# Patient Record
Sex: Male | Born: 1990 | Race: White | Hispanic: No | Smoking: Current every day smoker
Health system: Southern US, Community
[De-identification: ages and names within clinical notes are randomized; demographics above are authoritative.]

## PROBLEM LIST (undated history)

## (undated) VITALS — BP 128/68 | HR 48 | Temp 97.6°F | Resp 16 | Ht 63.0 in | Wt 173.0 lb

## (undated) DIAGNOSIS — F419 Anxiety disorder, unspecified: Secondary | ICD-10-CM

## (undated) DIAGNOSIS — F32A Depression, unspecified: Secondary | ICD-10-CM

## (undated) DIAGNOSIS — Z915 Personal history of self-harm: Secondary | ICD-10-CM

## (undated) DIAGNOSIS — Z9151 Personal history of suicidal behavior: Secondary | ICD-10-CM

## (undated) DIAGNOSIS — Z5900 Homelessness unspecified: Secondary | ICD-10-CM

## (undated) DIAGNOSIS — T1491XA Suicide attempt, initial encounter: Secondary | ICD-10-CM

## (undated) DIAGNOSIS — F329 Major depressive disorder, single episode, unspecified: Secondary | ICD-10-CM

## (undated) HISTORY — PX: NO PAST SURGERIES: SHX2092

---
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
## 2010-01-20 ENCOUNTER — Emergency Department (HOSPITAL_COMMUNITY): Admission: EM | Admit: 2010-01-20 | Discharge: 2010-01-20 | Payer: Self-pay | Admitting: Emergency Medicine

## 2010-02-03 ENCOUNTER — Emergency Department (HOSPITAL_COMMUNITY): Admission: EM | Admit: 2010-02-03 | Discharge: 2010-02-03 | Payer: Self-pay | Admitting: Emergency Medicine

## 2010-03-08 ENCOUNTER — Inpatient Hospital Stay (HOSPITAL_COMMUNITY): Admission: EM | Admit: 2010-03-08 | Discharge: 2010-03-10 | Payer: Self-pay | Admitting: Emergency Medicine

## 2010-03-10 ENCOUNTER — Ambulatory Visit: Payer: Self-pay | Admitting: Psychiatry

## 2010-03-23 ENCOUNTER — Emergency Department (HOSPITAL_COMMUNITY): Admission: EM | Admit: 2010-03-23 | Discharge: 2010-03-23 | Payer: Self-pay | Admitting: Emergency Medicine

## 2010-04-30 ENCOUNTER — Emergency Department: Payer: Self-pay | Admitting: Emergency Medicine

## 2010-07-03 ENCOUNTER — Emergency Department: Payer: Self-pay | Admitting: Emergency Medicine

## 2011-01-08 LAB — DIFFERENTIAL
Basophils Absolute: 0 10*3/uL (ref 0.0–0.1)
Basophils Absolute: 0 10*3/uL (ref 0.0–0.1)
Basophils Absolute: 0 10*3/uL (ref 0.0–0.1)
Basophils Relative: 0 % (ref 0–1)
Basophils Relative: 0 % (ref 0–1)
Eosinophils Absolute: 0 10*3/uL (ref 0.0–0.7)
Eosinophils Relative: 0 % (ref 0–5)
Eosinophils Relative: 0 % (ref 0–5)
Lymphocytes Relative: 17 % (ref 12–46)
Lymphocytes Relative: 9 % — ABNORMAL LOW (ref 12–46)
Lymphs Abs: 1.9 10*3/uL (ref 0.7–4.0)
Lymphs Abs: 0.8 10*3/uL (ref 0.7–4.0)
Monocytes Absolute: 0.8 10*3/uL (ref 0.1–1.0)
Neutro Abs: 3.3 10*3/uL (ref 1.7–7.7)
Neutro Abs: 8.2 10*3/uL — ABNORMAL HIGH (ref 1.7–7.7)
Neutrophils Relative %: 86 % — ABNORMAL HIGH (ref 43–77)

## 2011-01-08 LAB — CBC
HCT: 40.3 % (ref 39.0–52.0)
HCT: 40.3 % (ref 39.0–52.0)
MCHC: 33.1 g/dL (ref 30.0–36.0)
MCHC: 33.3 g/dL (ref 30.0–36.0)
MCV: 88.4 fL (ref 78.0–100.0)
MCV: 88.6 fL (ref 78.0–100.0)
MCV: 89.2 fL (ref 78.0–100.0)
Platelets: 118 10*3/uL — ABNORMAL LOW (ref 150–400)
Platelets: 134 10*3/uL — ABNORMAL LOW (ref 150–400)
RBC: 4.56 MIL/uL (ref 4.22–5.81)
RDW: 13.7 % (ref 11.5–15.5)
WBC: 5.9 10*3/uL (ref 4.0–10.5)
WBC: 5.1 10*3/uL (ref 4.0–10.5)
WBC: 6.6 10*3/uL (ref 4.0–10.5)
WBC: 9.5 10*3/uL (ref 4.0–10.5)

## 2011-01-08 LAB — URINALYSIS, ROUTINE W REFLEX MICROSCOPIC
Bilirubin Urine: NEGATIVE
Glucose, UA: NEGATIVE mg/dL
Glucose, UA: NEGATIVE mg/dL
Hgb urine dipstick: NEGATIVE
Hgb urine dipstick: NEGATIVE
Leukocytes, UA: NEGATIVE
Protein, ur: 30 mg/dL — AB
Specific Gravity, Urine: 1.013 (ref 1.005–1.030)
Urobilinogen, UA: 0.2 mg/dL (ref 0.0–1.0)
pH: 6.5 (ref 5.0–8.0)

## 2011-01-08 LAB — BASIC METABOLIC PANEL
BUN: 6 mg/dL (ref 6–23)
Chloride: 105 mEq/L (ref 96–112)
Creatinine, Ser: 0.8 mg/dL (ref 0.4–1.5)

## 2011-01-08 LAB — RAPID URINE DRUG SCREEN, HOSP PERFORMED
Amphetamines: NOT DETECTED
Amphetamines: NOT DETECTED
Barbiturates: NOT DETECTED
Barbiturates: NOT DETECTED
Benzodiazepines: NOT DETECTED
Benzodiazepines: NOT DETECTED
Opiates: NOT DETECTED
Opiates: NOT DETECTED

## 2011-01-08 LAB — COMPREHENSIVE METABOLIC PANEL
ALT: 22 U/L (ref 0–53)
AST: 23 U/L (ref 0–37)
AST: 19 U/L (ref 0–37)
AST: 27 U/L (ref 0–37)
Albumin: 4.1 g/dL (ref 3.5–5.2)
Albumin: 2.9 g/dL — ABNORMAL LOW (ref 3.5–5.2)
Alkaline Phosphatase: 77 U/L (ref 39–117)
Alkaline Phosphatase: 47 U/L (ref 39–117)
BUN: 14 mg/dL (ref 6–23)
CO2: 26 mEq/L (ref 19–32)
Calcium: 9.1 mg/dL (ref 8.4–10.5)
Chloride: 104 mEq/L (ref 96–112)
Chloride: 102 mEq/L (ref 96–112)
Chloride: 99 mEq/L (ref 96–112)
Creatinine, Ser: 0.86 mg/dL (ref 0.4–1.5)
GFR calc Af Amer: >60 mL/min (ref >60)
GFR calc Af Amer: >60 mL/min (ref >60)
GFR calc Af Amer: >60 mL/min (ref >60)
GFR calc non Af Amer: >60 mL/min (ref >60)
Glucose, Bld: 134 mg/dL — ABNORMAL HIGH (ref 70–99)
Potassium: 4.3 mEq/L (ref 3.5–5.1)
Potassium: 3.8 mEq/L (ref 3.5–5.1)
Sodium: 140 mEq/L (ref 135–145)
Sodium: 137 mEq/L (ref 135–145)
Total Bilirubin: 0.8 mg/dL (ref 0.3–1.2)
Total Bilirubin: 0.6 mg/dL (ref 0.3–1.2)
Total Bilirubin: 0.7 mg/dL (ref 0.3–1.2)
Total Protein: 7.3 g/dL (ref 6.0–8.3)

## 2011-01-08 LAB — ETHANOL: Alcohol, Ethyl (B): <5 mg/dL (ref 0–10)

## 2011-01-08 LAB — CULTURE, BLOOD (ROUTINE X 2): Culture: NO GROWTH

## 2011-01-08 LAB — VALPROIC ACID LEVEL: Valproic Acid Lvl: 86.1 ug/mL (ref 50.0–100.0)

## 2011-01-08 LAB — URINE CULTURE

## 2011-01-08 LAB — HEPATITIS PANEL, ACUTE
HCV Ab: NEGATIVE
Hep A IgM: NEGATIVE
Hepatitis B Surface Ag: NEGATIVE

## 2011-01-08 LAB — URINE MICROSCOPIC-ADD ON

## 2011-03-11 ENCOUNTER — Emergency Department (HOSPITAL_COMMUNITY)
Admission: EM | Admit: 2011-03-11 | Discharge: 2011-03-11 | Disposition: A | Discharge status: Home / Self Care | Payer: Self-pay | Attending: Emergency Medicine | Admitting: Emergency Medicine

## 2011-03-11 ENCOUNTER — Emergency Department (HOSPITAL_COMMUNITY): Payer: Self-pay

## 2011-03-11 DIAGNOSIS — Y92838 Other recreation area as the place of occurrence of the external cause: Secondary | ICD-10-CM | POA: Insufficient documentation

## 2011-03-11 DIAGNOSIS — Y9239 Other specified sports and athletic area as the place of occurrence of the external cause: Secondary | ICD-10-CM | POA: Insufficient documentation

## 2011-03-11 DIAGNOSIS — X58XXXA Exposure to other specified factors, initial encounter: Secondary | ICD-10-CM | POA: Insufficient documentation

## 2011-03-11 DIAGNOSIS — Y9366 Activity, soccer: Secondary | ICD-10-CM | POA: Insufficient documentation

## 2011-03-11 DIAGNOSIS — IMO0002 Reserved for concepts with insufficient information to code with codable children: Secondary | ICD-10-CM | POA: Insufficient documentation

## 2011-09-24 IMAGING — CT CT ABD-PELV W/ CM
1 series · 15 of 32 positions shown, 19 images · IV contrast (agent unspecified)
Comparison: None

CLINICAL DATA: Right abdominal pain with nausea and vomiting

CT ABDOMEN AND PELVIS WITH CONTRAST
TECHNIQUE: Multidetector CT imaging of the abdomen and pelvis was
performed following the standard protocol during bolus
administration of intravenous contrast.
Contrast:  100 ml Lmnipaque-AZZ

[Series 2: rtn ap with st · axial · 0.62mm/px · z∈[+749,+1189]mm · 15 of 99 slices shown, 19 images]
[im 7/99  soft-tissue]
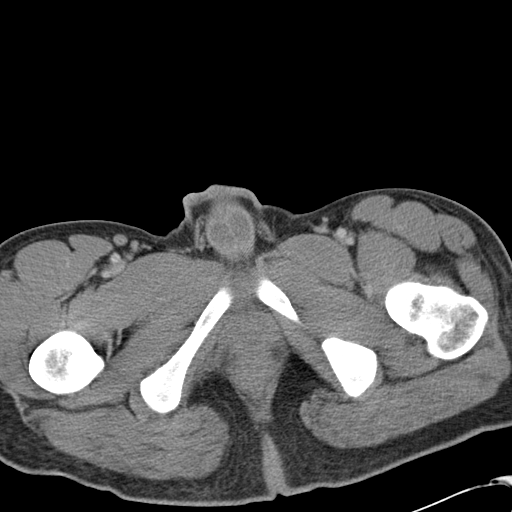
[im 7/99  bone]
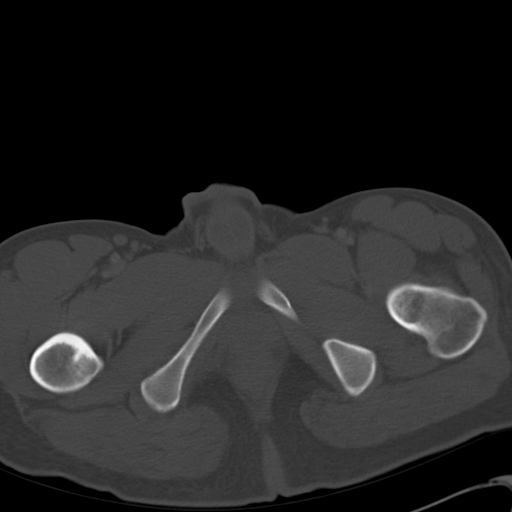
[im 13/99  soft-tissue]
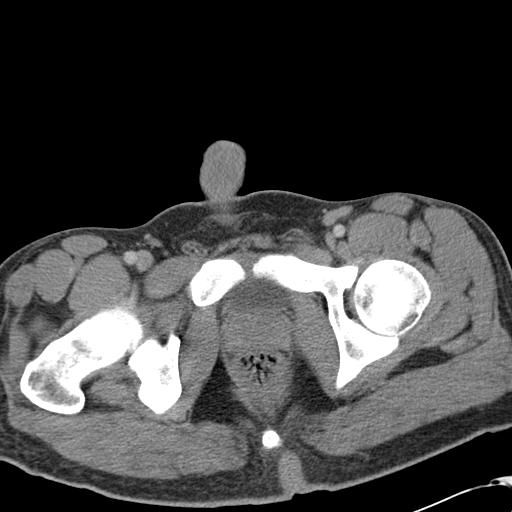
[im 19/99  soft-tissue]
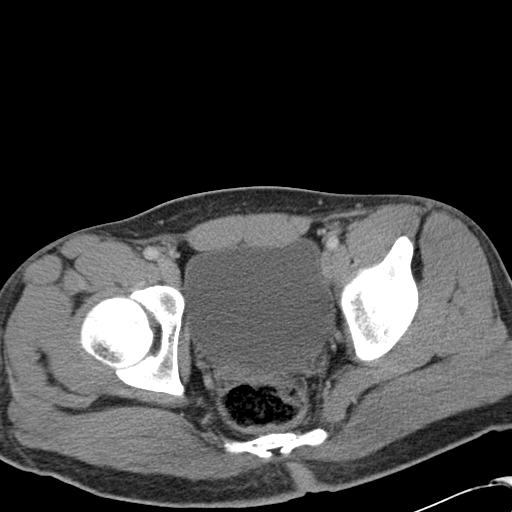
[im 29/99  soft-tissue]
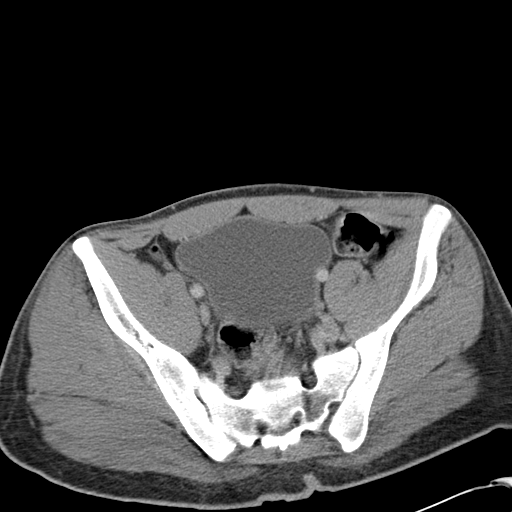
[im 35/99  soft-tissue]
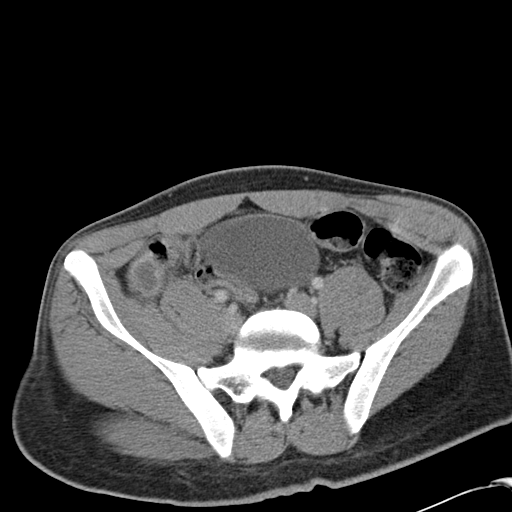
[im 42/99  soft-tissue]
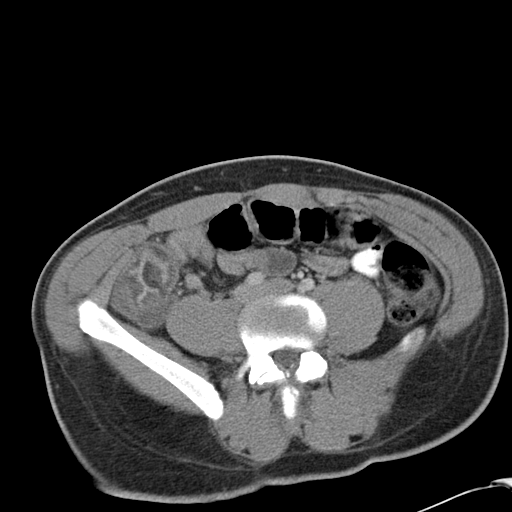
[im 51/99  soft-tissue]
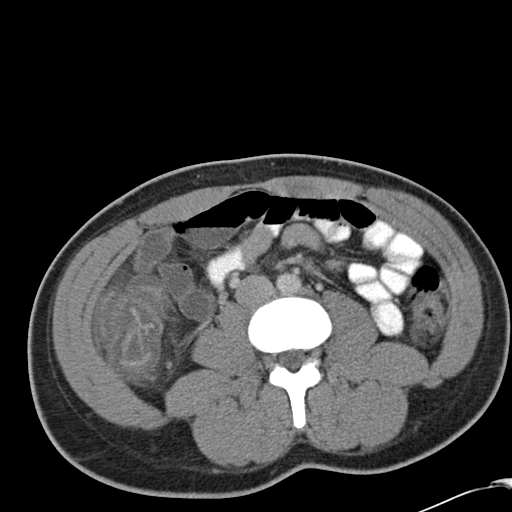
[im 57/99  soft-tissue]
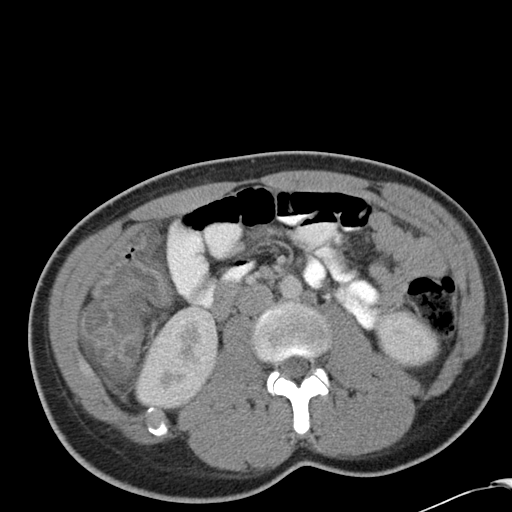
[im 64/99  soft-tissue]
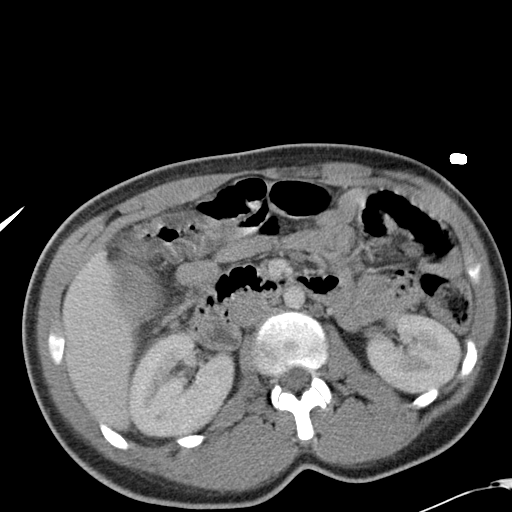
[im 64/99  bone]
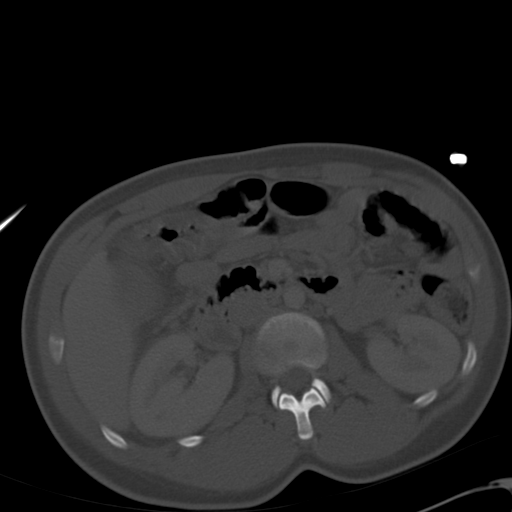
[im 70/99  soft-tissue]
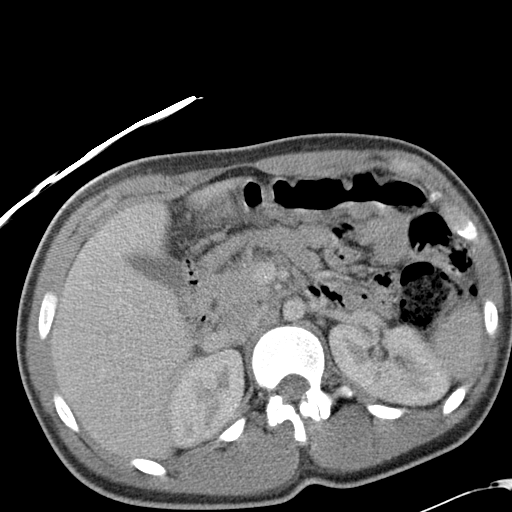
[im 80/99  soft-tissue]
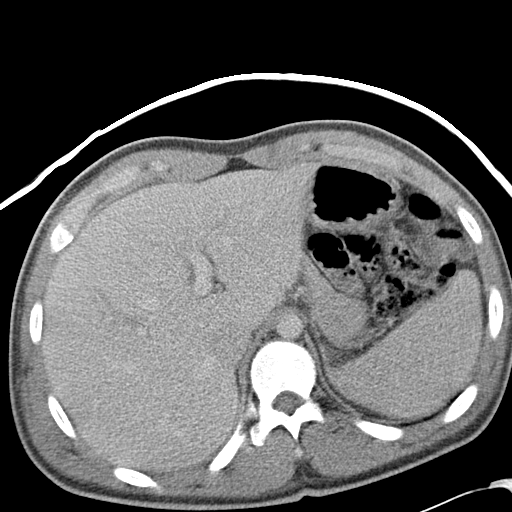
[im 86/99  soft-tissue]
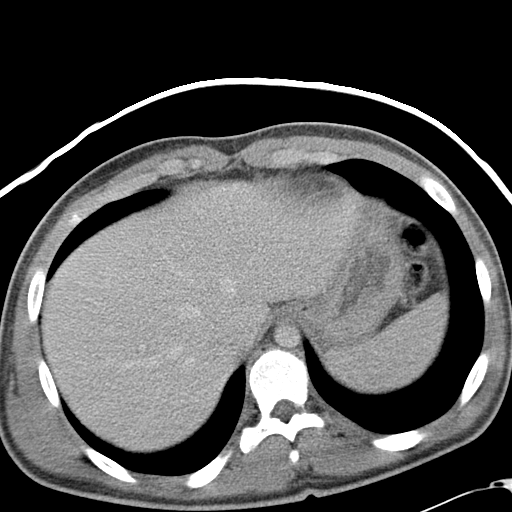
[im 86/99  lung]
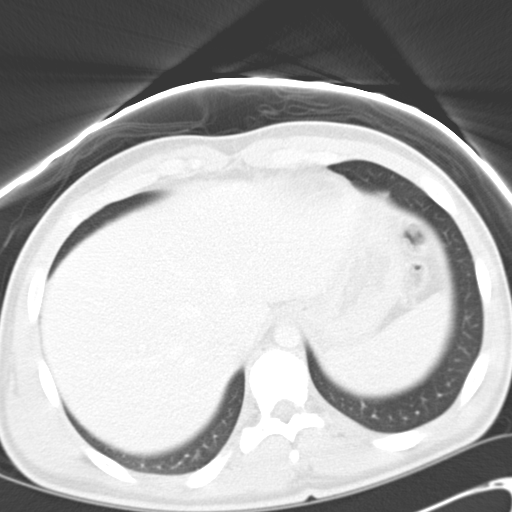
[im 89/99  lung]
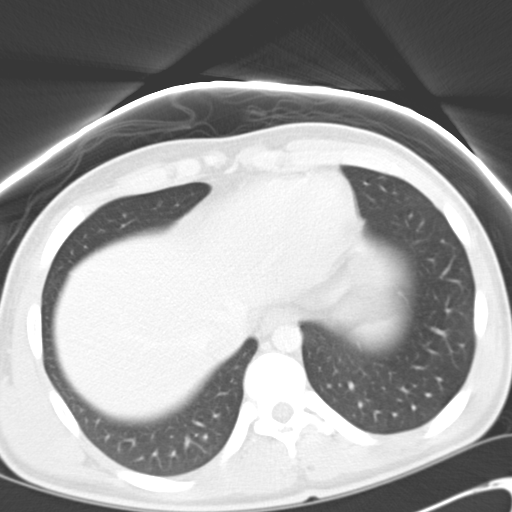
[im 92/99  soft-tissue]
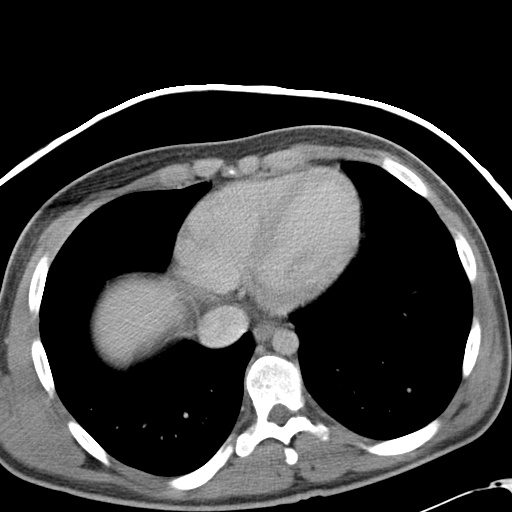
[im 92/99  lung]
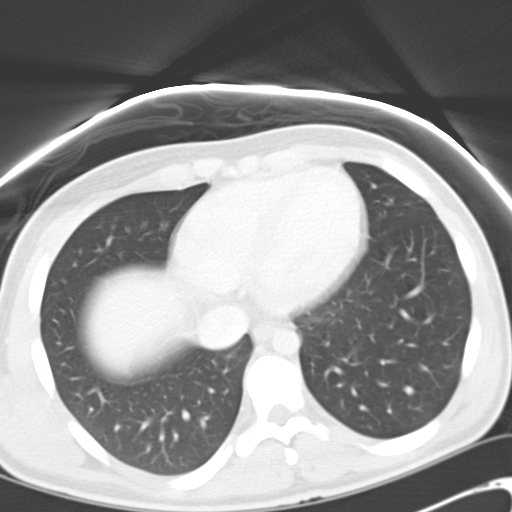
[im 95/99  lung]
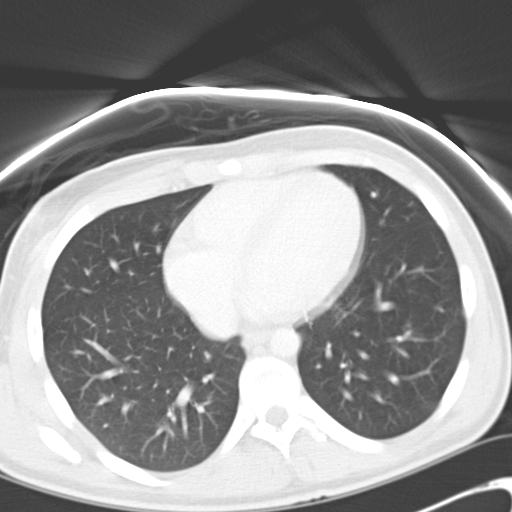

[15 of 32 positions shown; findings below may reference images not displayed]

FINDINGS: The lung bases are clear.  Liver, spleen, pancreas, and
adrenal glands normal.  No ascites or adenopathy.  No masses or
fluid collections.

There is striking inflammatory change of the cecum and ascending
colon all the way up to the hepatic flexure extending into the
proximal transverse colon. At the hepatic flexure, there  appears
to be colonic/colonic intussusception.  It is possible that this is
a transient phenomenon, being lead by the markedly inflamed right
colon. At this time there is no evidence for obstruction of the
bowel. No definite inflammatory changes elsewhere in the colon.
The small bowel shows no definite inflammatory change.  The most
likely diagnosis is regional enteritis or ulcerative colitis.  I do
not see the appendix with certainty.

No free pelvic fluid or abscess.  No free air.  Osseous structures
intact.
IMPRESSION: 1.  Marked inflammatory changes of the right colon and proximal
transverse colon, with intussusception at the level of the hepatic
flexure. This appears to be causing no obstruction, and may be a
transit and phenomenon.  See report.
2.  No other acute or specific findings.
3.  The appendix is not identified with certainty.

## 2012-06-07 ENCOUNTER — Emergency Department (HOSPITAL_COMMUNITY): Payer: Medicaid Other

## 2012-06-07 ENCOUNTER — Encounter (HOSPITAL_COMMUNITY): Payer: Self-pay | Admitting: Emergency Medicine

## 2012-06-07 ENCOUNTER — Emergency Department (HOSPITAL_COMMUNITY)
Admission: EM | Admit: 2012-06-07 | Discharge: 2012-06-08 | Disposition: A | Discharge status: Home / Self Care | Payer: Medicaid Other | Attending: Emergency Medicine | Admitting: Emergency Medicine

## 2012-06-07 DIAGNOSIS — F17200 Nicotine dependence, unspecified, uncomplicated: Secondary | ICD-10-CM | POA: Insufficient documentation

## 2012-06-07 DIAGNOSIS — IMO0002 Reserved for concepts with insufficient information to code with codable children: Secondary | ICD-10-CM | POA: Insufficient documentation

## 2012-06-07 DIAGNOSIS — S46919A Strain of unspecified muscle, fascia and tendon at shoulder and upper arm level, unspecified arm, initial encounter: Secondary | ICD-10-CM
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
## 2012-06-08 ENCOUNTER — Emergency Department (HOSPITAL_COMMUNITY): Payer: Medicaid Other

## 2012-06-08 ENCOUNTER — Emergency Department (HOSPITAL_COMMUNITY)
Admission: EM | Admit: 2012-06-08 | Discharge: 2012-06-08 | Disposition: A | Discharge status: Home / Self Care | Payer: Medicaid Other | Attending: Emergency Medicine | Admitting: Emergency Medicine

## 2012-06-08 ENCOUNTER — Encounter (HOSPITAL_COMMUNITY): Payer: Self-pay | Admitting: *Deleted

## 2012-06-08 DIAGNOSIS — M25519 Pain in unspecified shoulder: Secondary | ICD-10-CM

## 2012-06-08 DIAGNOSIS — R42 Dizziness and giddiness: Secondary | ICD-10-CM | POA: Insufficient documentation

## 2012-06-08 DIAGNOSIS — R51 Headache: Secondary | ICD-10-CM | POA: Insufficient documentation

## 2012-06-08 DIAGNOSIS — S0990XA Unspecified injury of head, initial encounter: Secondary | ICD-10-CM | POA: Insufficient documentation

## 2012-06-08 DIAGNOSIS — J45909 Unspecified asthma, uncomplicated: Secondary | ICD-10-CM | POA: Insufficient documentation

## 2012-06-08 DIAGNOSIS — F17200 Nicotine dependence, unspecified, uncomplicated: Secondary | ICD-10-CM | POA: Insufficient documentation

## 2012-06-08 DIAGNOSIS — R404 Transient alteration of awareness: Secondary | ICD-10-CM | POA: Insufficient documentation

## 2012-06-08 DIAGNOSIS — M25529 Pain in unspecified elbow: Secondary | ICD-10-CM | POA: Insufficient documentation

## 2012-06-08 MED ORDER — HYDROCODONE-ACETAMINOPHEN 5-325 MG PO TABS
1.0000 | ORAL_TABLET | Freq: Once | ORAL | Status: AC
  Administered 2012-06-08: 1 via ORAL
  Filled 2012-06-08: qty 1

## 2012-06-08 MED ORDER — NAPROXEN 500 MG PO TABS: 500.0000 mg | ORAL_TABLET | Freq: Two times a day (BID) | ORAL | Status: DC

## 2012-06-08 MED ORDER — HYDROCODONE-ACETAMINOPHEN 5-325 MG PO TABS: 1.0000 | ORAL_TABLET | ORAL | Status: DC | PRN

## 2012-06-08 MED ORDER — IBUPROFEN 800 MG PO TABS: 800.0000 mg | ORAL_TABLET | Freq: Three times a day (TID) | ORAL | Status: DC | PRN

## 2012-06-08 MED ORDER — KETOROLAC TROMETHAMINE 60 MG/2ML IM SOLN
60.0000 mg | Freq: Once | INTRAMUSCULAR | Status: AC
  Administered 2012-06-08: 60 mg via INTRAMUSCULAR
  Filled 2012-06-08: qty 2

## 2012-06-08 NOTE — ED Provider Notes (Signed)
Medical screening examination/treatment/procedure(s) were performed by non-physician practitioner and as supervising physician I was immediately available for consultation/collaboration.  Ayaan Shutes, MD 06/08/12 0716 
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
## 2012-06-08 NOTE — ED Notes (Signed)
unable to perform secondary assessmentt at this time, pt. unable to stay awake. Pt. Is responsive to verbal ad pain stimuli.
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
## 2012-06-09 NOTE — ED Provider Notes (Signed)
Medical screening examination/treatment/procedure(s) were performed by non-physician practitioner and as supervising physician I was immediately available for consultation/collaboration.  Yajaira Doffing K Linker, MD 06/09/12 0915 

## 2012-06-10 ENCOUNTER — Inpatient Hospital Stay (HOSPITAL_COMMUNITY)
Admission: AD | Admit: 2012-06-10 | Discharge: 2012-06-16 | DRG: 885 | Disposition: A | Discharge status: Home / Self Care | Payer: Medicaid Other | Source: Ambulatory Visit | Attending: Psychiatry | Admitting: Psychiatry

## 2012-06-10 ENCOUNTER — Encounter (HOSPITAL_COMMUNITY): Payer: Self-pay | Admitting: Behavioral Health

## 2012-06-10 ENCOUNTER — Encounter (HOSPITAL_COMMUNITY): Payer: Self-pay | Admitting: Emergency Medicine

## 2012-06-10 ENCOUNTER — Emergency Department (HOSPITAL_COMMUNITY)
Admission: EM | Admit: 2012-06-10 | Discharge: 2012-06-10 | Disposition: A | Discharge status: Other Acute Inpatient Hospital | Payer: Medicaid Other | Attending: Emergency Medicine | Admitting: Emergency Medicine

## 2012-06-10 DIAGNOSIS — F3130 Bipolar disorder, current episode depressed, mild or moderate severity, unspecified: Principal | ICD-10-CM | POA: Diagnosis present

## 2012-06-10 DIAGNOSIS — F1210 Cannabis abuse, uncomplicated: Secondary | ICD-10-CM

## 2012-06-10 DIAGNOSIS — F319 Bipolar disorder, unspecified: Secondary | ICD-10-CM

## 2012-06-10 DIAGNOSIS — J45909 Unspecified asthma, uncomplicated: Secondary | ICD-10-CM | POA: Insufficient documentation

## 2012-06-10 DIAGNOSIS — S42009A Fracture of unspecified part of unspecified clavicle, initial encounter for closed fracture: Secondary | ICD-10-CM | POA: Diagnosis present

## 2012-06-10 DIAGNOSIS — R45850 Homicidal ideations: Secondary | ICD-10-CM

## 2012-06-10 DIAGNOSIS — F209 Schizophrenia, unspecified: Secondary | ICD-10-CM | POA: Insufficient documentation

## 2012-06-10 DIAGNOSIS — R45851 Suicidal ideations: Secondary | ICD-10-CM | POA: Insufficient documentation

## 2012-06-10 DIAGNOSIS — Z8659 Personal history of other mental and behavioral disorders: Secondary | ICD-10-CM | POA: Insufficient documentation

## 2012-06-10 LAB — COMPREHENSIVE METABOLIC PANEL WITH GFR
ALT: 30 U/L (ref 0–53)
AST: 33 U/L (ref 0–37)
Albumin: 3.8 g/dL (ref 3.5–5.2)
Alkaline Phosphatase: 76 U/L (ref 39–117)
BUN: 13 mg/dL (ref 6–23)
CO2: 30 meq/L (ref 19–32)
Calcium: 9.7 mg/dL (ref 8.4–10.5)
Chloride: 101 meq/L (ref 96–112)
Creatinine, Ser: 0.71 mg/dL (ref 0.50–1.35)
GFR calc Af Amer: >90 mL/min
GFR calc non Af Amer: >90 mL/min
Glucose, Bld: 137 mg/dL — ABNORMAL HIGH (ref 70–99)
Potassium: 3.5 meq/L (ref 3.5–5.1)
Sodium: 140 meq/L (ref 135–145)
Total Bilirubin: 0.6 mg/dL (ref 0.3–1.2)
Total Protein: 6.9 g/dL (ref 6.0–8.3)

## 2012-06-10 LAB — CBC
HCT: 36.3 % — ABNORMAL LOW (ref 39.0–52.0)
Platelets: 200 10*3/uL (ref 150–400)
RDW: 12.8 % (ref 11.5–15.5)
WBC: 6.8 10*3/uL (ref 4.0–10.5)

## 2012-06-10 LAB — ETHANOL: Alcohol, Ethyl (B): <11 mg/dL (ref 0–11)

## 2012-06-10 LAB — RAPID URINE DRUG SCREEN, HOSP PERFORMED
Amphetamines: NOT DETECTED
Tetrahydrocannabinol: POSITIVE — AB

## 2012-06-10 MED ORDER — ACETAMINOPHEN 325 MG PO TABS: 650.0000 mg | ORAL_TABLET | Freq: Four times a day (QID) | ORAL | Status: DC | PRN

## 2012-06-10 MED ORDER — TRAZODONE HCL 50 MG PO TABS
50.0000 mg | ORAL_TABLET | Freq: Every evening | ORAL | Status: DC | PRN
  Filled 2012-06-10: qty 1

## 2012-06-10 MED ORDER — IBUPROFEN 200 MG PO TABS
600.0000 mg | ORAL_TABLET | Freq: Four times a day (QID) | ORAL | Status: DC | PRN
Start: 2012-06-10 — End: 2012-06-10

## 2012-06-10 MED ORDER — CHLORDIAZEPOXIDE HCL 25 MG PO CAPS: 25.0000 mg | ORAL_CAPSULE | Freq: Four times a day (QID) | ORAL | Status: DC | PRN

## 2012-06-10 MED ORDER — NICOTINE 21 MG/24HR TD PT24: 21.0000 mg | MEDICATED_PATCH | Freq: Every day | TRANSDERMAL | Status: DC

## 2012-06-10 MED ORDER — HYDROCODONE-ACETAMINOPHEN 5-325 MG PO TABS
1.0000 | ORAL_TABLET | Freq: Once | ORAL | Status: DC
  Filled 2012-06-10: qty 1

## 2012-06-10 MED ORDER — MAGNESIUM HYDROXIDE 400 MG/5ML PO SUSP: 30.0000 mL | Freq: Every day | ORAL | Status: DC | PRN

## 2012-06-10 MED ORDER — ALUM & MAG HYDROXIDE-SIMETH 200-200-20 MG/5ML PO SUSP: 30.0000 mL | ORAL | Status: DC | PRN

## 2012-06-10 MED ORDER — ACETAMINOPHEN 325 MG PO TABS: 650.0000 mg | ORAL_TABLET | ORAL | Status: DC | PRN

## 2012-06-10 MED ORDER — IBUPROFEN 200 MG PO TABS: 600.0000 mg | ORAL_TABLET | Freq: Three times a day (TID) | ORAL | Status: DC | PRN
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
## 2012-06-10 NOTE — BH Assessment (Signed)
Assessment Note   [name] is an 21 y.o. male that presents to [facility] after his friend dropped him off by report. Patient states he was going to try to hang himself in the woods from a tree with a sheet and a friend stopped him. Pt continues to endorse SI and has had a previous attempt as a teen due to depression by report.  Patient states he broke his right shoulder on Sunday and is having severe pain in that shoulder. Patient states he was in jail for a month for misdemeanor larceny.  Once released Wednesday, his girlfriend told him he could not come back to their house and she is seeing someone else. Pt stated he has been homeless since then and missed their child they have together.  Patient stated he is sad about the loss of his girlfriend and child and states he just wanted to "end it all."  Pt denies HI or SA, although his UDS is positive for THC and benzodiazapines.  Pt denies psychosis, although states he has heard voices int he past.  Pt stated he has had outpatient treatment in the past.  Pt was last at [facility] in 2012, but stopped going and taking his medications because he didn't feel like they were helping.  Pt received telepsych that recommended pt be place inpatient.  Completed assessment, assessment notification and faxed to [facility] to run for possible admission.  ED staff updated.    Axis I: Bipolar, Depressed Axis II: Deferred Axis III:  Past Medical History  Diagnosis Date  . Asthma   . Bipolar depression   . Schizophrenia    Axis IV: economic problems, housing problems, occupational problems, other psychosocial or environmental problems, problems related to legal system/crime and problems with primary support group Axis V: 21-30 behavior considerably influenced by delusions or hallucinations OR serious impairment in judgment, communication OR inability to function in almost all areas  Past Medical History:  Past Medical History  Diagnosis Date  . Asthma   . Bipolar  depression   . Schizophrenia     History reviewed. No pertinent past surgical history.  Family History: History reviewed. No pertinent family history.  Social History:  reports that he has been smoking.  He does not have any smokeless tobacco history on file. He reports that he does not drink alcohol or use illicit drugs.  Additional Social History:  Alcohol / Drug Use Pain Medications: none Prescriptions: none Over the Counter: none History of alcohol / drug use?: No history of alcohol / drug abuse (pt denies) Longest period of sobriety (when/how long): unknown Negative Consequences of Use:  (pt denies) Withdrawal Symptoms:  (pt denies)  CIWA: CIWA-Ar BP: 132/47 mmHg Pulse Rate: 60  COWS: Clinical Opiate Withdrawal Scale (COWS) Resting Pulse Rate: Pulse Rate 80 or below Sweating: No report of chills or flushing Restlessness: Able to sit still Pupil Size: Pupils pinned or normal size for room light Bone or Joint Aches: Not present Runny Nose or Tearing: Not present GI Upset: No GI symptoms Tremor: No tremor Yawning: No yawning Anxiety or Irritability: Patient reports increasing irritability or anxiousness Gooseflesh Skin: Skin is smooth COWS Total Score: 1   Allergies: No Known Allergies  Home Medications:  (Not in a hospital admission)  OB/GYN Status:  No LMP for male patient.  General Assessment Data Location of Assessment: [facility] ED Living Arrangements: Other (Comment) (Homeless) Can pt return to current living arrangement?: Yes Admission Status: Voluntary Is patient capable of signing voluntary admission?:  Yes Transfer from: Acute Hospital Referral Source: Self/Family/Friend  Education Status Is patient currently in school?: No Highest grade of school patient has completed: 49 Name of school: [employer]  Risk to self Suicidal Ideation: Yes-Currently Present Suicidal Intent: Yes-Currently Present Is patient at risk for suicide?: Yes Suicidal Plan?:  Yes-Currently Present Specify Current Suicidal Plan: Pt stated he tried to hang himself with a sheet Access to Means: Yes Specify Access to Suicidal Means: pt had access to sheet What has been your use of drugs/alcohol within the last 12 months?: pt denies current use Previous Attempts/Gestures: Yes How many times?: 1  (At age 61, tried to hang self with sheet by report) Other Self Harm Risks: pt denies Triggers for Past Attempts: Other (Comment) (Depression by report) Intentional Self Injurious Behavior: None Family Suicide History: No Recent stressful life event(s): Loss (Comment);Legal Issues;Other (Comment) (Recently servied time in jail, break up with girlfriend, hom) Persecutory voices/beliefs?: No Depression: Yes Depression Symptoms: Despondent;Insomnia;Tearfulness;Isolating;Loss of interest in usual pleasures;Feeling worthless/self pity Substance abuse history and/or treatment for substance abuse?: No Suicide prevention information given to non-admitted patients: Not applicable  Risk to Others Homicidal Ideation: No Thoughts of Harm to Others: No Current Homicidal Intent: No Current Homicidal Plan: No Access to Homicidal Means: No Identified Victim: na History of harm to others?: No Assessment of Violence: None Noted Violent Behavior Description: na - pt calm, cooperative Does patient have access to weapons?: No Criminal Charges Pending?: No Does patient have a court date: Yes Court Date: 07/12/12 (continuation of previous court date for failure to appear)  Psychosis Hallucinations: None noted Delusions: None noted  Mental Status Report Appear/Hygiene: Disheveled Eye Contact: Good Motor Activity: Unremarkable Speech: Logical/coherent Level of Consciousness: Alert Mood: Depressed Affect: Appropriate to circumstance Anxiety Level: None Thought Processes: Coherent;Relevant Judgement: Unimpaired Orientation: Person;Place;Time;Situation Obsessive Compulsive  Thoughts/Behaviors: None  Cognitive Functioning Concentration: Decreased Memory: Recent Intact;Remote Intact IQ: Average Insight: Poor Impulse Control: Fair Appetite: Good Weight Loss: 0  Weight Gain: 0  Sleep: Decreased Total Hours of Sleep:  (1-2 hrs per night) Vegetative Symptoms: Decreased grooming  ADLScreening [facility] Assessment Services) Patient's cognitive ability adequate to safely complete daily activities?: Yes Patient able to express need for assistance with ADLs?: Yes Independently performs ADLs?: Yes (appropriate for developmental age)  Abuse/Neglect [facility]) Physical Abuse: Denies Verbal Abuse: Denies Sexual Abuse: Denies  Prior Inpatient Therapy Prior Inpatient Therapy: No Prior Therapy Dates: na Prior Therapy Facilty/Provider(s): na Reason for Treatment: na  Prior Outpatient Therapy Prior Outpatient Therapy: Yes Prior Therapy Dates: At age 88 and in 2012 Prior Therapy Facilty/Provider(s): Catering manager (unknown name), [facility]er Reason for Treatment: Bipolar Disorder  ADL Screening (condition at time of admission) Patient's cognitive ability adequate to safely complete daily activities?: Yes Patient able to express need for assistance with ADLs?: Yes Independently performs ADLs?: Yes (appropriate for developmental age) Weakness of Legs: None Weakness of Arms/Hands: None  Home Assistive Devices/Equipment Home Assistive Devices/Equipment: None    Abuse/Neglect Assessment (Assessment to be complete while patient is alone) Physical Abuse: Denies Verbal Abuse: Denies Sexual Abuse: Denies Exploitation of patient/patient's resources: Denies Self-Neglect: Denies Values / Beliefs Cultural Requests During Hospitalization: None Spiritual Requests During Hospitalization: None Consults Spiritual Care Consult Needed: No Social Work Consult Needed: No Merchant navy officer (For Healthcare) Advance Directive: Patient does not have advance directive;Patient would  not like information    Additional Information 1:1 In Past 12 Months?: No CIRT Risk: No Elopement Risk: No Does patient have medical clearance?: Yes  Disposition:  Disposition Disposition of Patient: Referred to;Inpatient treatment program Type of inpatient treatment program: Adult Patient referred to: Other (Comment) (Pending [facility])  On Site Evaluation by:   Reviewed with Physician:  Valene Bors, Rennis Harding 06/10/2012 6:05 PM

## 2012-06-10 NOTE — ED Notes (Signed)
Meal tray and drink given to pt.

## 2012-06-10 NOTE — ED Notes (Signed)
Called report to [facility].  Pt going to room C22.

## 2012-06-10 NOTE — ED Notes (Signed)
[name] with ACT team at bedside.  

## 2012-06-10 NOTE — ED Notes (Signed)
Sitter ordered.  Lab at bedside.

## 2012-06-11 ENCOUNTER — Encounter (HOSPITAL_COMMUNITY): Payer: Self-pay | Admitting: Physician Assistant

## 2012-06-11 DIAGNOSIS — F1210 Cannabis abuse, uncomplicated: Secondary | ICD-10-CM | POA: Diagnosis present

## 2012-06-11 DIAGNOSIS — R45850 Homicidal ideations: Secondary | ICD-10-CM

## 2012-06-11 DIAGNOSIS — J45909 Unspecified asthma, uncomplicated: Secondary | ICD-10-CM | POA: Insufficient documentation

## 2012-06-11 DIAGNOSIS — R45851 Suicidal ideations: Secondary | ICD-10-CM

## 2012-06-11 DIAGNOSIS — F3110 Bipolar disorder, current episode manic without psychotic features, unspecified: Secondary | ICD-10-CM

## 2012-06-11 DIAGNOSIS — F259 Schizoaffective disorder, unspecified: Secondary | ICD-10-CM

## 2012-06-11 MED ORDER — IBUPROFEN 600 MG PO TABS: 600.0000 mg | ORAL_TABLET | Freq: Three times a day (TID) | ORAL | Status: DC

## 2012-06-11 MED ORDER — OXYCODONE HCL 5 MG PO TABS
5.0000 mg | ORAL_TABLET | Freq: Four times a day (QID) | ORAL | Status: AC | PRN
  Administered 2012-06-11 – 2012-06-13 (×5): 5 mg via ORAL
  Filled 2012-06-11 (×4): qty 1

## 2012-06-11 MED ORDER — TRAZODONE HCL 100 MG PO TABS
100.0000 mg | ORAL_TABLET | Freq: Every evening | ORAL | Status: DC | PRN
  Administered 2012-06-11 – 2012-06-15 (×10): 100 mg via ORAL
  Filled 2012-06-11 (×14): qty 1

## 2012-06-11 MED ORDER — ALUM & MAG HYDROXIDE-SIMETH 200-200-20 MG/5ML PO SUSP: 30.0000 mL | ORAL | Status: DC | PRN

## 2012-06-11 MED ORDER — OXYCODONE HCL 5 MG PO TABS
10.0000 mg | ORAL_TABLET | ORAL | Status: AC
  Administered 2012-06-11: 10 mg via ORAL
  Filled 2012-06-11: qty 2

## 2012-06-11 MED ORDER — ACETAMINOPHEN 325 MG PO TABS: 650.0000 mg | ORAL_TABLET | Freq: Four times a day (QID) | ORAL | Status: DC | PRN

## 2012-06-11 MED ORDER — DIVALPROEX SODIUM ER 500 MG PO TB24
500.0000 mg | ORAL_TABLET | Freq: Every day | ORAL | Status: DC
  Administered 2012-06-11 – 2012-06-16 (×6): 500 mg via ORAL
  Filled 2012-06-11 (×3): qty 1
  Filled 2012-06-11: qty 7
  Filled 2012-06-11 (×5): qty 1

## 2012-06-11 MED ORDER — NICOTINE 21 MG/24HR TD PT24
21.0000 mg | MEDICATED_PATCH | Freq: Every day | TRANSDERMAL | Status: DC
  Administered 2012-06-11 – 2012-06-16 (×6): 21 mg via TRANSDERMAL
  Filled 2012-06-11 (×9): qty 1

## 2012-06-11 MED ORDER — MAGNESIUM HYDROXIDE 400 MG/5ML PO SUSP: 30.0000 mL | Freq: Every day | ORAL | Status: DC | PRN

## 2012-06-11 MED ORDER — QUETIAPINE FUMARATE 300 MG PO TABS
300.0000 mg | ORAL_TABLET | Freq: Every day | ORAL | Status: DC
  Administered 2012-06-11 – 2012-06-15 (×5): 300 mg via ORAL
  Filled 2012-06-11 (×7): qty 1

## 2012-06-11 MED ORDER — SERTRALINE HCL 100 MG PO TABS
200.0000 mg | ORAL_TABLET | Freq: Every day | ORAL | Status: DC
  Administered 2012-06-11 – 2012-06-16 (×6): 200 mg via ORAL
  Filled 2012-06-11: qty 2
  Filled 2012-06-11: qty 14
  Filled 2012-06-11 (×6): qty 2

## 2012-06-11 MED ORDER — QUETIAPINE FUMARATE 100 MG PO TABS
100.0000 mg | ORAL_TABLET | Freq: Every day | ORAL | Status: DC
  Administered 2012-06-12 – 2012-06-16 (×5): 100 mg via ORAL
  Filled 2012-06-11 (×3): qty 1
  Filled 2012-06-11: qty 32
  Filled 2012-06-11 (×3): qty 1

## 2012-06-11 MED ORDER — QUETIAPINE FUMARATE 50 MG PO TABS
50.0000 mg | ORAL_TABLET | Freq: Every day | ORAL | Status: DC
  Administered 2012-06-11 – 2012-06-16 (×6): 50 mg via ORAL
  Filled 2012-06-11 (×9): qty 1

## 2012-06-11 MED ORDER — IBUPROFEN 600 MG PO TABS
600.0000 mg | ORAL_TABLET | Freq: Three times a day (TID) | ORAL | Status: DC
  Administered 2012-06-11 – 2012-06-16 (×14): 600 mg via ORAL
  Filled 2012-06-11 (×22): qty 1

## 2012-06-11 MED ORDER — TRAZODONE HCL 50 MG PO TABS
50.0000 mg | ORAL_TABLET | Freq: Every evening | ORAL | Status: DC | PRN
  Administered 2012-06-11: 50 mg via ORAL
  Filled 2012-06-11: qty 1

## 2012-06-11 MED ORDER — IBUPROFEN 600 MG PO TABS
600.0000 mg | ORAL_TABLET | Freq: Three times a day (TID) | ORAL | Status: DC | PRN
  Administered 2012-06-11: 600 mg via ORAL
  Filled 2012-06-11: qty 1

## 2012-06-11 NOTE — Progress Notes (Signed)
Psychoeducational Group Note  Date:  06/11/2012 Time: 1100  Group Topic/Focus:  Personal Choices and Values:   The focus of this group is to help patients assess and explore the importance of values in their lives, how their values affect their decisions, how they express their values and what opposes their expression.  Participation Level:  Active  Participation Quality:  Appropriate and Attentive  Affect:  Appropriate  Cognitive:  Appropriate  Insight:  Good  Engagement in Group:  Good  Additional Comments: Pt participated in group. Pt explained what values were important, and how values have changed over a person's lifespan. Pt also completed worksheet on identifying values and choosing a value-oriented life worksheet.  Karleen Hampshire Brittini 06/11/2012, 3:16 PM

## 2012-06-11 NOTE — Progress Notes (Signed)
[facility] Group Notes: (Counselor/Nursing/MHT/Case Management/Adjunct) 06/11/2012   1:15-2:30pm Emotion Regulation  Type of Therapy:  Group Therapy  Participation Level:  Active  Participation Quality: APpropriate    Affect:  Appropriate  Cognitive:  Appropriate  Insight:  Good  Engagement in Group: Good  Engagement in Therapy:  Good  Modes of Intervention:  Support and Exploration  Summary of Progress/Problems: [name] was very engaged, discussing depression and anger as emotions that he has been overcome by in the past. He explored her physical and psychological symptoms of anger, and participated in breathing and progressive muscle relaxation exercises. He identified anger as his main problems and stated that he gets angry when people disrespect him or when they are mean to women.  He processed the experience and said that it was very helpful in reducing his anxiety  Elizeo Palms, LCSW 06/11/2012  2:58 PM

## 2012-06-11 NOTE — Progress Notes (Signed)
[facility] Group Notes:  (Counselor/Nursing/MHT/Case Management/Adjunct)  06/11/2012 3:49 PM  Type of Therapy:  Psychoeducational Skills  Participation Level:  Minimal  Participation Quality:  Attentive, Redirectable and Resistant  Affect:  Appropriate  Cognitive:  Alert, Appropriate and Oriented  Insight:  Limited  Engagement in Group:  Limited  Engagement in Therapy:  n/a  Modes of Intervention:  Activity, Education, Problem-solving, Socialization and Support  Summary of Progress/Problems:  [name] attended psycho education group on labels. 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 [name] was quiet but spoke up at the end while the group discussed what labels are, how we use them, and listed positive and negative labels they have used or been called. [name] was given a homework assignment to list 10 words he has been labeled to find the reality of the situation/label.    [name] 06/11/2012, 3:49 PM

## 2012-06-11 NOTE — Progress Notes (Signed)
D; pt was medicated with 600 of ibuprofen & 5 mg. Oxy IR.Pain was reassessed in one hour& pt. Stated pain was still 8 . A: pt. Was given a heat pack & encouraged to take a hot shower & rest in bed.Continues on 15 minute checks. R: Pt safety maintained.
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
## 2012-06-11 NOTE — Tx Team (Signed)
Initial Interdisciplinary Treatment Plan  PATIENT STRENGTHS: (choose at least two) Ability for insight Motivation for treatment/growth Supportive family/friends  PATIENT STRESSORS: Financial difficulties Legal issue Marital or family conflict Medication change or noncompliance Substance abuse   PROBLEM LIST: Problem List/Patient Goals Date to be addressed Date deferred Reason deferred Estimated date of resolution  Suicidal Ideations 06/10/2012     Depression 06/10/2012     Substance abuse 06/10/2012                                          DISCHARGE CRITERIA:  Improved stabilization in mood, thinking, and/or behavior Motivation to continue treatment in a less acute level of care Safe-care adequate arrangements made Verbal commitment to aftercare and medication compliance  PRELIMINARY DISCHARGE PLAN: Attend aftercare/continuing care group Outpatient therapy Placement in alternative living arrangements  PATIENT/FAMIILY INVOLVEMENT: This treatment plan has been presented to and reviewed with the patient, [name].  The patient and family have been given the opportunity to ask questions and make suggestions.  Angeline Slim M 06/11/2012, 12:36 AM

## 2012-06-11 NOTE — Progress Notes (Signed)
[facility] Group Notes:  (Counselor/Nursing/MHT/Case Management/Adjunct)  06/11/2012 10:31 PM  Type of Therapy:  Wrap up group  Participation Level:  Active  Participation Quality:  Appropriate  Affect:  Appropriate  Cognitive:  Appropriate  Insight:  Good  Engagement in Group:  Good  Engagement in Therapy:  Good  Modes of Intervention:  Wrap up group  Summary of Progress/Problems: Patient was actively participating in group. Very happy and full of laughter.  Juanda Chance Jvette 06/11/2012, 10:31 PM

## 2012-06-11 NOTE — Progress Notes (Signed)
D: Pt about on the unit today.  He presents with a flat affect/depressed mood. Passive SI.  Does contract for safety.  He has been attending all programming.  He rates his depression as a 7/10 and hopelessness as a 6/10.  He does state he feels better since his medication got started.  He also reports relief from shoulder pain since receiving Oxycodone medication.  Reports sleep was not good and appetite has been good.  A;  Pt given prescribed meds, and given support and encouragement by RN.  R:  Pt receptive to staff.   Remains on q 15 minute checks for safety.  Will continue to monitor.
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
## 2012-06-11 NOTE — Progress Notes (Signed)
Patient seen during during d/c planning group and or treatment team.  He advised of having a suicide attempt after being discharged from jail and finding out fiance had found another man.  He reports SI today but able to contract for safety.  He is rating all symptoms at eight.  He advised that he is homeless and does not have any support system. He will need outpatient follow up and medication assistance.

## 2012-06-11 NOTE — [facility] Suicide Risk Assessment (Signed)
Suicide Risk Assessment  Admission Assessment     Demographic factors:  Assessment Details Time of Assessment: Admission Information Obtained From: Patient Current Mental Status:  Current Mental Status: Suicidal ideation indicated by patient Loss Factors:  Loss Factors: Loss of significant relationship;Legal issues;Financial problems / change in socioeconomic status Historical Factors:  Historical Factors: Prior suicide attempts Risk Reduction Factors:  Risk Reduction Factors: Responsible for children under 55 years of age  CLINICAL FACTORS:   Severe Anxiety and/or Agitation Bipolar Disorder:   Bipolar II Alcohol/Substance Abuse/Dependencies Previous Psychiatric Diagnoses and Treatments  COGNITIVE FEATURES THAT CONTRIBUTE TO RISK:  Thought constriction (tunnel vision)    SUICIDE RISK:   Moderate:  Frequent suicidal ideation with limited intensity, and duration, some specificity in terms of plans, no associated intent, good self-control, limited dysphoria/symptomatology, some risk factors present, and identifiable protective factors, including available and accessible social support.  Reason for hospitalization: .Upset and didn't know what to do after his girlfriend left him. Diagnosis:   Axis I: Bipolar, Depressed and Cannabis Abuse Axis II: Deferred Axis III:  Past Medical History  Diagnosis Date  . Asthma   . Bipolar depression   . Schizophrenia    Axis IV: other psychosocial or environmental problems Axis V: 21-30 behavior considerably influenced by delusions or hallucinations OR serious impairment in judgment, communication OR inability to function in almost all areas  ADL's:  Intact  Sleep: Poor  Appetite:  Fair  Suicidal Ideation:  Pt denies any thoughts, plans, intent of suicide Homicidal Ideation:  Pt denies any thoughts, plans, intent of homicide  AEB (as evidenced by): per pt report  Mental Status Examination/Evaluation: Objective:  Appearance: Casual    Eye Contact::  Good  Speech:  Clear and Coherent  Volume:  Normal  Mood:  Anxious, Depressed and Irritable  Affect:  Blunt  Thought Process:  Coherent  Orientation:  Full  Thought Content:  WDL  Suicidal Thoughts:  No  Homicidal Thoughts:  No  Memory:  Immediate;   Fair Recent;   Fair Remote;   Fair  Judgement:  Impaired  Insight:  Fair  Psychomotor Activity:  Normal  Concentration:  Fair  Recall:  Fair  Akathisia:  No  Handed:  Right  AIMS (if indicated):     Assets:  Communication Skills Desire for Improvement  Sleep:  Number of Hours: 4    Vital Signs:Blood pressure 120/70, pulse 108, temperature 97.5 F (36.4 C), temperature source Oral, resp. rate 16, height 5\' 4"  (1.626 m), weight 80.74 kg (178 lb). Current Medications: Current Facility-Administered Medications  Medication Dose Route Frequency Provider Last Rate Last Dose  . acetaminophen (TYLENOL) tablet 650 mg  650 mg Oral Q6H PRN Kerry Hough, PA      . alum & mag hydroxide-simeth (MAALOX/MYLANTA) 200-200-20 MG/5ML suspension 30 mL  30 mL Oral Q4H PRN Kerry Hough, PA      . divalproex (DEPAKOTE ER) 24 hr tablet 500 mg  500 mg Oral Daily Mike Craze, MD   500 mg at 06/11/12 1455  . ibuprofen (ADVIL,MOTRIN) tablet 600 mg  600 mg Oral Q8H Mike Craze, MD   600 mg at 06/11/12 1455  . magnesium hydroxide (MILK OF MAGNESIA) suspension 30 mL  30 mL Oral Daily PRN Kerry Hough, PA      . nicotine (NICODERM CQ - dosed in mg/24 hours) patch 21 mg  21 mg Transdermal Q0600 Kerry Hough, PA   21 mg at 06/11/12 1914  .  oxyCODONE (Oxy IR/ROXICODONE) immediate release tablet 10 mg  10 mg Oral NOW Mike Craze, MD   10 mg at 06/11/12 1455  . oxyCODONE (Oxy IR/ROXICODONE) immediate release tablet 5 mg  5 mg Oral Q6H PRN Mike Craze, MD      . QUEtiapine (SEROQUEL) tablet 100 mg  100 mg Oral QAC breakfast Mike Craze, MD      . QUEtiapine (SEROQUEL) tablet 300 mg  300 mg Oral QHS Mike Craze, MD      .  QUEtiapine (SEROQUEL) tablet 50 mg  50 mg Oral Q1400 Mike Craze, MD   50 mg at 06/11/12 1455  . sertraline (ZOLOFT) tablet 200 mg  200 mg Oral Daily Mike Craze, MD   200 mg at 06/11/12 1621  . traZODone (DESYREL) tablet 100 mg  100 mg Oral QHS,MR X 1 Mike Craze, MD      . traZODone (DESYREL) tablet 50 mg  50 mg Oral QHS PRN Kerry Hough, PA   50 mg at 06/11/12 0124  . DISCONTD: ibuprofen (ADVIL,MOTRIN) tablet 600 mg  600 mg Oral Q8H PRN Kerry Hough, PA   600 mg at 06/11/12 0124  . DISCONTD: ibuprofen (ADVIL,MOTRIN) tablet 600 mg  600 mg Oral TID Mike Craze, MD       Facility-Administered Medications Ordered in Other Encounters  Medication Dose Route Frequency Provider Last Rate Last Dose  . DISCONTD: acetaminophen (TYLENOL) tablet 650 mg  650 mg Oral Q4H PRN Suzi Roots, MD      . DISCONTD: acetaminophen (TYLENOL) tablet 650 mg  650 mg Oral Q6H PRN Kerry Hough, PA      . DISCONTD: alum & mag hydroxide-simeth (MAALOX/MYLANTA) 200-200-20 MG/5ML suspension 30 mL  30 mL Oral Q4H PRN Kerry Hough, PA      . DISCONTD: chlordiazePOXIDE (LIBRIUM) capsule 25 mg  25 mg Oral Q6H PRN Kerry Hough, PA      . DISCONTD: HYDROcodone-acetaminophen (NORCO/VICODIN) 5-325 MG per tablet 1 tablet  1 tablet Oral Once Suzi Roots, MD      . DISCONTD: ibuprofen (ADVIL,MOTRIN) tablet 600 mg  600 mg Oral Q8H PRN Suzi Roots, MD      . DISCONTD: ibuprofen (ADVIL,MOTRIN) tablet 600 mg  600 mg Oral QID PRN Kerry Hough, PA      . DISCONTD: magnesium hydroxide (MILK OF MAGNESIA) suspension 30 mL  30 mL Oral Daily PRN Kerry Hough, PA      . DISCONTD: nicotine (NICODERM CQ - dosed in mg/24 hours) patch 21 mg  21 mg Transdermal Q0600 Kerry Hough, PA      . DISCONTD: traZODone (DESYREL) tablet 50 mg  50 mg Oral QHS PRN,MR X 1 Kerry Hough, PA        Lab Results:  Results for orders placed during the hospital encounter of 06/10/12 (from the past 48 hour(s))  TSH     Status:  Normal   Collection Time   06/11/12  6:34 AM      Component Value Range Comment   TSH 1.112  0.350 - 4.500 uIU/mL   T4, FREE     Status: Normal   Collection Time   06/11/12  6:34 AM      Component Value Range Comment   Free T4 1.33  0.80 - 1.80 ng/dL     Physical Findings: AIMS:  ,Extremity Movements Upper (arms, wrists, hands, fingers): None, normal Lower (legs, knees,  ankles, toes): None, normal, Trunk Movements Neck, shoulders, hips: None, normal, Overall Severity Severity of abnormal movements (highest score from questions above): None, normal Incapacitation due to abnormal movements: None, normal Patient's awareness of abnormal movements (rate only patient's report): No Awareness, Dental Status Current problems with teeth and/or dentures?: No Does patient usually wear dentures?: No  CIWA:  CIWA-Ar Total: 0  COWS:     Risk: Risk of harm to self is elevated by his bipolar disorder, aggressive past, and addictions.  Risk of harm to others is elevated by his past history of aggression.  Treatment Plan Summary: Daily contact with patient to assess and evaluate symptoms and progress in treatment Medication management No suicidal or homicidal thoughts for at least 48 hours. Mood/anxiety less than 3/10 where the scale is 1 is the best and 10 is the worst  Plan: Admit, restart most of his home meds for bipolar disorder, start opiates for broken collar bone, not restart Klonopin for what he thinks is bipolar disorder.   Discussed the risks, benefits, and probable clinical course with and without treatment.  Pt is agreeable to the current course of treatment. We will continue on q. 15 checks the unit protocol. At this time there is no clinical indication for one-to-one observation as patient contract for safety and presents little risk to harm themself and others.  We will increase collateral information. I encourage patient to participate in group milieu therapy. Pt will be seen in  treatment team soon for further treatment and appropriate discharge planning. Please see history and physical note for more detailed information ELOS: 3 to 5 days.   [name] 06/11/2012, 6:03 PM
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
## 2012-06-11 NOTE — Tx Team (Signed)
Interdisciplinary Treatment Plan Update (Adult)  Date:  06/11/2012  Time Reviewed:  10:45 AM   Progress in Treatment: Attending groups:   Yes   Participating in groups:  Yes Taking medication as prescribed:  Yes Tolerating medication:  Yes Family/Significant othe contact made: No - patient denies having any family involvement Patient understands diagnosis:  Yes Discussing patient identified problems/goals with staff: Yes Medical problems stabilized or resolved: Yes Denies suicidal/homicidal ideation:  No.  Patient endorses SI but contracts for safety Issues/concerns per patient self-inventory:  Other:  New problem(s) identified:  Reason for Continuation of Hospitalization: Anxiety Depression Medication stabilization Suicidal ideation  Interventions implemented related to continuation of hospitalization:  Medication Management; safety checks q 15 mins  Additional comments:  Estimated length of stay: 3-4 days  Discharge Plan:  Outpatient follow up  New goal(s):  Review of initial/current patient goals per problem list:    1.  Goal(s): Eliminate SI/other thoughts of self harm   Met:  No  Target date: d/c  As evidenced by: Patient will no longer endorse SI/other thought self harm    2.  Goal (s): Reduce depression/anxiety (rated at eight today)   Met:  No  Target date: d/c  As evidenced by: Patient will rate symptoms at four or below    3.  Goal(s): .stabilize on meds   Met:  No  Target date: d/c  As evidenced by: Patient will report being stable on medications - symptoms have decreased    4.  Goal(s):  Refer for outpatient follow up   Met:  No  Target date: d/c  As evidenced by:  Outpatient follow up will be scheduled  Attendees: Patient:   06/11/2012 10:46 AM  Family:     Physician:  Orson Aloe, MD 06/11/2012 10:45 AM   Nursing:   Carney Living, RN 06/11/2012 10:45 AM   CaseManager:  Juline Patch, LCSW 06/11/2012 10:45 AM     Counselor:  Stella Palms, LCSW 06/11/2012 10:45 AM   Other:  Reyes Ivan, LCSWA     06/11/2012 10:46 AM

## 2012-06-11 NOTE — Progress Notes (Signed)
[facility] MD Progress Note  06/11/2012 5:56 PM  Diagnosis:   Axis I: Bipolar, Depressed and Cannabis Abuse Axis II: Deferred Axis III:  Past Medical History  Diagnosis Date  . Asthma   . Bipolar depression   . Schizophrenia    Axis IV: other psychosocial or environmental problems Axis V: 21-30 behavior considerably influenced by delusions or hallucinations OR serious impairment in judgment, communication OR inability to function in almost all areas  ADL's:  Intact  Sleep: Poor  Appetite:  Fair  Suicidal Ideation:  Pt denies any thoughts, plans, intent of suicide Homicidal Ideation:  Pt denies any thoughts, plans, intent of homicide  AEB (as evidenced by): per pt report  Mental Status Examination/Evaluation: Objective:  Appearance: Casual  Eye Contact::  Good  Speech:  Clear and Coherent  Volume:  Normal  Mood:  Anxious, Depressed and Irritable  Affect:  Blunt  Thought Process:  Coherent  Orientation:  Full  Thought Content:  WDL  Suicidal Thoughts:  No  Homicidal Thoughts:  No  Memory:  Immediate;   Fair Recent;   Fair Remote;   Fair  Judgement:  Impaired  Insight:  Fair  Psychomotor Activity:  Normal  Concentration:  Fair  Recall:  Fair  Akathisia:  No  Handed:  Right  AIMS (if indicated):     Assets:  Communication Skills Desire for Improvement  Sleep:  Number of Hours: 4    Vital Signs:Blood pressure 120/70, pulse 108, temperature 97.5 F (36.4 C), temperature source Oral, resp. rate 16, height 5\' 4"  (1.626 m), weight 80.74 kg (178 lb). Current Medications: Current Facility-Administered Medications  Medication Dose Route Frequency Provider Last Rate Last Dose  . acetaminophen (TYLENOL) tablet 650 mg  650 mg Oral Q6H PRN Kerry Hough, PA      . alum & mag hydroxide-simeth (MAALOX/MYLANTA) 200-200-20 MG/5ML suspension 30 mL  30 mL Oral Q4H PRN Kerry Hough, PA      . divalproex (DEPAKOTE ER) 24 hr tablet 500 mg  500 mg Oral Daily Mike Craze, MD    500 mg at 06/11/12 1455  . ibuprofen (ADVIL,MOTRIN) tablet 600 mg  600 mg Oral Q8H Mike Craze, MD   600 mg at 06/11/12 1455  . magnesium hydroxide (MILK OF MAGNESIA) suspension 30 mL  30 mL Oral Daily PRN Kerry Hough, PA      . nicotine (NICODERM CQ - dosed in mg/24 hours) patch 21 mg  21 mg Transdermal Q0600 Kerry Hough, PA   21 mg at 06/11/12 1610  . oxyCODONE (Oxy IR/ROXICODONE) immediate release tablet 10 mg  10 mg Oral NOW Mike Craze, MD   10 mg at 06/11/12 1455  . oxyCODONE (Oxy IR/ROXICODONE) immediate release tablet 5 mg  5 mg Oral Q6H PRN Mike Craze, MD      . QUEtiapine (SEROQUEL) tablet 100 mg  100 mg Oral QAC breakfast Mike Craze, MD      . QUEtiapine (SEROQUEL) tablet 300 mg  300 mg Oral QHS Mike Craze, MD      . QUEtiapine (SEROQUEL) tablet 50 mg  50 mg Oral Q1400 Mike Craze, MD   50 mg at 06/11/12 1455  . sertraline (ZOLOFT) tablet 200 mg  200 mg Oral Daily Mike Craze, MD   200 mg at 06/11/12 1621  . traZODone (DESYREL) tablet 100 mg  100 mg Oral QHS,MR X 1 Mike Craze, MD      . traZODone (  DESYREL) tablet 50 mg  50 mg Oral QHS PRN Kerry Hough, PA   50 mg at 06/11/12 0124  . DISCONTD: ibuprofen (ADVIL,MOTRIN) tablet 600 mg  600 mg Oral Q8H PRN Kerry Hough, PA   600 mg at 06/11/12 0124  . DISCONTD: ibuprofen (ADVIL,MOTRIN) tablet 600 mg  600 mg Oral TID Mike Craze, MD       Facility-Administered Medications Ordered in Other Encounters  Medication Dose Route Frequency Provider Last Rate Last Dose  . DISCONTD: acetaminophen (TYLENOL) tablet 650 mg  650 mg Oral Q4H PRN Suzi Roots, MD      . DISCONTD: acetaminophen (TYLENOL) tablet 650 mg  650 mg Oral Q6H PRN Kerry Hough, PA      . DISCONTD: alum & mag hydroxide-simeth (MAALOX/MYLANTA) 200-200-20 MG/5ML suspension 30 mL  30 mL Oral Q4H PRN Kerry Hough, PA      . DISCONTD: chlordiazePOXIDE (LIBRIUM) capsule 25 mg  25 mg Oral Q6H PRN Kerry Hough, PA      . DISCONTD:  HYDROcodone-acetaminophen (NORCO/VICODIN) 5-325 MG per tablet 1 tablet  1 tablet Oral Once Suzi Roots, MD      . DISCONTD: ibuprofen (ADVIL,MOTRIN) tablet 600 mg  600 mg Oral Q8H PRN Suzi Roots, MD      . DISCONTD: ibuprofen (ADVIL,MOTRIN) tablet 600 mg  600 mg Oral QID PRN Kerry Hough, PA      . DISCONTD: magnesium hydroxide (MILK OF MAGNESIA) suspension 30 mL  30 mL Oral Daily PRN Kerry Hough, PA      . DISCONTD: nicotine (NICODERM CQ - dosed in mg/24 hours) patch 21 mg  21 mg Transdermal Q0600 Kerry Hough, PA      . DISCONTD: traZODone (DESYREL) tablet 50 mg  50 mg Oral QHS PRN,MR X 1 Kerry Hough, PA        Lab Results:  Results for orders placed during the hospital encounter of 06/10/12 (from the past 48 hour(s))  TSH     Status: Normal   Collection Time   06/11/12  6:34 AM      Component Value Range Comment   TSH 1.112  0.350 - 4.500 uIU/mL   T4, FREE     Status: Normal   Collection Time   06/11/12  6:34 AM      Component Value Range Comment   Free T4 1.33  0.80 - 1.80 ng/dL     Physical Findings: AIMS:  ,Extremity Movements Upper (arms, wrists, hands, fingers): None, normal Lower (legs, knees, ankles, toes): None, normal, Trunk Movements Neck, shoulders, hips: None, normal, Overall Severity Severity of abnormal movements (highest score from questions above): None, normal Incapacitation due to abnormal movements: None, normal Patient's awareness of abnormal movements (rate only patient's report): No Awareness, Dental Status Current problems with teeth and/or dentures?: No Does patient usually wear dentures?: No  CIWA:  CIWA-Ar Total: 0  COWS:     Treatment Plan Summary: Daily contact with patient to assess and evaluate symptoms and progress in treatment Medication management No suicidal or homicidal thoughts for at least 48 hours. Mood/anxiety less than 3/10 where the scale is 1 is the best and 10 is the worst  Plan: Admit, restart most of his home  meds for bipolar disorder, start opiates for broken collar bone, not restart Klonopin for what he thinks is bipolar disorder.   Discussed the risks, benefits, and probable clinical course with and without treatment.  Pt is agreeable  to the current course of treatment.  [name] 06/11/2012, 5:56 PM

## 2012-06-11 NOTE — H&P (Signed)
Medical/psychiatric screening examination/treatment/procedure(s) were performed by non-physician practitioner and as supervising physician I was immediately available for consultation/collaboration.  I have seen and examined this patient and agree the major elements of this evaluation.  

## 2012-06-12 DIAGNOSIS — F209 Schizophrenia, unspecified: Secondary | ICD-10-CM

## 2012-06-12 DIAGNOSIS — R45851 Suicidal ideations: Secondary | ICD-10-CM

## 2012-06-12 DIAGNOSIS — R45850 Homicidal ideations: Secondary | ICD-10-CM

## 2012-06-12 NOTE — Progress Notes (Signed)
Psychoeducational Group Note  Date:  06/12/2012 Time:  1100  Group Topic/Focus:  Overcoming Stress:   The focus of this group is to define stress and help patients assess their triggers.  Participation Level: Did Not Attend  Participation Quality:  Not Applicable  Affect:  Not Applicable  Cognitive:  Not Applicable  Insight:  Not Applicable  Engagement in Group: Not Applicable  Additional Comments: Pt remained in bed for this group.   Sharyn Lull 06/12/2012, 12:09 PM

## 2012-06-12 NOTE — Progress Notes (Deleted)
D: Pt. Denies SI,HI, & AVH.Pt d/c,d home with daughter. A: Pt given belongings,samples, & d/c instructions. R: pt verbalized understanding of all d/c instructions.

## 2012-06-12 NOTE — Progress Notes (Signed)
Pt extremely sleepy getting back on the meds that he reports he had been on in the distant past.

## 2012-06-12 NOTE — Progress Notes (Signed)
Psychoeducational Group Note  Date:  06/12/2012 Time:  1000  Group Topic/Focus:  Therapeutic Activity-Question Ball  Participation Level: Did Not Attend  Participation Quality:  Not Applicable  Affect:  Not Applicable  Cognitive:  Not Applicable  Insight:  Not Applicable  Engagement in Group: Not Applicable  Additional Comments:    Noah Charon 06/12/2012, 11:07 AM

## 2012-06-12 NOTE — Progress Notes (Signed)
D:  Pt. about on the unit today.  Pt. Stated he slept well.  Pt. rates depression as a 0/10.  Rates hopelessness as a 0/10.  Denies SI/HI.  He said that he needs needs to get in touch with his probation officer.  RN gave CM a message about this.  He says that his shoulder has been bothering him and that the Oxycodone helps.  He is attending groups.  A; Offered support and encouragement.  Rn gave meds as prescribed and prn for shoulder pain.  R:  Pt receptive to staff.  Remains on q 15 minute checks for safety.  Will continue to monitor.

## 2012-06-12 NOTE — Progress Notes (Signed)
D: Pt participated appropriately in all unit activities; reports that he has had a great day; Denies SI,HI, & AVH.Pain is being fairly well controlled with the medications. A:Supported. Continues on 15 minute checks. R: Pt. Safety maintained.

## 2012-06-12 NOTE — Progress Notes (Signed)
[facility] MD Progress Note  06/12/2012 2:25 PM  S: "I'm sleepy this morning, but doing fine. My mood is good. I am lying in bed because I don't like breakfast that much. I can do without breakfast. I plan to go to the cafeteria for lunch. No bad thoughts".  Diagnosis:   Axis I: Schizoaffective disorder, Cannabis abuse, Suicidal/homicidal ideations. Axis II: Deferred Axis III:  Past Medical History  Diagnosis Date  . Asthma   . Bipolar depression   . Schizophrenia    Axis IV: other psychosocial or environmental problems Axis V: 61-70 mild symptoms  ADL's:  Intact  Sleep: Good  Appetite:  Good  Suicidal Ideation: "No" Plan:  No Intent:  No Means:  No Homicidal Ideation:  Plan:  No Intent:  no Means:  no  AEB (as evidenced by): Per patient's reports  Mental Status Examination/Evaluation: Objective:  Appearance: Casual  Eye Contact::  Good  Speech:  Clear and Coherent  Volume:  Normal  Mood:  Euthymic  Affect:  Appropriate  Thought Process:  Coherent and Intact  Orientation:  Full  Thought Content:  Rumination  Suicidal Thoughts:  No  Homicidal Thoughts:  No  Memory:  Immediate;   Good Recent;   Good Remote;   Good  Judgement:  Fair  Insight:  Fair  Psychomotor Activity:  Normal  Concentration:  Good  Recall:  Good  Akathisia:  No  Handed:  Right  AIMS (if indicated):     Assets:  Desire for Improvement  Sleep:  Number of Hours: 5.75    Vital Signs:Blood pressure 133/85, pulse 80, temperature 97.3 F (36.3 C), temperature source Oral, resp. rate 16, height 5\' 4"  (1.626 m), weight 80.74 kg (178 lb). Current Medications: Current Facility-Administered Medications  Medication Dose Route Frequency Provider Last Rate Last Dose  . acetaminophen (TYLENOL) tablet 650 mg  650 mg Oral Q6H PRN Kerry Hough, PA      . alum & mag hydroxide-simeth (MAALOX/MYLANTA) 200-200-20 MG/5ML suspension 30 mL  30 mL Oral Q4H PRN Kerry Hough, PA      . divalproex (DEPAKOTE ER)  24 hr tablet 500 mg  500 mg Oral Daily Mike Craze, MD   500 mg at 06/12/12 0756  . ibuprofen (ADVIL,MOTRIN) tablet 600 mg  600 mg Oral Q8H Mike Craze, MD   600 mg at 06/12/12 1401  . magnesium hydroxide (MILK OF MAGNESIA) suspension 30 mL  30 mL Oral Daily PRN Kerry Hough, PA      . nicotine (NICODERM CQ - dosed in mg/24 hours) patch 21 mg  21 mg Transdermal Q0600 Kerry Hough, PA   21 mg at 06/12/12 0755  . oxyCODONE (Oxy IR/ROXICODONE) immediate release tablet 10 mg  10 mg Oral NOW Mike Craze, MD   10 mg at 06/11/12 1455  . oxyCODONE (Oxy IR/ROXICODONE) immediate release tablet 5 mg  5 mg Oral Q6H PRN Mike Craze, MD   5 mg at 06/12/12 0834  . QUEtiapine (SEROQUEL) tablet 100 mg  100 mg Oral QAC breakfast Mike Craze, MD   100 mg at 06/12/12 0756  . QUEtiapine (SEROQUEL) tablet 300 mg  300 mg Oral QHS Mike Craze, MD   300 mg at 06/11/12 2139  . QUEtiapine (SEROQUEL) tablet 50 mg  50 mg Oral Q1400 Mike Craze, MD   50 mg at 06/12/12 1401  . sertraline (ZOLOFT) tablet 200 mg  200 mg Oral Daily Mike Craze, MD  200 mg at 06/12/12 0756  . traZODone (DESYREL) tablet 100 mg  100 mg Oral QHS,MR X 1 Mike Craze, MD   100 mg at 06/11/12 2307  . traZODone (DESYREL) tablet 50 mg  50 mg Oral QHS PRN Kerry Hough, PA   50 mg at 06/11/12 0124    Lab Results:  Results for orders placed during the hospital encounter of 06/10/12 (from the past 48 hour(s))  TSH     Status: Normal   Collection Time   06/11/12  6:34 AM      Component Value Range Comment   TSH 1.112  0.350 - 4.500 uIU/mL   T4, FREE     Status: Normal   Collection Time   06/11/12  6:34 AM      Component Value Range Comment   Free T4 1.33  0.80 - 1.80 ng/dL     Physical Findings: AIMS: Facial and Oral Movements Muscles of Facial Expression: None, normal Lips and Perioral Area: None, normal Jaw: None, normal Tongue: None, normal,Extremity Movements Upper (arms, wrists, hands, fingers): None,  normal Lower (legs, knees, ankles, toes): None, normal, Trunk Movements Neck, shoulders, hips: None, normal, Overall Severity Severity of abnormal movements (highest score from questions above): None, normal Incapacitation due to abnormal movements: None, normal Patient's awareness of abnormal movements (rate only patient's report): No Awareness, Dental Status Current problems with teeth and/or dentures?: No Does patient usually wear dentures?: No  CIWA:  CIWA-Ar Total: 0  COWS:     Treatment Plan Summary: Daily contact with patient to assess and evaluate symptoms and progress in treatment Medication management  Plan: No changes made on the current treatment plan. Continue current treatment plan.  Armandina Stammer I 06/12/2012, 2:25 PM

## 2012-06-12 NOTE — Progress Notes (Signed)
D: Pt in bed resting with eyes closed. Respirations even and unlabored. Pt appears to be in no signs of distress at this time. A: Q15min checks remains for this pt. R: Pt remains safe at this time.   

## 2012-06-12 NOTE — Progress Notes (Signed)
Patient did attend the evening Karaoke group.  Patient was engaged, supportive, and active. Pt participated through dance.

## 2012-06-13 DIAGNOSIS — F1210 Cannabis abuse, uncomplicated: Secondary | ICD-10-CM

## 2012-06-13 DIAGNOSIS — F3130 Bipolar disorder, current episode depressed, mild or moderate severity, unspecified: Principal | ICD-10-CM

## 2012-06-13 MED ORDER — HYDROCODONE-ACETAMINOPHEN 5-325 MG PO TABS
1.0000 | ORAL_TABLET | Freq: Four times a day (QID) | ORAL | Status: DC | PRN
  Administered 2012-06-13: 1 via ORAL
  Administered 2012-06-14 – 2012-06-16 (×7): 2 via ORAL
  Filled 2012-06-13 (×3): qty 2
  Filled 2012-06-13: qty 1
  Filled 2012-06-13 (×4): qty 2

## 2012-06-13 NOTE — Progress Notes (Signed)
D: Pt in bed resting with eyes closed. Respirations even and unlabored. Pt appears to be in no signs of distress at this time. A: Q15min checks remains for this pt. R: Pt remains safe at this time.   

## 2012-06-13 NOTE — Progress Notes (Signed)
Psychoeducational Group Note  Date:  06/13/2012 Time:  1100  Group Topic/Focus:  Recovery Goals:   The focus of this group is to identify appropriate goals for recovery and establish a plan to achieve them.  Participation Level:  Active  Participation Quality:  Appropriate and Attentive  Affect:  Appropriate  Cognitive:  Appropriate  Insight:  Good  Engagement in Group:  Good  Additional Comments:  Pt participated in group. Pt explained defined in own words of relapse and was given term of relapse.Pt also was given information on recognizing when in crisis of relapsing. Pt stated ways, times, behaviors, and attitudes when in stages of relapsing. Pt also was given a handout on defining relapse, and coping skills to prevent relapsing. Pt was appropriate and shared during group.   [name] 06/13/2012, 3:16 PM 

## 2012-06-13 NOTE — Progress Notes (Signed)
[name] is seen out in the milieu..he laughs, jokes and minimizes his problems and is seen interacting with other pts. He takes his meds as ordered and  Attends his groups as planned.             A He completed his self inventory this morning and on it he wrote he denied SI, he rated his depression and hopelessness " 0/0" and stated his DC plan is to " cont to take my meds".             R Safety is in place and POC includes continuing to foster therapeutic relationship PD RN [facility]

## 2012-06-13 NOTE — Progress Notes (Signed)
[facility] Group Notes:  (Counselor/Nursing/MHT/Case Management/Adjunct)  06/13/2012 10:27 PM  Type of Therapy:  Psychoeducational Skills  Participation Level:  Minimal  Participation Quality:  Inattentive  Affect:  Excited  Cognitive:  Appropriate  Insight:  Limited  Engagement in Group:  Limited  Engagement in Therapy:  Limited  Modes of Intervention:  Education  Summary of Progress/Problems: The pt. verbalized in group that he had a "so so" day.  He verbalized that he coped with his day by socializing with his peers. His goal for tomorrow is to get himself ready for discharge and to have a "good day" by not having any "melt downs".    Hazle Coca S 06/13/2012, 10:27 PM
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
## 2012-06-13 NOTE — Progress Notes (Signed)
[facility] MD Progress Note  06/13/2012 5:03 PM  Diagnosis:   Axis I: Bipolar, Depressed and Cannabis Abuse Axis II: Deferred Axis III:  Past Medical History  Diagnosis Date  . Asthma   . Bipolar depression   . Schizophrenia    Axis IV: other psychosocial or environmental problems Axis V: 51-60 moderate symptoms  ADL's:  Intact  Sleep: Good  Appetite:  Good  Suicidal Ideation:  Pt denies any thoughts, plans, intent of suicide Homicidal Ideation:  Pt denies any thoughts, plans, intent of homicide  AEB (as evidenced by): per pt report  Mental Status Examination/Evaluation: Objective:  Appearance: Casual  Eye Contact::  Good  Speech:  Clear and Coherent  Volume:  Normal  Mood:  Euthymic  Affect:  Congruent  Thought Process:  Coherent  Orientation:  Full  Thought Content:  WDL  Suicidal Thoughts:  No  Homicidal Thoughts:  No  Memory:  Immediate;   Fair Recent;   Fair Remote;   Fair  Judgement:  Impaired  Insight:  Fair  Psychomotor Activity:  Normal  Concentration:  Fair  Recall:  Fair  Akathisia:  No  Handed:  Right  AIMS (if indicated):     Assets:  Communication Skills Desire for Improvement  Sleep:  Number of Hours: 6    ROS: Neuro: no headaches, ataxia, weakness  GI: no N/V/D/cramps/constipation  MS: no weakness, muscle cramps, aches, except some pain with fractured collar bone. Will switch to Hydrocodone for pain management   Vital Signs:Blood pressure 133/85, pulse 80, temperature 97.3 F (36.3 C), temperature source Oral, resp. rate 16, height 5\' 4"  (1.626 m), weight 80.74 kg (178 lb). Current Medications: Current Facility-Administered Medications  Medication Dose Route Frequency Provider Last Rate Last Dose  . acetaminophen (TYLENOL) tablet 650 mg  650 mg Oral Q6H PRN Kerry Hough, PA      . alum & mag hydroxide-simeth (MAALOX/MYLANTA) 200-200-20 MG/5ML suspension 30 mL  30 mL Oral Q4H PRN Kerry Hough, PA      . divalproex (DEPAKOTE ER) 24  hr tablet 500 mg  500 mg Oral Daily Mike Craze, MD   500 mg at 06/13/12 0946  . HYDROcodone-acetaminophen (NORCO/VICODIN) 5-325 MG per tablet 1-2 tablet  1-2 tablet Oral Q6H PRN Mike Craze, MD      . ibuprofen (ADVIL,MOTRIN) tablet 600 mg  600 mg Oral Q8H Mike Craze, MD   600 mg at 06/13/12 1429  . magnesium hydroxide (MILK OF MAGNESIA) suspension 30 mL  30 mL Oral Daily PRN Kerry Hough, PA      . nicotine (NICODERM CQ - dosed in mg/24 hours) patch 21 mg  21 mg Transdermal Q0600 Kerry Hough, PA   21 mg at 06/13/12 0654  . oxyCODONE (Oxy IR/ROXICODONE) immediate release tablet 5 mg  5 mg Oral Q6H PRN Mike Craze, MD   5 mg at 06/13/12 0954  . QUEtiapine (SEROQUEL) tablet 100 mg  100 mg Oral QAC breakfast Mike Craze, MD   100 mg at 06/13/12 0649  . QUEtiapine (SEROQUEL) tablet 300 mg  300 mg Oral QHS Mike Craze, MD   300 mg at 06/12/12 2139  . QUEtiapine (SEROQUEL) tablet 50 mg  50 mg Oral Q1400 Mike Craze, MD   50 mg at 06/13/12 1429  . sertraline (ZOLOFT) tablet 200 mg  200 mg Oral Daily Mike Craze, MD   200 mg at 06/13/12 0946  . traZODone (DESYREL) tablet 100 mg  100 mg Oral QHS,MR X 1 Mike Craze, MD   100 mg at 06/12/12 2250  . traZODone (DESYREL) tablet 50 mg  50 mg Oral QHS PRN Kerry Hough, PA   50 mg at 06/11/12 0124    Lab Results:  No results found for this or any previous visit (from the past 48 hour(s)).  Physical Findings: AIMS: Facial and Oral Movements Muscles of Facial Expression: None, normal Lips and Perioral Area: None, normal Jaw: None, normal Tongue: None, normal,Extremity Movements Upper (arms, wrists, hands, fingers): None, normal Lower (legs, knees, ankles, toes): None, normal, Trunk Movements Neck, shoulders, hips: None, normal, Overall Severity Severity of abnormal movements (highest score from questions above): None, normal Incapacitation due to abnormal movements: None, normal Patient's awareness of abnormal  movements (rate only patient's report): No Awareness, Dental Status Current problems with teeth and/or dentures?: No Does patient usually wear dentures?: No  CIWA:  CIWA-Ar Total: 0  COWS:     Treatment Plan Summary: Daily contact with patient to assess and evaluate symptoms and progress in treatment Medication management No suicidal or homicidal thoughts for at least 48 hours. Mood/anxiety less than 3/10 where the scale is 1 is the best and 10 is the worst  Plan: Pt has done well on the opiates, had planned to switch to hydrocodone, he was unable to sleep. Have ordered hydrocodone now.  He is doing a lot better on the meds he is supposed to be on.  He will check with the shelter each day this weekend.  Will see how he does with the addition of Hydrocodone.  [name] 06/13/2012, 5:03 PM

## 2012-06-14 DIAGNOSIS — F319 Bipolar disorder, unspecified: Secondary | ICD-10-CM

## 2012-06-14 NOTE — Progress Notes (Signed)
[facility] Group Notes:  (Counselor/Nursing/MHT/Case Management/Adjunct)  06/14/2012 4:33 PM  Type of Therapy:  Group Therapy  Participation Level:  Did Not Attend    Neila Gear 06/14/2012, 4:33 PM

## 2012-06-14 NOTE — Progress Notes (Signed)
[facility] Group Notes:  (Counselor/Nursing/MHT/Case Management/Adjunct)  06/14/2012 10:37 AM  Type of Therapy:  After care Planning Group  Pt. participated in  After care planning group and was given [facility] Suicide Prevention information along with crisis numbers and agreed to use them if needed. Pt. Saw Dr. Dan Humphreys yesterday and that the doctor is still working on his medications. The Pt. denied SI/HI today.  [name] 06/14/2012, 10:37 AM

## 2012-06-14 NOTE — Progress Notes (Signed)
D Lily is seen OOB UAL on the 500 hall...toelrated well today. HE is depressed and sad, yet acts  Appropriately with staff as well as the other pts. He completed his self inventory and on it he wrote he denied SI within the past 24 hrs, he rated his depression and hopelessness "0/0" and stated his DC plan was to " continue taking my meds". A He attends his groups and takes his emds as ordered.              R Safety is in place and POC cont with therapeutic relationship already establieshed PD RN [facility]

## 2012-06-14 NOTE — Progress Notes (Signed)
[facility] MD Progress Note  06/14/2012 3:00 PM  S: "I am doing well. My mood is stable. Waiting to get out of here. I was told that there is still no beds available at the shelter. I hope I get to go to the shelter at [address]".  Diagnosis:   Axis I: Cannabis abuse, Bipolar disorder Axis II: Deferred Axis III:  Past Medical History  Diagnosis Date  . Asthma   . Bipolar depression   . Schizophrenia    Axis IV: economic problems, housing problems, occupational problems and other psychosocial or environmental problems Axis V: 65  ADL's:  Intact  Sleep: Good  Appetite:  Good  Suicidal Ideation: "No" Plan:  No Intent:  no Means:  no Homicidal Ideation: "No" Plan:  No Intent:  no Means:  no  AEB (as evidenced by): per patient's reports.  Mental Status Examination/Evaluation: Objective:  Appearance: Casual  Eye Contact::  Good  Speech:  Clear and Coherent  Volume:  Normal  Mood:  Euthymic  Affect:  Appropriate  Thought Process:  Coherent and Intact  Orientation:  Full  Thought Content:  Rumination  Suicidal Thoughts:  No  Homicidal Thoughts:  No  Memory:  Immediate;   Good Recent;   Good Remote;   Good  Judgement:  Good  Insight:  Good  Psychomotor Activity:  Normal  Concentration:  Good  Recall:  Good  Akathisia:  No  Handed:  Right  AIMS (if indicated):     Assets:  Desire for Improvement  Sleep:  Number of Hours: 6.25    Vital Signs:Blood pressure 134/75, pulse 53, temperature 96.8 F (36 C), temperature source Oral, resp. rate 16, height 5\' 4"  (1.626 m), weight 80.74 kg (178 lb). Current Medications: Current Facility-Administered Medications  Medication Dose Route Frequency Provider Last Rate Last Dose  . acetaminophen (TYLENOL) tablet 650 mg  650 mg Oral Q6H PRN Kerry Hough, PA      . alum & mag hydroxide-simeth (MAALOX/MYLANTA) 200-200-20 MG/5ML suspension 30 mL  30 mL Oral Q4H PRN Kerry Hough, PA      . divalproex (DEPAKOTE ER) 24 hr tablet  500 mg  500 mg Oral Daily Mike Craze, MD   500 mg at 06/14/12 0801  . HYDROcodone-acetaminophen (NORCO/VICODIN) 5-325 MG per tablet 1-2 tablet  1-2 tablet Oral Q6H PRN Mike Craze, MD   2 tablet at 06/14/12 0801  . ibuprofen (ADVIL,MOTRIN) tablet 600 mg  600 mg Oral Q8H Mike Craze, MD   600 mg at 06/14/12 1478  . magnesium hydroxide (MILK OF MAGNESIA) suspension 30 mL  30 mL Oral Daily PRN Kerry Hough, PA      . nicotine (NICODERM CQ - dosed in mg/24 hours) patch 21 mg  21 mg Transdermal Q0600 Kerry Hough, PA   21 mg at 06/14/12 0605  . QUEtiapine (SEROQUEL) tablet 100 mg  100 mg Oral QAC breakfast Mike Craze, MD   100 mg at 06/14/12 2956  . QUEtiapine (SEROQUEL) tablet 300 mg  300 mg Oral QHS Mike Craze, MD   300 mg at 06/13/12 2055  . QUEtiapine (SEROQUEL) tablet 50 mg  50 mg Oral Q1400 Mike Craze, MD   50 mg at 06/13/12 1429  . sertraline (ZOLOFT) tablet 200 mg  200 mg Oral Daily Mike Craze, MD   200 mg at 06/14/12 0801  . traZODone (DESYREL) tablet 100 mg  100 mg Oral QHS,MR X 1 Mike Craze,  MD   100 mg at 06/13/12 2228  . traZODone (DESYREL) tablet 50 mg  50 mg Oral QHS PRN Kerry Hough, PA   50 mg at 06/11/12 0124    Lab Results: No results found for this or any previous visit (from the past 48 hour(s)).  Physical Findings: AIMS: Facial and Oral Movements Muscles of Facial Expression: None, normal Lips and Perioral Area: None, normal Jaw: None, normal Tongue: None, normal,Extremity Movements Upper (arms, wrists, hands, fingers): None, normal Lower (legs, knees, ankles, toes): None, normal, Trunk Movements Neck, shoulders, hips: None, normal, Overall Severity Severity of abnormal movements (highest score from questions above): None, normal Incapacitation due to abnormal movements: None, normal Patient's awareness of abnormal movements (rate only patient's report): No Awareness, Dental Status Current problems with teeth and/or dentures?:  No Does patient usually wear dentures?: No  CIWA:  CIWA-Ar Total: 0  COWS:     Treatment Plan Summary: Daily contact with patient to assess and evaluate symptoms and progress in treatment Medication management Patient unable to discharge today, shelter has no beds.  Plan: Still awaiting a bed at the shelter for patient to go to after discharge. Continue current treatment plan.   Armandina Stammer I 06/14/2012, 3:00 PM

## 2012-06-14 NOTE — Progress Notes (Signed)
[facility] Group Notes:  (Counselor/Nursing/MHT/Case Management/Adjunct)  06/14/2012 11:58 PM  Type of Therapy:  Psychoeducational Skills  Participation Level:  Active  Participation Quality:  Appropriate  Affect:  Appropriate  Cognitive:  Appropriate  Insight:  Good  Engagement in Group:  Good  Engagement in Therapy:  Good  Modes of Intervention:  Education  Summary of Progress/Problems: The pt. Attended the A.A. Meeting on the 300 hallway.    Hazle Coca S 06/14/2012, 11:58 PM

## 2012-06-14 NOTE — Progress Notes (Signed)
Psychoeducational Group Note  Date:  06/14/2012 Time:  1015    [employer] Group Topic/Focus:    Participation Level: Did Not Attend  Participation Quality:  Not Applicable  Affect:  Not Applicable  Cognitive:  Not Applicable  Insight:  Not Applicable  Engagement in Group: Not Applicable  Additional Comments:   [name] 06/14/2012, 11:44 AM

## 2012-06-14 NOTE — Progress Notes (Signed)
Patient ID: [name], male   DOB: [birth year], 21 y.o.   MRN: [id number]  Problem: Bipolar, Schizophrenia, Marijuana Abuse  D: Pt pleasant, out in milieu interacting well with peers.  A: Monitor patient Q 15 minutes for safety, encourage staff/peer interaction, administer medications as ordered by MD.  R: Pt compliant with medications, participated well in group session. No inappropriate behaviors noted. Pt denies SI or plans to harm himself.

## 2012-06-15 NOTE — Progress Notes (Signed)
[facility] MD Progress Note  06/15/2012 2:19 PM  S: "I'm doing well. Still waiting for bed availability at the shelter. I have no bad thoughts and or feelings".  Diagnosis:   Axis I: Cannabis abuse, Bipolar depression, Cannabis abuse Axis II: Deferred Axis III:  Past Medical History  Diagnosis Date  . Asthma   . Bipolar depression   . Schizophrenia    Axis IV: economic problems, educational problems, housing problems, occupational problems and other psychosocial or environmental problems Axis V: Stable  ADL's:  Intact  Sleep: Good  Appetite:  Good  Suicidal Ideation: "No" Plan:  No Intent:  No Means:  no Homicidal Ideation: "No" Plan:  No Intent:  no Means:  no  AEB (as evidenced by): per patient's reports.  Mental Status Examination/Evaluation: Objective:  Appearance: Casual  Eye Contact::  Good  Speech:  Clear and Coherent  Volume:  Normal  Mood:  Euthymic  Affect:  Appropriate  Thought Process:  Coherent and Intact  Orientation:  Full  Thought Content:  Rumination  Suicidal Thoughts:  No  Homicidal Thoughts:  No  Memory:  Immediate;   Good Recent;   Good Remote;   Good  Judgement:  Good  Insight:  Good  Psychomotor Activity:  Normal  Concentration:  Good  Recall:  Good  Akathisia:  No  Handed:  Right  AIMS (if indicated):     Assets:  Desire for Improvement  Sleep:  Number of Hours: 6    Vital Signs:Blood pressure 141/86, pulse 56, temperature 96.5 F (35.8 C), temperature source Oral, resp. rate 20, height 5\' 4"  (1.626 m), weight 80.74 kg (178 lb). Current Medications: Current Facility-Administered Medications  Medication Dose Route Frequency Provider Last Rate Last Dose  . acetaminophen (TYLENOL) tablet 650 mg  650 mg Oral Q6H PRN Kerry Hough, PA      . alum & mag hydroxide-simeth (MAALOX/MYLANTA) 200-200-20 MG/5ML suspension 30 mL  30 mL Oral Q4H PRN Kerry Hough, PA      . divalproex (DEPAKOTE ER) 24 hr tablet 500 mg  500 mg Oral Daily Mike Craze, MD   500 mg at 06/15/12 0751  . HYDROcodone-acetaminophen (NORCO/VICODIN) 5-325 MG per tablet 1-2 tablet  1-2 tablet Oral Q6H PRN Mike Craze, MD   2 tablet at 06/15/12 0751  . ibuprofen (ADVIL,MOTRIN) tablet 600 mg  600 mg Oral Q8H Mike Craze, MD   600 mg at 06/15/12 2130  . magnesium hydroxide (MILK OF MAGNESIA) suspension 30 mL  30 mL Oral Daily PRN Kerry Hough, PA      . nicotine (NICODERM CQ - dosed in mg/24 hours) patch 21 mg  21 mg Transdermal Q0600 Kerry Hough, PA   21 mg at 06/15/12 0616  . QUEtiapine (SEROQUEL) tablet 100 mg  100 mg Oral QAC breakfast Mike Craze, MD   100 mg at 06/15/12 8657  . QUEtiapine (SEROQUEL) tablet 300 mg  300 mg Oral QHS Mike Craze, MD   300 mg at 06/14/12 2131  . QUEtiapine (SEROQUEL) tablet 50 mg  50 mg Oral Q1400 Mike Craze, MD   50 mg at 06/14/12 1601  . sertraline (ZOLOFT) tablet 200 mg  200 mg Oral Daily Mike Craze, MD   200 mg at 06/15/12 0751  . traZODone (DESYREL) tablet 100 mg  100 mg Oral QHS,MR X 1 Mike Craze, MD   100 mg at 06/14/12 2248  . traZODone (DESYREL) tablet 50 mg  50 mg Oral QHS PRN Kerry Hough, PA   50 mg at 06/11/12 0124    Lab Results: No results found for this or any previous visit (from the past 48 hour(s)).  Physical Findings: AIMS: Facial and Oral Movements Muscles of Facial Expression: None, normal Lips and Perioral Area: None, normal Jaw: None, normal Tongue: None, normal,Extremity Movements Upper (arms, wrists, hands, fingers): None, normal Lower (legs, knees, ankles, toes): None, normal, Trunk Movements Neck, shoulders, hips: None, normal, Overall Severity Severity of abnormal movements (highest score from questions above): None, normal Incapacitation due to abnormal movements: None, normal Patient's awareness of abnormal movements (rate only patient's report): No Awareness, Dental Status Current problems with teeth and/or dentures?: No Does patient usually wear  dentures?: No  CIWA:  CIWA-Ar Total: 0  COWS:     Treatment Plan Summary: Daily contact with patient to assess and evaluate symptoms and progress in treatment Medication management  Plan: Still awaiting bed availability at the shelter. Continue current treatment plan.  [name] I 06/15/2012, 2:19 PM

## 2012-06-15 NOTE — Progress Notes (Signed)
Patient has been up and active on the unit, attended AA/NA group. Patient voiced no complaints but did inquire as to when his next dose of pain medication is due. Patient reports pain at a ten but has been up in the dayroom watching tv and talking with peers has not appeared to be in pain that he rates a 10. Tylenol offered but patient reports he will wait until his hs meds. Patient currently denies si/hi/a/v hall.

## 2012-06-15 NOTE — Progress Notes (Signed)
Progress note was performed by physician extender and as a supervising MD, available for assistance near by during this rounds. Agree with the findings.   

## 2012-06-15 NOTE — Progress Notes (Signed)
Psychoeducational Group Note  Date:  06/15/2012 Time:  0930  Group Topic/Focus:  Spirituality:   The focus of this group is to discuss how one's spirituality can aide in recovery.  Participation Level:  Did Not Attend  Participation Quality:  Appropriate  Affect:  Appropriate  Cognitive:  Appropriate  Insight:  Did not attend  Engagement in Group:  Did not attend  Additional Comments:  Did not attend   Meredith Staggers 06/15/2012, 11:21 AM

## 2012-06-15 NOTE — Progress Notes (Signed)
[facility] Adult Inpatient Family/Significant Other Suicide Prevention Education  Suicide Prevention Education:  Contact Attempts: Judeth Cornfield [phone]- unable to leave message- has been identified by the patient as the family member/significant other with whom the patient will be residing, and identified as the person(s) who will aid the patient in the event of a mental health crisis.  With written consent from the patient, two attempts were made to provide suicide prevention education, prior to and/or following the patient's discharge.  We were unsuccessful in providing suicide prevention education.  A suicide education pamphlet was given to the patient to share with family/significant other.  Date and time of first attempt:  By Lamar Blinks on 06/15/12 at 11:55 am Date and time of second attempt:  Neila Gear 06/15/2012, 11:54 AM

## 2012-06-15 NOTE — Progress Notes (Signed)
[facility] Adult Inpatient Family/Significant Other Suicide Prevention Education  Suicide Prevention Education:  Education Completed; [name] [phone]( her brothers number leave a message to speak to her) or call [phone]- has been identified by the patient as the family member/significant other with whom the patient will be residing, and identified as the person(s) who will aid the patient in the event of a mental health crisis (suicidal ideations/suicide attempt).  With written consent from the patient, the family member/significant other has been provided the following suicide prevention education, prior to the and/or following the discharge of the patient.  The suicide prevention education provided includes the following:  Suicide risk factors  Suicide prevention and interventions  National Suicide Hotline telephone number  [facility] assessment telephone number  [facility] Emergency Assistance 911  [facility] and/or Residential Mobile Crisis Unit telephone number  Request made of family/significant other to:  Remove weapons (e.g., guns, rifles, knives), all items previously/currently identified as safety concern. Pt.'s girlfriend states they are not together any more and that the pt is homeless. Pt.'s ex girlfriend states that the pt.  Does not have any guns that she is aware of.    Remove drugs/medications (over-the-counter, prescriptions, illicit drugs), all items previously/currently identified as a safety concern.Pt.'s girlfriend state that she knows of no prior attempts at [facility] by the pt. And feels the pt. Is going through a hard time since they broke  Recently. Pt.'s girlfriend had no concerns about medications.  Pt.'s girlfriend states that pt. Can see his daughter anytime he wants but states she left the pt. Due to him not having a job and not acting like a adult. She wants pt. To get help. She can be reached at number above. The family  member/significant other verbalizes understanding of the suicide prevention education information provided.  The family member/significant other agrees to remove the items of safety concern listed above.  [name] [place] 06/15/2012, 3:57 PM

## 2012-06-15 NOTE — Progress Notes (Signed)
Progress note was performed by physician extender and as a supervising MD, available for assistance near by during this rounds.  

## 2012-06-15 NOTE — Progress Notes (Signed)
[facility] Group Notes:  (Counselor/Nursing/MHT/Case Management/Adjunct)  06/15/2012 5:24 PM  Type of Therapy:  Group Therapy  Participation Level:  Active  Participation Quality:  Appropriate and Attentive  Affect:  Appropriate  Cognitive:  Appropriate  Insight:  Limited  Engagement in Group:  Good  Engagement in Therapy:  Good  Modes of Intervention:  Clarification, Education, Socialization and Support  Summary of Progress/Problems: Pt. participated in group on health support systems and how  they(patients) can support themselves when their supports are not around. The patients also shared what the word support " meant to them".  Each pt. Also shared who they had as a support in their life and participated in a support activity and what it me and to actually feel support from someone. Pt. Sated that he has no supports and was encouraged to seek support from support groups and learn how to support himself.   [name] 06/15/2012, 5:24 PM

## 2012-06-15 NOTE — Progress Notes (Addendum)
[name] is seen OOB UAL on the 500 hall..he interacts with the other patients.Sometimes joking with male patients and  nursing students . He verbalizes  his need for his pain medication...around the-clock.yet he is seen laughing and joking immediately before he presents to the med window, requesting it.                             A He takes his medications as ordered. He sleeps frequently throughout the day and demonstrates even minimal engagement into his recovery.                           R Safety is in place and POC includes continuing to foster therapeutic relationship. PD RN [facility]

## 2012-06-16 ENCOUNTER — Encounter (HOSPITAL_COMMUNITY): Payer: Self-pay | Admitting: Adult Health

## 2012-06-16 ENCOUNTER — Emergency Department (HOSPITAL_COMMUNITY)
Admission: EM | Admit: 2012-06-16 | Discharge: 2012-06-17 | Disposition: A | Discharge status: Other Acute Inpatient Hospital | Payer: Medicaid Other | Attending: Emergency Medicine | Admitting: Emergency Medicine

## 2012-06-16 DIAGNOSIS — R45851 Suicidal ideations: Secondary | ICD-10-CM | POA: Insufficient documentation

## 2012-06-16 DIAGNOSIS — Z8659 Personal history of other mental and behavioral disorders: Secondary | ICD-10-CM | POA: Insufficient documentation

## 2012-06-16 DIAGNOSIS — F17200 Nicotine dependence, unspecified, uncomplicated: Secondary | ICD-10-CM | POA: Insufficient documentation

## 2012-06-16 DIAGNOSIS — J45909 Unspecified asthma, uncomplicated: Secondary | ICD-10-CM | POA: Insufficient documentation

## 2012-06-16 MED ORDER — HYDROCODONE-ACETAMINOPHEN 5-325 MG PO TABS: 1.0000 | ORAL_TABLET | Freq: Three times a day (TID) | ORAL | Status: AC | PRN

## 2012-06-16 MED ORDER — TRAZODONE HCL 100 MG PO TABS
200.0000 mg | ORAL_TABLET | Freq: Every day | ORAL | Status: DC
  Filled 2012-06-16: qty 14

## 2012-06-16 MED ORDER — IBUPROFEN 600 MG PO TABS: 600.0000 mg | ORAL_TABLET | Freq: Three times a day (TID) | ORAL | Status: AC

## 2012-06-16 MED ORDER — QUETIAPINE FUMARATE 100 MG PO TABS: ORAL_TABLET | ORAL | Status: DC

## 2012-06-16 MED ORDER — DIVALPROEX SODIUM ER 500 MG PO TB24: 500.0000 mg | ORAL_TABLET | Freq: Every day | ORAL | Status: DC

## 2012-06-16 MED ORDER — SERTRALINE HCL 100 MG PO TABS: 200.0000 mg | ORAL_TABLET | Freq: Every day | ORAL | Status: DC

## 2012-06-16 MED ORDER — NICOTINE 21 MG/24HR TD PT24: 1.0000 | MEDICATED_PATCH | Freq: Every day | TRANSDERMAL | Status: DC

## 2012-06-16 MED ORDER — TRAZODONE HCL 100 MG PO TABS: 200.0000 mg | ORAL_TABLET | Freq: Every day | ORAL | Status: DC
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
## 2012-06-16 NOTE — Progress Notes (Signed)
Discharge Note:   Patient stated he is taking a bus downtown and will get a hotel room.   Patient denied SI and HI.   Denied A/V hallucinations.   Denied pain at this time.  Patient stated he appreciates all the staff has done to assist him while at [facility].  Stated he received all his belongings from his locker which are tennis shoes, phone, water bottle; medications, discharge instructions.
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
## 2012-06-16 NOTE — Progress Notes (Signed)
Psychoeducational Group Note  Date:  06/16/2012 Time:  1100  Group Topic/Focus:  Wellness Toolbox:   The focus of this group is to discuss various aspects of wellness, balancing those aspects and exploring ways to increase the ability to experience wellness.  Patients will create a wellness toolbox for use upon discharge.  Participation Level:  Did Not Attend  Participation Quality:    Affect:    Cognitive:    Insight:    Engagement in Group:    Additional Comments:  Pt refused,pt was sleeping.  Isla Pence M 06/16/2012, 12:09 PM

## 2012-06-16 NOTE — Progress Notes (Signed)
Patient has been observed in the dayroom watching tv, laughing and interacting with select peers. Patient came to medication window and requested pain medication for pain he rated an 8. Pain medication and hs meds given. Writer inquired as to what his plans were for discharge and patient reported that currently he does not have a place to live. Patient reports that he will probably be here for a while until he finds a place to stay. Support and encouragement offered, safety maintained on unit, will continue to monitor.

## 2012-06-16 NOTE — Progress Notes (Signed)
[facility] Group Notes:  (Counselor/Nursing/MHT/Case Management/Adjunct)  06/16/2012 2:26 AM  Type of Therapy:  Psychoeducational Skills  Participation Level:  Active  Participation Quality:  Appropriate  Affect:  Appropriate  Cognitive:  Appropriate  Insight:  Good  Engagement in Group:  Good  Engagement in Therapy:  Good  Modes of Intervention:  Support  Summary of Progress/Problems: Pt stated that his goal for the coming week and beyond was to get a job, which he had a lead on from a peer. Pt stated that he felt having a job would right many of his current problems.   Christ Kick 06/16/2012, 2:26 AM
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
## 2012-06-16 NOTE — Tx Team (Signed)
Interdisciplinary Treatment Plan Update (Adult)  Date:  06/16/2012  Time Reviewed:  11:04 AM   Progress in Treatment: Attending groups: Yes Participating in groups:  Yes Taking medication as prescribed:  Yes Tolerating medication: Yes Family/Significant othe contact made:  Yes, contact made with: Luna Kitchens Patient understands diagnosis: Yes Discussing patient identified problems/goals with staff:  Yes Medical problems stabilized or resolved: Yes Denies suicidal/homicidal ideation: Yes Issues/concerns per patient self-inventory:  No  Other:  New problem(s) identified: None  Reason for Continuation of Hospitalization: Appropriate for discharge today  Interventions implemented related to continuation of hospitalization:  Medication stabilization, safety checks q 15 mins, group attendance  Additional comments:  Estimated length of stay: discharge today  Discharge Plan: [place] will discharge and follow up with [facility]  New goal(s):  Review of initial/current patient goals per problem list:   1. Goal(s): Eliminate SI/other thoughts of self harm  Met: Yes Target date: d/c  As evidenced by:[name] denies any thoughts of self-harm or suicide   2. Goal (s): Reduce depression/anxiety to rating of 4 or less Met: Yes Target date: d/c  As evidenced by: [name] reports depression and anxiety are both at 1 today   3. Goal(s): .stabilize on meds  Met:Yes Target date: d/c  As evidenced by: [name] reports symptoms have decreased with no intolerable side effects   4. Goal(s): Refer for outpatient follow up  Met: Yes  Target date: d/c  As evidenced by: [name] is scheduled for follow up with [facility]  Attendees: Patient:  [name] 06/16/2012 11:04 AM  Family:     Physician:  Dr Orson Aloe, MD 06/16/2012 11:04 AM  Nursing:   Pixie Casino, RN 06/16/2012 11:04 AM  Case Manager:  Juline Patch, LCSW 06/16/2012 11:04 AM  Counselor:  Sanjiv Palms, LCSW 06/16/2012 11:04 AM    Other:  Reyes Ivan, LCSWA 06/16/2012 11:04 AM  Other:  Quintella Reichert, RN 06/16/2012 11:04 AM  Other:     Other:      Scribe for Treatment Team:   Billie Lade, 06/16/2012 11:04 AM
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
## 2012-06-16 NOTE — ED Notes (Signed)
Pt reports SI thoughts since this afternoon and states, "I have attempted to hang myself twice but I was stopped by a friend."  Avoids eye contact, stoic demeanor.

## 2012-06-17 LAB — RAPID URINE DRUG SCREEN, HOSP PERFORMED
Amphetamines: NOT DETECTED
Benzodiazepines: POSITIVE — AB
Tetrahydrocannabinol: NOT DETECTED

## 2012-06-17 LAB — CBC WITH DIFFERENTIAL/PLATELET
Basophils Absolute: 0 10*3/uL (ref 0.0–0.1)
Basophils Relative: 0 % (ref 0–1)
Eosinophils Absolute: 0 10*3/uL (ref 0.0–0.7)
Eosinophils Relative: 0 % (ref 0–5)
Lymphs Abs: 1.9 10*3/uL (ref 0.7–4.0)
MCH: 28.9 pg (ref 26.0–34.0)
MCHC: 33.2 g/dL (ref 30.0–36.0)
Neutrophils Relative %: 69 % (ref 43–77)
Platelets: 222 10*3/uL (ref 150–400)
RBC: 4.54 MIL/uL (ref 4.22–5.81)
RDW: 13.7 % (ref 11.5–15.5)

## 2012-06-17 LAB — COMPREHENSIVE METABOLIC PANEL
ALT: 25 U/L (ref 0–53)
AST: 28 U/L (ref 0–37)
Albumin: 4.4 g/dL (ref 3.5–5.2)
Alkaline Phosphatase: 79 U/L (ref 39–117)
Calcium: 10.3 mg/dL (ref 8.4–10.5)
Potassium: 4.4 mEq/L (ref 3.5–5.1)
Sodium: 139 mEq/L (ref 135–145)
Total Protein: 7.5 g/dL (ref 6.0–8.3)

## 2012-06-17 LAB — ETHANOL: Alcohol, Ethyl (B): <11 mg/dL (ref 0–11)

## 2012-06-17 LAB — ACETAMINOPHEN LEVEL: Acetaminophen (Tylenol), Serum: <15 ug/mL (ref 10–30)

## 2012-06-17 MED ORDER — ONDANSETRON HCL 8 MG PO TABS: 4.0000 mg | ORAL_TABLET | Freq: Three times a day (TID) | ORAL | Status: DC | PRN

## 2012-06-17 MED ORDER — IBUPROFEN 200 MG PO TABS: 600.0000 mg | ORAL_TABLET | Freq: Three times a day (TID) | ORAL | Status: DC | PRN

## 2012-06-17 MED ORDER — QUETIAPINE FUMARATE 50 MG PO TABS
100.0000 mg | ORAL_TABLET | Freq: Every day | ORAL | Status: DC
  Filled 2012-06-17: qty 2

## 2012-06-17 MED ORDER — TRAZODONE HCL 100 MG PO TABS
200.0000 mg | ORAL_TABLET | Freq: Every day | ORAL | Status: DC
  Filled 2012-06-17: qty 2

## 2012-06-17 MED ORDER — ALUM & MAG HYDROXIDE-SIMETH 200-200-20 MG/5ML PO SUSP: 30.0000 mL | ORAL | Status: DC | PRN

## 2012-06-17 MED ORDER — ACETAMINOPHEN 325 MG PO TABS
650.0000 mg | ORAL_TABLET | ORAL | Status: DC | PRN
  Administered 2012-06-17: 650 mg via ORAL
  Filled 2012-06-17: qty 2

## 2012-06-17 MED ORDER — LORAZEPAM 1 MG PO TABS: 1.0000 mg | ORAL_TABLET | Freq: Three times a day (TID) | ORAL | Status: DC | PRN

## 2012-06-17 MED ORDER — DIVALPROEX SODIUM ER 500 MG PO TB24
500.0000 mg | ORAL_TABLET | Freq: Every day | ORAL | Status: DC
  Administered 2012-06-17: 500 mg via ORAL
  Filled 2012-06-17: qty 1

## 2012-06-17 MED ORDER — SERTRALINE HCL 100 MG PO TABS
200.0000 mg | ORAL_TABLET | Freq: Every day | ORAL | Status: DC
  Administered 2012-06-17: 100 mg via ORAL
  Filled 2012-06-17: qty 2

## 2012-06-17 MED ORDER — ZOLPIDEM TARTRATE 5 MG PO TABS: 5.0000 mg | ORAL_TABLET | Freq: Every evening | ORAL | Status: DC | PRN

## 2012-06-17 MED ORDER — NICOTINE 21 MG/24HR TD PT24
21.0000 mg | MEDICATED_PATCH | Freq: Every day | TRANSDERMAL | Status: DC
  Administered 2012-06-17: 21 mg via TRANSDERMAL
  Filled 2012-06-17: qty 1

## 2012-06-17 NOTE — ED Notes (Signed)
Pt states he was recently d/c from [facility] today, went home and attempted to hang himself, this was his plan last week as well.  States friend stopped him.  States he feels anxious and depressed, no HI thoughts, still expresses SI thoughts.

## 2012-06-17 NOTE — ED Notes (Signed)
Sitting with pt now until sitter arrives

## 2012-06-17 NOTE — ED Notes (Signed)
Relief sitter relieving sitter for lunch break

## 2012-06-17 NOTE — ED Provider Notes (Addendum)
Filed Vitals:   06/17/12 0625  BP: 113/48  Pulse: 77  Temp: 97.4 F (36.3 C)  Resp: 18   Awaiting assessment.  No overnight issues.  4:38 PM Accepted to [facility] accepted to [facility] by Dr. Otelia Santee. Discharged in good condition.  Cyndra Numbers, MD 06/17/12 1610  Cyndra Numbers, MD 06/17/12 [phone]
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
## 2012-06-17 NOTE — ED Provider Notes (Addendum)
History   Scribed for Gwyneth Sprout, MD, the patient was seen in room TR09C/TR09C . This chart was scribed by Lewanda Rife.   CSN: [id number]  Arrival date & time 06/16/12  2259   First MD Initiated Contact with Patient 06/16/12 2334      Chief Complaint  Patient presents with  . Suicidal    (Consider location/radiation/quality/duration/timing/severity/associated sxs/prior treatment) HPI [name] is a 21 y.o. male who presents to the Emergency Department complaining of suicidal ideations since this afternoon after being discharged from behavorial health. Pt states he started to feel depressed when his "baby mama called and talked about her new boyfriend." Pt states he attempted to hang himself twice today, but was stopped by a friend. Pt denies drinking alcohol after being d/c from [facility], but admits taking a valium to calm down.   Past Medical History  Diagnosis Date  . Asthma   . Bipolar depression   . Schizophrenia     History reviewed. No pertinent past surgical history.  History reviewed. No pertinent family history.  History  Substance Use Topics  . Smoking status: Current Everyday Smoker -- 1.0 packs/day for 13 years  . Smokeless tobacco: Not on file  . Alcohol Use: No      Review of Systems  Psychiatric/Behavioral: Positive for suicidal ideas.  All other systems reviewed and are negative.    Allergies  Review of patient's allergies indicates no known allergies.  Home Medications   Current Outpatient Rx  Name Route Sig Dispense Refill  . DIVALPROEX SODIUM ER 500 MG PO TB24 Oral Take 1 tablet (500 mg total) by mouth daily. For mood control. 30 tablet 0  . HYDROCODONE-ACETAMINOPHEN 5-325 MG PO TABS Oral Take 1 tablet by mouth every 8 (eight) hours as needed for pain. For pain, will have to stop and shift to Motrin when this runs out. 12 tablet 0  . IBUPROFEN 600 MG PO TABS Oral Take 1 tablet (600 mg total) by mouth every 8 (eight)  hours. For pain after the Norco runs out. 30 tablet 0  . QUETIAPINE FUMARATE 100 MG PO TABS  Take by mouth ONE at breakfast, 1/2 at 3 PM, and THREE at bedtime for mood control and insomnia 135 tablet 0  . SERTRALINE HCL 100 MG PO TABS Oral Take 2 tablets (200 mg total) by mouth daily. For depression. 60 tablet 0  . TRAZODONE HCL 100 MG PO TABS Oral Take 2 tablets (200 mg total) by mouth at bedtime. For insomnia. 60 tablet 0    BP 121/70  Pulse 117  Temp 98.2 F (36.8 C) (Oral)  Resp 18  SpO2 96%  Physical Exam  Constitutional: He is oriented to person, place, and time. He appears well-developed and well-nourished.  HENT:  Head: Normocephalic and atraumatic.  Eyes: Pupils are equal, round, and reactive to light.  Neck: Normal range of motion. Neck supple.  Cardiovascular: Normal rate, regular rhythm and normal heart sounds.   Pulmonary/Chest: Effort normal and breath sounds normal.  Abdominal: Soft. Bowel sounds are normal.  Musculoskeletal: Normal range of motion.  Neurological: He is alert and oriented to person, place, and time.  Skin: Skin is warm and dry.  Psychiatric: He has a normal mood and affect. His behavior is normal.    ED Course  Procedures (including critical care time)  Labs Reviewed  URINE RAPID DRUG SCREEN (HOSP PERFORMED) - Abnormal; Notable for the following:    Opiates POSITIVE (*)     Benzodiazepines  POSITIVE (*)     All other components within normal limits  CBC WITH DIFFERENTIAL  COMPREHENSIVE METABOLIC PANEL  ETHANOL  ACETAMINOPHEN LEVEL   No results found.   1. Suicidal behavior       MDM   Patient was just discharged from behavioral health today. He returns because he states he was not ready to leave and he is still suicidal. He spoke with his babies mother today which caused him to become even more suicidal.  He states he took one Valium his friend gave him to help him calm down but that he tried to hang himself twice but his friends  stopped him. He has a long history of bipolar disease and schizophrenia states that he still taking his medications. Denies illicit substance use or alcohol use. He is medically cleared and he will be evaluated by the ACT team   I personally performed the services described in this documentation, which was scribed in my presence.  The recorded information has been reviewed and considered.    Gwyneth Sprout, MD 06/17/12 [id number]  Gwyneth Sprout, MD 06/17/12 [phone]

## 2012-06-17 NOTE — ED Notes (Signed)
Sitter has returned 

## 2012-06-17 NOTE — ED Notes (Signed)
Sitter has arrived; pt given a cup of ice with coke

## 2012-06-17 NOTE — ED Notes (Addendum)
Pt continues to snore.  Pt placed directly across from nurses station.  Will move to video room when he awakes.  (On 8/26 pt was given 14 100 mg trazadone tablets and told to take 200 mg before bedtime.  There are only 8 tablets left.)  Also, pt given ibuprofen to take "once hydrocodone is finished".  No hydrocodone found with other pills.

## 2012-06-17 NOTE — ED Notes (Signed)
Laying on Doctor, general practice, watching television
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
## 2012-06-17 NOTE — BH Assessment (Signed)
Assessment Note  Update:  Received a call from [facility] at [facility] stating pt accepted to Dr. Otelia Santee @ [phone].  Updated EDP Hunt and ED staff.  Updated assessment notification, completed assessment notification and faxed to [facility] to log.  ED staff to arrange transport via [facility] to [facility], as pt is voluntary.     Disposition:  Disposition Disposition of Patient: Inpatient treatment program Type of inpatient treatment program: Adult Patient referred to: Other (Comment) (Pt accepted [facility])  On Site Evaluation by:   Reviewed with Physician:  Patsy Lager 06/17/2012 2:43 PM

## 2012-06-17 NOTE — ED Notes (Signed)
Report given to Brenda, RN

## 2012-06-18 NOTE — Progress Notes (Signed)
Patient Discharge Instructions:  After Visit Summary (AVS):   Faxed to:  06/18/2012 Psychiatric Admission Assessment Note:   Faxed to:  06/18/2012 Suicide Risk Assessment - Discharge Assessment:   Faxed to:  06/18/2012 Faxed/Sent to the Next Level Care provider:  06/18/2012  Faxed to [facility] @ [phone]  Wandra Scot, 06/18/2012, 4:47 PM

## 2012-08-25 ENCOUNTER — Encounter (HOSPITAL_COMMUNITY): Payer: Self-pay | Admitting: Emergency Medicine

## 2012-08-25 ENCOUNTER — Emergency Department (HOSPITAL_COMMUNITY)
Admission: EM | Admit: 2012-08-25 | Discharge: 2012-08-27 | Disposition: A | Discharge status: Other Acute Inpatient Hospital | Payer: Medicaid Other | Attending: Emergency Medicine | Admitting: Emergency Medicine

## 2012-08-25 DIAGNOSIS — Z79899 Other long term (current) drug therapy: Secondary | ICD-10-CM | POA: Insufficient documentation

## 2012-08-25 DIAGNOSIS — F209 Schizophrenia, unspecified: Secondary | ICD-10-CM | POA: Insufficient documentation

## 2012-08-25 DIAGNOSIS — F3130 Bipolar disorder, current episode depressed, mild or moderate severity, unspecified: Secondary | ICD-10-CM | POA: Insufficient documentation

## 2012-08-25 DIAGNOSIS — R45851 Suicidal ideations: Secondary | ICD-10-CM | POA: Insufficient documentation

## 2012-08-25 DIAGNOSIS — J45909 Unspecified asthma, uncomplicated: Secondary | ICD-10-CM | POA: Insufficient documentation

## 2012-08-25 DIAGNOSIS — F17200 Nicotine dependence, unspecified, uncomplicated: Secondary | ICD-10-CM | POA: Insufficient documentation

## 2012-08-25 LAB — COMPREHENSIVE METABOLIC PANEL
CO2: 32 mEq/L (ref 19–32)
Calcium: 10 mg/dL (ref 8.4–10.5)
Creatinine, Ser: 0.68 mg/dL (ref 0.50–1.35)
GFR calc Af Amer: >90 mL/min (ref >90)
GFR calc non Af Amer: >90 mL/min (ref >90)
Glucose, Bld: 85 mg/dL (ref 70–99)
Total Bilirubin: 0.3 mg/dL (ref 0.3–1.2)

## 2012-08-25 LAB — CBC
HCT: 44.6 % (ref 39.0–52.0)
Hemoglobin: 14.8 g/dL (ref 13.0–17.0)
MCH: 29.8 pg (ref 26.0–34.0)
MCV: 89.7 fL (ref 78.0–100.0)
RBC: 4.97 MIL/uL (ref 4.22–5.81)

## 2012-08-25 LAB — RAPID URINE DRUG SCREEN, HOSP PERFORMED
Opiates: NOT DETECTED
Tetrahydrocannabinol: POSITIVE — AB

## 2012-08-25 LAB — SALICYLATE LEVEL: Salicylate Lvl: <2 mg/dL — ABNORMAL LOW (ref 2.8–20.0)

## 2012-08-25 MED ORDER — TRAZODONE HCL 50 MG PO TABS
200.0000 mg | ORAL_TABLET | Freq: Every day | ORAL | Status: DC
  Administered 2012-08-26: 200 mg via ORAL
  Filled 2012-08-25: qty 4

## 2012-08-25 MED ORDER — DIAZEPAM 5 MG PO TABS
10.0000 mg | ORAL_TABLET | Freq: Two times a day (BID) | ORAL | Status: DC | PRN
  Administered 2012-08-26: 10 mg via ORAL
  Filled 2012-08-25: qty 2

## 2012-08-25 MED ORDER — DIVALPROEX SODIUM ER 500 MG PO TB24
500.0000 mg | ORAL_TABLET | Freq: Every day | ORAL | Status: DC
  Administered 2012-08-25 – 2012-08-26 (×2): 500 mg via ORAL
  Filled 2012-08-25 (×2): qty 1

## 2012-08-25 MED ORDER — QUETIAPINE FUMARATE 50 MG PO TABS
100.0000 mg | ORAL_TABLET | Freq: Every day | ORAL | Status: DC
Start: 2012-08-25 — End: 2012-08-27
  Administered 2012-08-26 (×2): 100 mg via ORAL
  Filled 2012-08-25 (×2): qty 2

## 2012-08-25 NOTE — ED Notes (Signed)
pts belongings locked in locker 12

## 2012-08-25 NOTE — ED Provider Notes (Signed)
History   This chart was scribed for Gerhard Munch, MD by Gerlean Ren. This patient was seen in room TR05C/TR05C and the patient's care was started at 19:36.   CSN: [id number]  Arrival date & time 08/25/12  1821   None     Chief Complaint  Patient presents with  . Medical Clearance  . Suicidal    (Consider location/radiation/quality/duration/timing/severity/associated sxs/prior treatment) The history is provided by the patient. No language interpreter was used.   [name] is a 21 y.o. male who presents to the Emergency Department complaining of suicidal thoughts and 2 anxiety attacks today.  Pt reports a suicide attempt 2 weeks ago and that he has been hospitalized in Behavioral Health for same problems 2 months ago.    Past Medical History  Diagnosis Date  . Asthma   . Bipolar depression   . Schizophrenia     History reviewed. No pertinent past surgical history.  History reviewed. No pertinent family history.  History  Substance Use Topics  . Smoking status: Current Every Day Smoker -- 1.0 packs/day for 13 years  . Smokeless tobacco: Not on file  . Alcohol Use: No      Review of Systems  Constitutional:       Per HPI, otherwise negative  HENT:       Per HPI, otherwise negative  Eyes: Negative.   Respiratory:       Per HPI, otherwise negative  Cardiovascular:       Per HPI, otherwise negative  Gastrointestinal: Negative for vomiting.  Genitourinary: Negative.   Musculoskeletal:       Per HPI, otherwise negative  Skin: Negative.   Neurological: Negative for syncope.  Psychiatric/Behavioral: Positive for suicidal ideas.    Allergies  Review of patient's allergies indicates no known allergies.  Home Medications   Current Outpatient Rx  Name  Route  Sig  Dispense  Refill  . DIVALPROEX SODIUM ER 500 MG PO TB24   Oral   Take 1 tablet (500 mg total) by mouth daily. For mood control.   30 tablet   0   . QUETIAPINE FUMARATE 100 MG PO TABS     Take by mouth ONE at breakfast, 1/2 at 3 PM, and THREE at bedtime for mood control and insomnia   135 tablet   0   . SERTRALINE HCL 100 MG PO TABS   Oral   Take 2 tablets (200 mg total) by mouth daily. For depression.   60 tablet   0   . TRAZODONE HCL 100 MG PO TABS   Oral   Take 2 tablets (200 mg total) by mouth at bedtime. For insomnia.   60 tablet   0     BP 128/56  Pulse 53  Temp 98.2 F (36.8 C) (Oral)  Resp 16  SpO2 99%  Physical Exam  Nursing note and vitals reviewed. Constitutional: He is oriented to person, place, and time. He appears well-developed. No distress.  HENT:  Head: Normocephalic and atraumatic.  Eyes: Conjunctivae normal and EOM are normal.  Cardiovascular: Normal rate and regular rhythm.   Pulmonary/Chest: Effort normal. No stridor. No respiratory distress.  Abdominal: He exhibits no distension.  Musculoskeletal: He exhibits no edema.  Neurological: He is alert and oriented to person, place, and time.  Skin: Skin is warm and dry.  Psychiatric: He has a normal mood and affect.    ED Course  Procedures (including critical care time) DIAGNOSTIC STUDIES: Oxygen Saturation is 99% on room air,  normal by my interpretation.    COORDINATION OF CARE: 19:37- Discussed having pt admitted at [facility] and pt agreed.    Labs Reviewed  ACETAMINOPHEN LEVEL  CBC  COMPREHENSIVE METABOLIC PANEL  ETHANOL  SALICYLATE LEVEL  URINE RAPID DRUG SCREEN (HOSP PERFORMED)   No results found.   No diagnosis found.    MDM  I personally performed the services described in this documentation, which was scribed in my presence. The recorded information has been reviewed and considered.  This patient presents with suicidal intent.  He has a history of multiple prior suicide attempts, but given his endorsement of suicidal ideation, his history of bipolar disorder, I discussed his care with the behavioral health team who will assist in his evaluation and  disposition.  Gerhard Munch, MD 08/25/12 2005

## 2012-08-25 NOTE — ED Notes (Addendum)
Pt c/o SI starting today; pt denies obvious plan; pt sts hx of same; pt cooperative at present; pt sts not taking meds

## 2012-08-26 MED ORDER — ALUM & MAG HYDROXIDE-SIMETH 200-200-20 MG/5ML PO SUSP: 30.0000 mL | ORAL | Status: DC | PRN

## 2012-08-26 MED ORDER — MAGNESIUM HYDROXIDE 400 MG/5ML PO SUSP: 30.0000 mL | Freq: Every day | ORAL | Status: DC | PRN

## 2012-08-26 MED ORDER — ACETAMINOPHEN 325 MG PO TABS
650.0000 mg | ORAL_TABLET | Freq: Four times a day (QID) | ORAL | Status: DC | PRN
  Administered 2012-08-29 – 2012-09-02 (×3): 650 mg via ORAL

## 2012-08-26 NOTE — ED Notes (Addendum)
Patient currently asleep in bed; no respiratory or acute distress noted.  Sitter present at bedside; denies needs at this time.  Breakfast tray has been ordered for patient.  Will continue to monitor.

## 2012-08-26 NOTE — ED Notes (Signed)
Patient reports that he came in for suicidal ideation; reports attempting to hang himself with a bathroom curtain yesterday (08/24/12).  Patient states that he is here for help; denies use of alcohol.  Reports that he uses cocaine occasionally.  Upon arrival to Pod C, patient in blue paper scrubs.  Belongings have be inventoried by previous shift.  Sitter present at bedside.  Patient denies any needs at this time.

## 2012-08-26 NOTE — ED Notes (Signed)
Patient currently asleep in bed; no respiratory or acute distress noted.  Sitter present at bedside; will continue to monitor. 

## 2012-08-26 NOTE — ED Notes (Signed)
Patient sleeping quietly in bed; no respiratory or acute distress noted.  Respirations equal and unlabored; 16 breaths per minute.  Sitter present at bedside; denies any needs a this time.  Will continue to monitor.
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
## 2012-08-26 NOTE — ED Notes (Signed)
Report called to Jan, RN at [facility]

## 2012-08-26 NOTE — ED Notes (Signed)
Patient currently asleep in bed; no respiratory or acute distress noted.  Sitter present at bedside.  Will continue to monitor. 

## 2012-08-26 NOTE — ED Notes (Signed)
Patient currently asleep in bed; no respiratory or acute distress noted.  Sitter present at bedside; sitter given break for lunch (nurse tech say for sitter).  Will continue to monitor.

## 2012-08-27 ENCOUNTER — Encounter (HOSPITAL_COMMUNITY): Payer: Self-pay | Admitting: *Deleted

## 2012-08-27 ENCOUNTER — Inpatient Hospital Stay (HOSPITAL_COMMUNITY)
Admission: EM | Admit: 2012-08-27 | Discharge: 2012-09-05 | DRG: 885 | Disposition: A | Discharge status: Home / Self Care | Payer: Medicaid Other | Source: Ambulatory Visit | Attending: Psychiatry | Admitting: Psychiatry

## 2012-08-27 DIAGNOSIS — F17200 Nicotine dependence, unspecified, uncomplicated: Secondary | ICD-10-CM | POA: Diagnosis present

## 2012-08-27 DIAGNOSIS — F209 Schizophrenia, unspecified: Secondary | ICD-10-CM | POA: Diagnosis present

## 2012-08-27 DIAGNOSIS — F319 Bipolar disorder, unspecified: Secondary | ICD-10-CM

## 2012-08-27 DIAGNOSIS — F3130 Bipolar disorder, current episode depressed, mild or moderate severity, unspecified: Principal | ICD-10-CM | POA: Diagnosis present

## 2012-08-27 DIAGNOSIS — F3160 Bipolar disorder, current episode mixed, unspecified: Secondary | ICD-10-CM

## 2012-08-27 DIAGNOSIS — R45851 Suicidal ideations: Secondary | ICD-10-CM

## 2012-08-27 DIAGNOSIS — F602 Antisocial personality disorder: Secondary | ICD-10-CM | POA: Diagnosis present

## 2012-08-27 DIAGNOSIS — J45909 Unspecified asthma, uncomplicated: Secondary | ICD-10-CM | POA: Diagnosis present

## 2012-08-27 DIAGNOSIS — F419 Anxiety disorder, unspecified: Secondary | ICD-10-CM

## 2012-08-27 DIAGNOSIS — Z23 Encounter for immunization: Secondary | ICD-10-CM

## 2012-08-27 DIAGNOSIS — F1210 Cannabis abuse, uncomplicated: Secondary | ICD-10-CM

## 2012-08-27 MED ORDER — PNEUMOCOCCAL VAC POLYVALENT 25 MCG/0.5ML IJ INJ
0.5000 mL | INJECTION | Freq: Once | INTRAMUSCULAR | Status: AC
  Administered 2012-08-27: 0.5 mL via INTRAMUSCULAR

## 2012-08-27 MED ORDER — INFLUENZA VIRUS VACC SPLIT PF IM SUSP
0.5000 mL | Freq: Once | INTRAMUSCULAR | Status: AC
  Administered 2012-08-27: 0.5 mL via INTRAMUSCULAR

## 2012-08-27 MED ORDER — HYDROXYZINE HCL 25 MG PO TABS: 25.0000 mg | ORAL_TABLET | Freq: Four times a day (QID) | ORAL | Status: DC | PRN

## 2012-08-27 MED ORDER — TRAZODONE HCL 50 MG PO TABS
50.0000 mg | ORAL_TABLET | Freq: Every evening | ORAL | Status: DC | PRN
  Administered 2012-08-27: 50 mg via ORAL
  Filled 2012-08-27 (×6): qty 1

## 2012-08-27 MED ORDER — TRAZODONE HCL 100 MG PO TABS
100.0000 mg | ORAL_TABLET | Freq: Every evening | ORAL | Status: DC | PRN
  Administered 2012-08-27 – 2012-09-04 (×16): 100 mg via ORAL
  Filled 2012-08-27: qty 1
  Filled 2012-08-27: qty 6
  Filled 2012-08-27 (×18): qty 1
  Filled 2012-08-27: qty 6
  Filled 2012-08-27: qty 1

## 2012-08-27 MED ORDER — LITHIUM CARBONATE 150 MG PO CAPS
150.0000 mg | ORAL_CAPSULE | Freq: Two times a day (BID) | ORAL | Status: DC
  Administered 2012-08-27 – 2012-08-29 (×4): 150 mg via ORAL
  Filled 2012-08-27 (×6): qty 1
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
## 2012-08-27 NOTE — Progress Notes (Signed)
[facility] Group Notes:  (Counselor/Nursing/MHT/Case Management/Adjunct)  08/27/2012 4:19 PM  Type of Therapy:  Psychoeducational Skills  Participation Level:  Active  Participation Quality:  Appropriate, Attentive, Intrusive, Monopolizing, Redirectable and Sharing  Affect:  Appropriate and Excited  Cognitive:  Alert, Appropriate and Oriented  Insight:  Limited  Engagement in Group:  Good  Engagement in Therapy:  n/a  Modes of Intervention:  Activity, Education, Limit-setting, Problem-solving, Socialization and Support  Summary of Progress/Problems: [name] attended a Psychoeducational group that focused on using quality time with support systems/individuals to engage in healthy coping skills. [name] participated in an activity guessing about self and peers. [name] was active while group discussed who their support systems are, how they can spend positive quality time with them as a coping skills and a way to strengthen their relationship. [name] was given a homework assignment to find two ways to improve his support systems and twenty activities he can do to spend quality time with his supports.   Wandra Scot 08/27/2012, 4:19 PM

## 2012-08-27 NOTE — Progress Notes (Signed)
Grief and Loss Group  Several Members participated in sharing ways in which they were experiencing loss and how they were experiencing grief. They talked about the importance of support and how difficult it is when those closest to them are not supportive. Several talked about the support they feel from each other and having the ability to come into the hospital when life becomes too difficult.    Pt was attentive throughout group.  [name]

## 2012-08-27 NOTE — Tx Team (Signed)
Initial Interdisciplinary Treatment Plan  PATIENT STRENGTHS: (choose at least two) Ability for insight Average or above average intelligence Capable of independent living Communication skills General fund of knowledge Physical Health  PATIENT STRESSORS: Financial difficulties Medication change or noncompliance Substance abuse   PROBLEM LIST: Problem List/Patient Goals Date to be addressed Date deferred Reason deferred Estimated date of resolution  "To get in a rehab facility" 08/26/12           Increased risk for suicide 08/26/12     Depression 08/26/12                                    DISCHARGE CRITERIA:  Ability to meet basic life and health needs Adequate post-discharge living arrangements Improved stabilization in mood, thinking, and/or behavior Motivation to continue treatment in a less acute level of care Need for constant or close observation no longer present Reduction of life-threatening or endangering symptoms to within safe limits Verbal commitment to aftercare and medication compliance Withdrawal symptoms are absent or subacute and managed without 24-hour nursing intervention  PRELIMINARY DISCHARGE PLAN: Attend PHP/IOP Attend 12-step recovery group Outpatient therapy Participate in family therapy  PATIENT/FAMIILY INVOLVEMENT: This treatment plan has been presented to and reviewed with the patient, [name], and/or family member.  The patient and family have been given the opportunity to ask questions and make suggestions.  [name] 08/27/2012, 1:33 AM
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
## 2012-08-27 NOTE — Progress Notes (Signed)
Psychoeducational Group Note  Date:  08/27/2012 Time:  2000  Group Topic/Focus:  Wrap-Up Group:   The focus of this group is to help patients review their daily goal of treatment and discuss progress on daily workbooks.  Participation Level:  Minimal  Participation Quality:  Resistant  Affect:  Excited  Cognitive:  Alert  Insight:  None  Engagement in Group:  Limited  Additional Comments: Patient shared that he was given a patient resource book but that he threw it away.  Laurelin Elson, Newton Pigg 08/27/2012, 9:57 PM
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
## 2012-08-27 NOTE — Tx Team (Signed)
Interdisciplinary Treatment Plan Update (Adult)  Date:  08/27/2012  Time Reviewed:  10:00 AM   Progress in Treatment: Attending groups:   Yes   Participating in groups:  Yes Taking medication as prescribed:  Yes Tolerating medication:  Yes Family/Significant othe contact made: Contact made with family Patient understands diagnosis:  Yes Discussing patient identified problems/goals with staff: Yes Medical problems stabilized or resolved: Yes Denies suicidal/homicidal ideation: No -  Patient able to contract for safety Issues/concerns per patient self-inventory:  Other:  New problem(s) identified:  Reason for Continuation of Hospitalization: Anxiety Depression Medication stabilization Suicidal ideation  Interventions implemented related to continuation of hospitalization: Medication Management; safety checks q 15 mins; coping skills development  Additional comments:  Estimated length of stay: 3-5 days  Discharge Plan:  Outpatient follow up  New goal(s):  Review of initial/current patient goals per problem list:    1.  Goal(s): Eliminate SI/other thoughts of self harm   Met:  No  Target date: d/c  As evidenced by: Patient will no longer endorse SI/HI or other thoughts of self harm.    2.  Goal (s):Reduce depression/anxiety  Met: Yes  Target date: d/c  As evidenced by: Patient will rate symptoms at four or below    3.  Goal(s):.stabilize on meds   Met:  No  Target date: d/c  As evidenced by: Patient will report being stabilized on medications - less symptomatic    4.  Goal(s): Refer for outpatient follow up   Met:  No  Target date: d/c  As evidenced by: Follow up appointment will be scheduled    Attendees: Patient:  [name] 08/27/2012 10:00 AM  Physican:  Patrick North, MD 08/27/2012 10:00 AM  Nursing:  Burnetta Sabin, RN 08/27/2012 10:00 AM   Nursing:    08/27/2012 10:00 AM   Clinical Social Worker:  Juline Patch, LCSW 08/27/2012  10:00 AM   Other: Norval Gable, FNP 08/27/2012 10:00 AM   Other: Teddy Spike, MSW Intern     08/27/2012 08/27/2012 Other:        08/27/2012 10:00 AM
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
## 2012-08-28 NOTE — H&P (Signed)
Patient seen and evaluated. Agree with above assessment and recommendations. Will continue to monitor patient`s homicidal thoughts and will alert police as appropriate.

## 2012-08-28 NOTE — Progress Notes (Signed)
[facility] Adult Inpatient Family/Significant Other Suicide Prevention Education  Suicide Prevention Education:  Education Completed; Monica Becton - Sister-[phone], has been identified by the patient as the family member/significant other with whom the patient will be residing, and identified as the person(s) who will aid the patient in the event of a mental health crisis (suicidal ideations/suicide attempt).  With written consent from the patient, the family member/significant other has been provided the following suicide prevention education, prior to the and/or following the discharge of the patient.  The suicide prevention education provided includes the following:  Suicide risk factors  Suicide prevention and interventions  National Suicide Hotline telephone number  [facility] assessment telephone number  [facility] Emergency Assistance 911  [facility] and/or Residential Mobile Crisis Unit telephone number  Request made of family/significant other to:  Remove weapons (e.g., guns, rifles, knives), all items previously/currently identified as safety concern.  Sister advised patient will not be living with her and she does not know of him having access to guns.  Remove drugs/medications (over-the-counter, prescriptions, illicit drugs), all items previously/currently identified as a safety concern.  The family member/significant other verbalizes understanding of the suicide prevention education information provided.  The family member/significant other agrees to remove the items of safety concern listed above.  Wynn Banker 08/28/2012, 11:31 AM

## 2012-08-28 NOTE — Progress Notes (Signed)
Psychoeducational Group Note  Date:  08/28/2012 Time:  1100  Group Topic/Focus:  Building Self Esteem:   The Focus of this group is helping patients become aware of the effects of self-esteem on their lives, the things they and others do that enhance or undermine their self-esteem, seeing the relationship between their level of self-esteem and the choices they make and learning ways to enhance self-esteem.  Participation Level:  Active  Participation Quality:  Attentive, Redirectable and Sharing  Affect:  Appropriate  Cognitive:  Appropriate  Insight:  Good  Engagement in Group:  Good  Additional Comments:   Patient participated and shared in group. Patient shared the definition of self-esteem in own terms and gave information on positive and negative self esteem in ways that affect patient internally, physically, emotional and externally. After talking about the influences of self-esteem, patient was given a handout on self-esteem and a worksheet that patient during the group.   Karleen Hampshire Brittini 08/28/2012, 6:41 PM
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
## 2012-08-28 NOTE — Progress Notes (Signed)
Psychoeducational Group Note  Date:  08/28/2012 Time:  2000  Group Topic/Focus:  Karaoke    Participation Level:  Active  Participation Quality:  Appropriate  Affect:  Appropriate and Excited  Cognitive:  Appropriate  Insight:  Good  Engagement in Group:  Good  Additional Comments:  Pt participated in group this evening.     Tamsyn Owusu A 08/28/2012, 11:17 PM

## 2012-08-28 NOTE — Progress Notes (Signed)
   [facility] Group Notes:  (Counselor/Nursing/MHT/Case Management/Adjunct)  08/28/2012 1:15 PM  Type of Therapy:  Group Therapy  [facility] Speaker: Dot Lanes  Participation Level:  Active  Participation Quality:  Appropriate and Drowsy  Affect:  Appropriate and Flat  Cognitive:  Alert, Appropriate and Oriented  Insight:  Limited  Engagement in Group:  Good  Engagement in Therapy:  Limited  Modes of Intervention:  Activity, Education and Support  Summary of Progress/Problems: [name] was attentive during group, shared in the beginning his name and age along with wanting to see his daughter as a way to help him.  He was engaged in group and listened to speaker, but at times yawned very loudly and had to be re-directed at times.  He thanked the speaker as she was walking out and was appreciative of her story.   Medha Pippen Nail, LCSW 08/28/2012 2:42 PM

## 2012-08-28 NOTE — Progress Notes (Signed)
Psychoeducational Group Note  Date:  08/28/2012 Time:  1000  Group Topic/Focus:  Goals Group:   The focus of this group is to help patients establish daily goals to achieve during treatment and discuss how the patient can incorporate goal setting into their daily lives to aide in recovery.  Participation Level:  Active  Participation Quality:  Appropriate, Attentive, Sharing and Supportive  Affect:  Anxious  Cognitive:  Alert, Appropriate and Oriented  Insight:  Good  Engagement in Group:  Good  Additional Comments:  Patient goal for today is to ",amage my depression and anxiety." Patient shared his favorite childhood memory was when his father taught him to play basketball.   Noah Charon 08/28/2012, 11:12 AM

## 2012-08-28 NOTE — Progress Notes (Signed)
D: Patient in day room playing cards with peers at the beginning of this shift. He reported having a good day. Denied SI; but endorsed HI towards someone named, "Charles". Also reported that his Lithium; "making my mood worse".  A: Writer encouraged patient to attend Karaoke. R: Patient said he was somewhat nevous but would try and attend. Q 15 minute check continues to maintain safety.

## 2012-08-29 MED ORDER — LITHIUM CARBONATE 300 MG PO CAPS
300.0000 mg | ORAL_CAPSULE | Freq: Two times a day (BID) | ORAL | Status: DC
  Administered 2012-08-29 – 2012-08-30 (×2): 300 mg via ORAL
  Filled 2012-08-29 (×6): qty 1
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
## 2012-08-29 NOTE — Progress Notes (Signed)
[facility] Group Notes:  (Counselor/Nursing/MHT/Case Management/Adjunct)  08/29/2012 10:44 PM  Type of Therapy:  Psychoeducational Skills  Participation Level:  Active  Participation Quality:  Appropriate  Affect:  Appropriate  Cognitive:  Appropriate  Insight:  Limited  Engagement in Group:  Good  Engagement in Therapy:  Good  Modes of Intervention:  Education  Summary of Progress/Problems: The patient described his day as being "so so". He verbalizes that he continues to have homicidal thoughts. He has coped with those thoughts by playing cards and socializing with his peers, but the thoughts persist. His goal for tomorrow is to work on his homicidal thoughts.    [name] 08/29/2012, 10:44 PM

## 2012-08-29 NOTE — Progress Notes (Signed)
Psychoeducational Group Note  Date:  08/29/2012 Time:  10:00AM  Group Topic/Focus:  Relapse Prevention Planning:   The focus of this group is to define relapse and discuss the need for planning to combat relapse.  Participation Level:  Active  Participation Quality:  Appropriate and Attentive  Affect:  Appropriate  Cognitive:  Appropriate  Insight:  Good  Engagement in Group:  Good  Additional Comments:  Staff explained to the patient that the purpose of the relapse prevention planning group is to provide them with information and guidance in creating a personalized plan to aid them in prevention of reverting to unhealthy behaviors. Staff informed patient that a relapse prevention plan refers to any type of maladaptive behavior, not just substance abuse. Staff explained to the patient that Relapse prevention plans incorporate triggers, early warning signs, and coping skills into an action plan that the patient can have in hand at any sign of relapse. Staff educated patients on what each of these components are and how they relate to relapse. Staff concluded the group by encouraging patients to utilize the coping skills and mechanisms taught to them to assist in relapse prevention.  Ardelle Park O 08/29/2012, 11:22 AM
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
## 2012-08-30 DIAGNOSIS — F3130 Bipolar disorder, current episode depressed, mild or moderate severity, unspecified: Principal | ICD-10-CM

## 2012-08-30 MED ORDER — CHLORPROMAZINE HCL 25 MG PO TABS
25.0000 mg | ORAL_TABLET | Freq: Three times a day (TID) | ORAL | Status: DC
  Administered 2012-08-30 – 2012-08-31 (×3): 25 mg via ORAL
  Filled 2012-08-30 (×6): qty 1

## 2012-08-30 MED ORDER — CHLORPROMAZINE HCL 25 MG PO TABS
25.0000 mg | ORAL_TABLET | Freq: Three times a day (TID) | ORAL | Status: DC | PRN
  Administered 2012-08-30 – 2012-09-01 (×3): 25 mg via ORAL
  Filled 2012-08-30 (×3): qty 1

## 2012-08-30 MED ORDER — CARBAMAZEPINE 200 MG PO TABS
200.0000 mg | ORAL_TABLET | Freq: Three times a day (TID) | ORAL | Status: DC
  Administered 2012-08-30 – 2012-08-31 (×3): 200 mg via ORAL
  Filled 2012-08-30 (×6): qty 1
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
## 2012-08-30 NOTE — Progress Notes (Signed)
[name] MD Progress Note  08/30/2012 1:04 PM [name]  MRN:  [id number]  S: Patient states the Lithium last night kept his suicidal thoughts more promnient and his aggression worse.  He gained a lot of weight on Depakote.  Will try Tegretol for bipolar and anger control.  Diagnosis:   Axis I: Bipolar, Depressed Axis II: No diagnosis Axis III:  Past Medical History  Diagnosis Date  . Asthma   . Bipolar depression   . Schizophrenia    Axis IV: occupational problems, other psychosocial or environmental problems, problems related to legal system/crime and problems related to social environment Axis V: 41-50 serious symptoms  ADL's:  Intact  Sleep: Poor for the past few nights  Appetite:  Fair    Mental Status Examination/Evaluation: Objective:  Appearance: Casual  Eye Contact::  Fair  Speech:  Clear and Coherent  Volume:  Normal  Mood:  Anxious  Affect:  Blunt  Thought Process:  Circumstantial  Orientation:  Full  Thought Content:  WDL, Paranoid Ideation and Rumination  Suicidal Thoughts:  No  Homicidal Thoughts:  Yes.  with intent/plan  Memory:  Immediate;   Fair Recent;   Fair Remote;   Fair  Judgement:  Impaired  Insight:  Lacking  Psychomotor Activity:  Normal  Concentration:  Fair  Recall:  Fair  Akathisia:  No  Handed:  Right  AIMS (if indicated):     Assets:  Communication Skills  Sleep:  Number of Hours: 5.75    Vital Signs:Blood pressure 126/72, pulse 65, temperature 97.6 F (36.4 C), temperature source Oral, resp. rate 16, height 5\' 8"  (1.727 m), weight 79.198 kg (174 lb 9.6 oz). Current Medications: Current Facility-Administered Medications  Medication Dose Route Frequency Provider Last Rate Last Dose  . acetaminophen (TYLENOL) tablet 650 mg  650 mg Oral Q6H PRN [name] Gable, NP   650 mg at 08/29/12 2055  . alum & mag hydroxide-simeth (MAALOX/MYLANTA) 200-200-20 MG/5ML suspension 30 mL  30 mL Oral Q4H PRN [name] Gable, NP      .  carbamazepine (TEGRETOL) tablet 200 mg  200 mg Oral TID [name] Craze, MD      . hydrOXYzine (ATARAX/VISTARIL) tablet 25 mg  25 mg Oral Q6H PRN [name] Hough, PA      . magnesium hydroxide (MILK OF MAGNESIA) suspension 30 mL  30 mL Oral Daily PRN [name] Gable, NP      . traZODone (DESYREL) tablet 100 mg  100 mg Oral QHS,MR X 1 [name] Gable, NP   100 mg at 08/29/12 2307  . [DISCONTINUED] lithium carbonate capsule 300 mg  300 mg Oral BID [name] Ravi, MD   300 mg at 08/30/12 0800    Lab Results: No results found for this or any previous visit (from the past 48 hour(s)).  Physical Findings: AIMS: Facial and Oral Movements Muscles of Facial Expression: None, normal Lips and Perioral Area: None, normal Jaw: None, normal Tongue: None, normal,Extremity Movements Upper (arms, wrists, hands, fingers): None, normal Lower (legs, knees, ankles, toes): None, normal, Trunk Movements Neck, shoulders, hips: None, normal, Overall Severity Severity of abnormal movements (highest score from questions above): None, normal Incapacitation due to abnormal movements: None, normal Patient's awareness of abnormal movements (rate only patient's report): No Awareness, Dental Status Current problems with teeth and/or dentures?: No Does patient usually wear dentures?: No  CIWA:  CIWA-Ar Total: 0  COWS:     Treatment Plan Summary: Daily contact with patient to assess and evaluate symptoms and progress  in treatment Medication management  Plan: Stop Lithium, start Tegretol for bipolar disorder and add Thorazine for anxiety management. Continue to assess for homicidal thoughts.  [name] 08/30/2012, 1:04 PM

## 2012-08-30 NOTE — Progress Notes (Addendum)
Goals Group Note  Date:  11/9//2013 Time: 0900 Group Topic/Focus:  Identifying Needs:   The focus of this group is to introduce the Daily Workbooks to the patients, help them identify something positive in their lives and help them identify and begin to work on what they want to change in their lives.  Participation Level:  active Participation Quality: good Affect:Cognitive:    Insight:  good  Engagement in Group: engaged  Additional Comments: Pt was engaged in group, asked appropriate questions, shared personal life experience with the group and demonstrates willingness to process and understand her problems. PDuke RN [facility]

## 2012-08-30 NOTE — Progress Notes (Signed)
Psychoeducational Group Note  Date:  08/30/2012 Time: 1015  Group Topic/Focus:  Identifying Needs:   The focus of this group is to help patients identify their personal needs that have been historically problematic and identify healthy behaviors to address their needs.  Participation Level:  None  Participation Quality:  Drowsy  Affect:  Labile  Cognitive:  Alert  Insight:  Limited  Engagement in Group:  None  Additional Comments:    08/30/2012,1:01 PM Lizzett Nobile, Joie Bimler

## 2012-08-30 NOTE — Progress Notes (Signed)
Psychoeducational Group Note  Date:  08/30/2012 Time:  1515  Group Topic/Focus:  Coping With Mental Health Crisis:   The purpose of this group is to help patients identify strategies for coping with mental health crisis.  Group discusses possible causes of crisis and ways to manage them effectively.  Participation Level:  Active  Participation Quality:  Redirectable and Sharing  Affect:  Appropriate and Excited  Cognitive:  Appropriate  Insight:  Good  Engagement in Group:  Good  Additional Comments: pt was loud, staff had to redirect pt a few times. Pt was all over the place, pt was "figity" and wouldn't be still.  [name] M 08/30/2012, 7:37 PM

## 2012-08-31 MED ORDER — RISPERIDONE 0.5 MG PO TABS
0.5000 mg | ORAL_TABLET | Freq: Every day | ORAL | Status: DC
  Administered 2012-08-31 – 2012-09-01 (×2): 0.5 mg via ORAL
  Filled 2012-08-31 (×4): qty 1

## 2012-08-31 MED ORDER — RISPERIDONE 0.25 MG PO TABS
0.2500 mg | ORAL_TABLET | Freq: Two times a day (BID) | ORAL | Status: DC | PRN
  Administered 2012-09-01 – 2012-09-02 (×3): 0.25 mg via ORAL
  Filled 2012-08-31: qty 1

## 2012-08-31 MED ORDER — RISPERIDONE 0.25 MG PO TABS
0.2500 mg | ORAL_TABLET | Freq: Two times a day (BID) | ORAL | Status: DC
  Administered 2012-08-31 – 2012-09-05 (×10): 0.25 mg via ORAL
  Filled 2012-08-31 (×9): qty 1
  Filled 2012-08-31: qty 6
  Filled 2012-08-31: qty 1
  Filled 2012-08-31: qty 6
  Filled 2012-08-31 (×4): qty 1

## 2012-08-31 NOTE — Progress Notes (Signed)
Patient has been up and active on the unit, reported to writer that his medications were changed today because they were making him feel dizzy, light-headed and weird. Patient reported that his homicidal thoughts were stronger on these medications. Patient denies auditory hallucinations but still has si and hi thoughts. Patient attended and participated in group this evening. Support and encouragement offered, safety maintained on unit, will continue to monitor with 15 min checks.

## 2012-08-31 NOTE — Clinical Social Work Note (Signed)
[facility] Group Notes:  (Clinical Social Work)  08/31/2012   3:00-4:00PM  Summary of Progress/Problems:   The main focus of today's process group was for the patient to identify their current support system and decide on other supports that can be put in place to prevent future hospitalizations.  An emphasis was placed on using therapist, doctor and problem-specific support groups to expand supports. The patient expressed that he does not have supports except for his sister, and she becomes emotional about his problems.  The patient then went to sleep.  Type of Therapy:  Group Therapy  Participation Level:  Minimal  Participation Quality:  Drowsy and Sharing  Affect:  Blunted and Depressed  Cognitive:  Oriented  Insight:  Good  Engagement in Group:  Limited  Engagement in Therapy:  Limited  Modes of Intervention:  Clarification, Education, Limit-setting, Problem-solving, Socialization, Support and Processing   Ambrose Mantle, LCSW 08/31/2012, 4:52 PM

## 2012-08-31 NOTE — Progress Notes (Signed)
[facility] Group Notes:  (Counselor/Nursing/MHT/Case Management/Adjunct)  08/31/2012 12:16 AM  Type of Therapy:  Psychoeducational Skills  Participation Level:  Active  Participation Quality:  Redirectable  Affect:  Anxious  Cognitive:  Appropriate  Insight:  Good  Engagement in Group:  Limited  Engagement in Therapy:  Limited  Modes of Intervention:  Education  Summary of Progress/Problems: The patient described his day as being positive since he is presently taking new medication. He states that his homicidal thoughts have decreased and that they are passive. His goal for tomorrow is to have a better day and to work on his homicidal thoughts.    [name] S 08/31/2012, 12:16 AM

## 2012-08-31 NOTE — Progress Notes (Signed)
Psychoeducational Group Note  Date:  08/31/2012 Time:  2000  Group Topic/Focus:  Wrap-Up Group:   The focus of this group is to help patients review their daily goal of treatment and discuss progress on daily workbooks.  Participation Level:  Active  Participation Quality:  Appropriate  Affect:  Appropriate and Excited  Cognitive:  Appropriate  Insight:  Good  Engagement in Group:  Good  Additional Comments:    [name] A 08/31/2012, 10:27 PM

## 2012-08-31 NOTE — Progress Notes (Signed)
Psychoeducational Group Note  Date: 08/31/2012 Time: 1015  Group Topic/Focus:  Making Healthy Choices:   The focus of this group is to help patients identify negative/unhealthy choices they were using prior to admission and identify positive/healthier coping strategies to replace them upon discharge.  Participation Level:  Minimal  Participation Quality:  Intrusive  Affect:  Depressed  Cognitive:  Alert  Insight:  Good  Engagement in Group:  Good  Additional Comments:    08/31/2012,4:39 PM Mahira Petras, Joie Bimler

## 2012-08-31 NOTE — Progress Notes (Signed)
Psychoeducational Group Note  Date:  08/31/2012 Time: 0900  Group Topic/Focus:  The group focuses on helping the patients identify something they are thankful for , introducing the Sunday Patient Workbooks to them and helping   Participation Level:  active Participation Quality: good Affect: flat Cognitive:  fair  Insight:  good  Engagement in Group: engaged  Additional Comments:

## 2012-08-31 NOTE — Progress Notes (Signed)
Patient has been up in the dayroom playing cards with select peers. Patient attended group and reports that his medication has been changed and hopeful it will work. Patient reports that his homicidal thoughts are not as constant as they were on yesterday. Patient compliant with medications, support and encouragement offered, pt is safe with 15 min checks, will continue to monitor.

## 2012-08-31 NOTE — Progress Notes (Signed)
[facility] MD Progress Note  08/31/2012 10:20 AM Nickolaus Copland  MRN:  [id number]  S: Patient states the Tegretol caused him to be dizzy. Nurse stopped that.  He noted good control of anxiety with the Thorazine,  Will switch to Risperdal low dose during the day and moderate doe at bed for thoughts and depression.   Diagnosis:   Axis I: Bipolar, Depressed Axis II: No diagnosis Axis III:  Past Medical History  Diagnosis Date  . Asthma   . Bipolar depression   . Schizophrenia    Axis IV: occupational problems, other psychosocial or environmental problems, problems related to legal system/crime and problems related to social environment Axis V: 41-50 serious symptoms  ADL's:  Intact  Sleep: Poor for the past few nights  Appetite:  Fair    Mental Status Examination/Evaluation: Objective:  Appearance: Casual  Eye Contact::  Fair  Speech:  Clear and Coherent  Volume:  Normal  Mood:  Anxious  Affect:  Blunt  Thought Process:  Circumstantial  Orientation:  Full  Thought Content:  WDL, Paranoid Ideation and Rumination  Suicidal Thoughts:  No  Homicidal Thoughts:  Yes.  with intent/plan  Memory:  Immediate;   Fair Recent;   Fair Remote;   Fair  Judgement:  Impaired  Insight:  Lacking  Psychomotor Activity:  Normal  Concentration:  Fair  Recall:  Fair  Akathisia:  No  Handed:  Right  AIMS (if indicated):     Assets:  Communication Skills  Sleep:  Number of Hours: 5.25    Vital Signs:Blood pressure 126/64, pulse 77, temperature 97.5 F (36.4 C), temperature source Oral, resp. rate 18, height 5\' 8"  (1.727 m), weight 79.198 kg (174 lb 9.6 oz). Current Medications: Current Facility-Administered Medications  Medication Dose Route Frequency Provider Last Rate Last Dose  . acetaminophen (TYLENOL) tablet 650 mg  650 mg Oral Q6H PRN Norval Gable, NP   650 mg at 08/30/12 1701  . alum & mag hydroxide-simeth (MAALOX/MYLANTA) 200-200-20 MG/5ML suspension 30 mL  30 mL Oral Q4H PRN  Norval Gable, NP      . chlorproMAZINE (THORAZINE) tablet 25 mg  25 mg Oral TID PRN Mike Craze, MD   25 mg at 08/30/12 1351  . hydrOXYzine (ATARAX/VISTARIL) tablet 25 mg  25 mg Oral Q6H PRN Kerry Hough, PA      . magnesium hydroxide (MILK OF MAGNESIA) suspension 30 mL  30 mL Oral Daily PRN Norval Gable, NP      . risperiDONE (RISPERDAL) tablet 0.25 mg  0.25 mg Oral BID Mike Craze, MD      . risperiDONE (RISPERDAL) tablet 0.25 mg  0.25 mg Oral BID PRN Mike Craze, MD      . risperiDONE (RISPERDAL) tablet 0.5 mg  0.5 mg Oral QHS Mike Craze, MD      . traZODone (DESYREL) tablet 100 mg  100 mg Oral QHS,MR X 1 Norval Gable, NP   100 mg at 08/30/12 2216  . [DISCONTINUED] carbamazepine (TEGRETOL) tablet 200 mg  200 mg Oral TID Mike Craze, MD   200 mg at 08/31/12 1478  . [DISCONTINUED] chlorproMAZINE (THORAZINE) tablet 25 mg  25 mg Oral TID Mike Craze, MD   25 mg at 08/31/12 2956  . [DISCONTINUED] lithium carbonate capsule 300 mg  300 mg Oral BID Himabindu Ravi, MD   300 mg at 08/30/12 0800    Lab Results: No results found for this or any previous visit (from the past 48  hour(s)).  Physical Findings: AIMS: Facial and Oral Movements Muscles of Facial Expression: None, normal Lips and Perioral Area: None, normal Jaw: None, normal Tongue: None, normal,Extremity Movements Upper (arms, wrists, hands, fingers): None, normal Lower (legs, knees, ankles, toes): None, normal, Trunk Movements Neck, shoulders, hips: None, normal, Overall Severity Severity of abnormal movements (highest score from questions above): None, normal Incapacitation due to abnormal movements: None, normal Patient's awareness of abnormal movements (rate only patient's report): No Awareness, Dental Status Current problems with teeth and/or dentures?: No Does patient usually wear dentures?: No  CIWA:  CIWA-Ar Total: 0  COWS:     Treatment Plan Summary: Daily contact with patient to assess and  evaluate symptoms and progress in treatment Medication management  Plan: Stop Tegretol for the dizziness noted with that for bipolar disorder Shift from Thorazine and go to Risperdal for management of bipolar disorder and anxiety. Continue to assess for homicidal thoughts.  Zosia Lucchese 08/31/2012, 10:20 AM

## 2012-08-31 NOTE — Progress Notes (Signed)
[name] remains labile, needing much staff attention frequently in order to maintain control..he is  Seen laughing and joking often with other patients in the dayroom.HE can be intrusive, often getting loud but he responds appropriately when this nurse reminds him of his boundaries.  A. He completed his AM self inventory and on it he wrote he cont to have SI, but he immediately contracted with this nurse to stay safe today. HE rated his depression and hopelessness "10/10"  And wrote his DC plan includes " stay away from people that will make me relapse".  R Safety is in plavce and POC cont.

## 2012-09-01 NOTE — Progress Notes (Signed)
Patient ID: [name], male   DOB: [birth year], 21 y.o.   MRN: [id number] D-Patient reports he slept well and that his appetite is good.  He reports his energy level is low and that his depression is a 10.  He says that risperdal is not strong enough and that he needs a larger dose to help with his agitation. A peer reported that patient was making inappropriate comments to male patients.  A- talked with patient about this and he states he respects females but says he needs something "for my anger".  Gave patient prn thorazine which he found helpful.  Patient was in dayroom later demonstrating how he could do the split. He reports he pulled a L groin muscle doing this.  Gave patient an ice pack and he says leg is now fine. Agreed not to try to do a split again.

## 2012-09-01 NOTE — Progress Notes (Signed)
Psychoeducational Group Note  Date:  09/01/2012 Time:  1100  Group Topic/Focus:  Wellness Toolbox:   The focus of this group is to discuss various aspects of wellness, balancing those aspects and exploring ways to increase the ability to experience wellness.  Patients will create a wellness toolbox for use upon discharge.  Participation Level:  Did Not Attend   Meredith Staggers 09/01/2012, 6:22 PM
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
## 2012-09-01 NOTE — Progress Notes (Signed)
[facility] Group Notes:  (Counselor/Nursing/MHT/Case Management/Adjunct)  09/01/2012 2:48 PM  Type of Therapy:  Group Therapy  Overcoming Obstacles: Processing Group  Participation Level:  Active  Participation Quality:  Intrusive, Inattentive and Redirectable  Affect:  Angry, Blunted and Irritable  Cognitive:  Alert and Oriented  Insight:  Limited  Engagement in Group:  Limited  Engagement in Therapy:  Limited  Modes of Intervention:  Limit-setting and Problem-solving  Summary of Progress/Problems:  Johsua did participate in group today, but reported he was having a very bad day and things were not going well.  Adarius had to be re-directed several times as he would get off topic about his legal problems and disobeying his [employer] fast streamer and leaving town. Yecheskel was encouraged to come up with other alternatives, but his mood was very irritable as if he wanted to pick a fight and remain defensive. Quadry was able to report when talking about overcoming obstacles that a trigger for him was rejection, especially from a male. Maricela reported he wants to overall decrease his SI/HI thoughts which are currently active and work towards appropriate relationships and overcoming his current legal problems .     Nail, Catalina Gravel 09/01/2012, 2:48 PM

## 2012-09-01 NOTE — Progress Notes (Signed)
[facility] MD Progress Note  09/01/2012 2:02 PM [name]  MRN:  [id number]  S: Patient reports feeling anxious. Continues to have suicidal and homicidal ideations towards ex-girl friend`s boy friend. He was taken off the Lithium over the weekend, he reports having increased si/hi on the Lithium.  Diagnosis:   Axis I: Bipolar, Depressed, Cannabis abuse Axis II: No diagnosis Axis III:  Past Medical History  Diagnosis Date  . Asthma   . Bipolar depression   . Schizophrenia    Axis IV: economic problems, housing problems and occupational problems Axis V: 41-50 serious symptoms  ADL's:  Intact  Sleep: Fair  Appetite:  Fair  Mental Status Examination/Evaluation: Objective:  Appearance: Casual  Eye Contact::  Good  Speech:  Clear and Coherent  Volume:  Normal  Mood:  Anxious and Dysphoric  Affect:  Blunt  Thought Process:  Coherent  Orientation:  Full  Thought Content:  Obsessions, Paranoid Ideation and Rumination  Suicidal Thoughts:  Yes.  without intent/plan  Homicidal Thoughts:  Yes.  with intent/plan  Memory:  Immediate;   Fair Recent;   Fair Remote;   Fair  Judgement:  Fair  Insight:  Fair  Psychomotor Activity:  Normal  Concentration:  Fair  Recall:  Fair  Akathisia: no  Handed:  Right  AIMS (if indicated):     Assets:  Communication Skills Desire for Improvement  Sleep:  Number of Hours: 5.75    Vital Signs:Blood pressure 128/77, pulse 70, temperature 97.6 F (36.4 C), temperature source Oral, resp. rate 18, height 5\' 8"  (1.727 m), weight 79.198 kg (174 lb 9.6 oz). Current Medications: Current Facility-Administered Medications  Medication Dose Route Frequency Provider Last Rate Last Dose  . acetaminophen (TYLENOL) tablet 650 mg  650 mg Oral Q6H PRN Norval Gable, NP   650 mg at 08/30/12 1701  . alum & mag hydroxide-simeth (MAALOX/MYLANTA) 200-200-20 MG/5ML suspension 30 mL  30 mL Oral Q4H PRN Norval Gable, NP      . chlorproMAZINE (THORAZINE) tablet 25  mg  25 mg Oral TID PRN Mike Craze, MD   25 mg at 09/01/12 1304  . hydrOXYzine (ATARAX/VISTARIL) tablet 25 mg  25 mg Oral Q6H PRN Kerry Hough, PA      . magnesium hydroxide (MILK OF MAGNESIA) suspension 30 mL  30 mL Oral Daily PRN Norval Gable, NP      . risperiDONE (RISPERDAL) tablet 0.25 mg  0.25 mg Oral BID Mike Craze, MD   0.25 mg at 09/01/12 9147  . risperiDONE (RISPERDAL) tablet 0.25 mg  0.25 mg Oral BID PRN Mike Craze, MD   0.25 mg at 09/01/12 1400  . risperiDONE (RISPERDAL) tablet 0.5 mg  0.5 mg Oral QHS Mike Craze, MD   0.5 mg at 08/31/12 2110  . traZODone (DESYREL) tablet 100 mg  100 mg Oral QHS,MR X 1 Norval Gable, NP   100 mg at 08/31/12 2242    Lab Results: No results found for this or any previous visit (from the past 48 hour(s)).  Physical Findings: AIMS: Facial and Oral Movements Muscles of Facial Expression: None, normal Lips and Perioral Area: None, normal Jaw: None, normal Tongue: None, normal,Extremity Movements Upper (arms, wrists, hands, fingers): None, normal Lower (legs, knees, ankles, toes): None, normal, Trunk Movements Neck, shoulders, hips: None, normal, Overall Severity Severity of abnormal movements (highest score from questions above): None, normal Incapacitation due to abnormal movements: None, normal Patient's awareness of abnormal movements (rate only patient's report): No Awareness,  Dental Status Current problems with teeth and/or dentures?: No Does patient usually wear dentures?: No  CIWA:  CIWA-Ar Total: 0  COWS:     Treatment Plan Summary: Daily contact with patient to assess and evaluate symptoms and progress in treatment Medication management  Plan:  Increase risperdal to 0.25mg  po bid. Continue to monitor for patient`s SI/HI.  [name] 09/01/2012, 2:02 PM

## 2012-09-02 NOTE — Progress Notes (Signed)
Psychoeducational Group Note  Date:  09/02/2012 Time: 2000 Group Topic/Focus:  Wrap-Up Group:   The focus of this group is to help patients review their daily goal of treatment and discuss progress on daily workbooks.  Participation Level:  Active  Participation Quality:  Appropriate  Affect:  Appropriate  Cognitive:  Appropriate  Insight:  Good  Engagement in Group:  Good  Additional CommentsCasilda Carls 09/02/2012, 9:27 PM

## 2012-09-02 NOTE — Progress Notes (Signed)
D: Pt has been pleasant & cooperative so far this shift.Pt. States that he is feeling better now ,& denies SI & HI @ this time. Pt went off the unit to the gym with the 4 PM group.Pt rates his depression & hopelessness as a 10.A: Supported & encouraged. Continues on 15 minute checks. R: Pt safety maintained.

## 2012-09-02 NOTE — Progress Notes (Signed)
  Aftercare Planning Group: 09/02/2012 8:45 AM  Kong did not attending discharge planning group this morning.  Will follow up and encourage him to attend group this afternoon.   [facility] Group Notes:  (Counselor/Nursing/MHT/Case Management/Adjunct)  09/02/2012 1:15 PM  Type of Therapy:  Group Therapy  Participation Level:  Active  Participation Quality:  Attentive and Redirectable  Affect:  Appropriate  Cognitive:  Appropriate  Insight:  Limited  Engagement in Group:  Good  Engagement in Therapy:  Limited  Modes of Intervention:  Clarification, Education, Problem-solving and Support  Summary of Progress/Problems:  Kadarrius was very active in group.  He shared he has been dealing with some type of diagnosis since age 21.  He stated he wishes it were possible to be born again without all the problems.  Patient was encouraged to focus on accepting himself and what he can do to make life better.  Patient shared he can take medications to stay calm and keep his appointments.   Hannah Nail, LCSW 09/02/2012 10:40 AM

## 2012-09-02 NOTE — Progress Notes (Signed)
[facility] MD Progress Note  09/02/2012 4:27 PM Lenzy Ennen  MRN:  [id number]  Diagnosis:   Axis I: Bipolar, Depressed, Cannabis abuse  Axis II: No diagnosis  Axis III:  Past Medical History   Diagnosis  Date   .  Asthma    .  Bipolar depression    .  Schizophrenia     Axis IV: economic problems, housing problems and occupational problems  Axis V: 41-50 serious symptoms  Subject: "My mood is better but still up and down." "Depression, Anxiety, and Hopelessness are all 9/10"  ADL's:  Impaired  Sleep: Fair   Appetite:  Fair "Not very hungry".  Suicidal Ideation:  Plan:  Intermittent suicidal ideation. Contracts for safety. Intent:  denies Means:  none  Homicidal Ideation:  Plan:  denies Intent:  denies Means:  none  AEB (as evidenced by):  Mental Status Examination/Evaluation: Objective:  Appearance: Fairly Groomed  Eye Contact::  Minimal  Speech:  Slow  Volume:  Decreased  Mood:  Anxious and Depressed  Affect:  Constricted and Depressed  Thought Process:  Linear  Orientation:  Full  Thought Content:  Hallucinations: Auditory  Suicidal Thoughts:  Yes.  without intent/plan  Homicidal Thoughts:  No  Memory:  Immediate;   Fair Recent;   Fair Remote;   Fair  Judgement:  Impaired  Insight:  Lacking  Psychomotor Activity:  Normal  Concentration:  Fair  Recall:  Fair  Akathisia:  No  Handed:  Right  AIMS (if indicated):     Assets:  Desire for Improvement  Sleep:  Number of Hours: 6.75    Vital Signs:Blood pressure 123/90, pulse 60, temperature 97.4 F (36.3 C), temperature source Oral, resp. rate 18, height 5\' 8"  (1.727 m), weight 79.198 kg (174 lb 9.6 oz). Current Medications: Current Facility-Administered Medications  Medication Dose Route Frequency Provider Last Rate Last Dose  . acetaminophen (TYLENOL) tablet 650 mg  650 mg Oral Q6H PRN Norval Gable, NP   650 mg at 09/02/12 1357  . alum & mag hydroxide-simeth (MAALOX/MYLANTA) 200-200-20 MG/5ML  suspension 30 mL  30 mL Oral Q4H PRN Norval Gable, NP      . hydrOXYzine (ATARAX/VISTARIL) tablet 25 mg  25 mg Oral Q6H PRN Kerry Hough, PA      . magnesium hydroxide (MILK OF MAGNESIA) suspension 30 mL  30 mL Oral Daily PRN Norval Gable, NP      . risperiDONE (RISPERDAL) tablet 0.25 mg  0.25 mg Oral BID Mike Craze, MD   0.25 mg at 09/02/12 0818  . traZODone (DESYREL) tablet 100 mg  100 mg Oral QHS,MR X 1 Norval Gable, NP   100 mg at 09/01/12 2252  . [DISCONTINUED] chlorproMAZINE (THORAZINE) tablet 25 mg  25 mg Oral TID PRN Mike Craze, MD   25 mg at 09/01/12 2141  . [DISCONTINUED] risperiDONE (RISPERDAL) tablet 0.25 mg  0.25 mg Oral BID PRN Mike Craze, MD   0.25 mg at 09/02/12 1358  . [DISCONTINUED] risperiDONE (RISPERDAL) tablet 0.5 mg  0.5 mg Oral QHS Mike Craze, MD   0.5 mg at 09/01/12 2142    Lab Results: No results found for this or any previous visit (from the past 48 hour(s)).  Physical Findings: AIMS: Facial and Oral Movements Muscles of Facial Expression: None, normal Lips and Perioral Area: None, normal Jaw: None, normal Tongue: None, normal,Extremity Movements Upper (arms, wrists, hands, fingers): None, normal Lower (legs, knees, ankles, toes): None, normal, Trunk Movements Neck, shoulders, hips: None,  normal, Overall Severity Severity of abnormal movements (highest score from questions above): None, normal Incapacitation due to abnormal movements: None, normal Patient's awareness of abnormal movements (rate only patient's report): No Awareness, Dental Status Current problems with teeth and/or dentures?: No Does patient usually wear dentures?: No  CIWA:  CIWA-Ar Total: 0  COWS:     Treatment Plan Summary: Daily contact with patient to assess and evaluate symptoms and progress in treatment Medication management  Plan: Discontinued all sources of risperdal except 0.25mg  bid daily. Will continue to evaluate patient on current medication  regiment without any changes.  Norval Gable FNP-BC 09/02/2012, 4:27 PM
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
## 2012-09-02 NOTE — Progress Notes (Signed)
Psychoeducational Group Note  Date:  09/02/2012 Time:  1100  Group Topic/Focus:  Recovery Goals:   The focus of this group is to identify appropriate goals for recovery and establish a plan to achieve them.  Participation Level: Did Not Attend  Participation Quality:  Not Applicable  Affect:  Not Applicable  Cognitive:  Not Applicable  Insight:  Not Applicable  Engagement in Group: Not Applicable  Additional Comments:  Patient did not attend group.  Karleen Hampshire Brittini 09/02/2012, 1:36 PM

## 2012-09-03 MED ORDER — BENZTROPINE MESYLATE 0.5 MG PO TABS
0.5000 mg | ORAL_TABLET | Freq: Two times a day (BID) | ORAL | Status: DC
  Administered 2012-09-03 – 2012-09-05 (×4): 0.5 mg via ORAL
  Filled 2012-09-03 (×6): qty 1
  Filled 2012-09-03 (×2): qty 6

## 2012-09-03 NOTE — Progress Notes (Signed)
D: Patient resting in bed with eyes closed.  Respirations even and unlabored.  Patient appears to be in no apparent distress. A: Staff to monitor Q 15 mins for safety.   R:Patient remains safe on the unit.  
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
## 2012-09-03 NOTE — Progress Notes (Signed)
[facility] Group Notes:  (Counselor/Nursing/MHT/Case Management/Adjunct)  09/03/2012 4:48 PM  Type of Therapy:  Psychoeducational Skills  Participation Level:  Did Not Attend  Summary of Progress/Problems:  Trueman did not attend a psychoeducational group on labels.     Wandra Scot 09/03/2012, 4:48 PM

## 2012-09-03 NOTE — Progress Notes (Signed)
[facility] MD Progress Note  09/03/2012 11:36 AM [name]  MRN:  [id number]  S: Patient reports some stiffness in his muscles, has some rigidity in his arms. States he continues to have homicidal thoughts but does not have suicidal thoughts.  Diagnosis:   Axis I: Bipolar, mixed Axis II: No diagnosis Axis III:  Past Medical History  Diagnosis Date  . Asthma   . Bipolar depression   . Schizophrenia    Axis IV: housing problems and occupational problems Axis V: 41-50 serious symptoms  ADL's:  Intact  Sleep: Fair  Appetite:  Fair   Mental Status Examination/Evaluation: Objective:  Appearance: Casual  Eye Contact::  Good  Speech:  Clear and Coherent  Volume:  Normal  Mood:  Anxious  Affect:  Appropriate  Thought Process:  Coherent  Orientation:  Full  Thought Content:  Obsessions and Paranoid Ideation  Suicidal Thoughts:  No  Homicidal Thoughts:  Yes.  with intent/plan  Memory:  Immediate;   Fair Recent;   Fair Remote;   Fair  Judgement:  Impaired  Insight:  Lacking  Psychomotor Activity:  Normal  Concentration:  Fair  Recall:  Fair  Akathisia:  Yes  Handed:  Right  AIMS (if indicated):     Assets:  Communication Skills Physical Health  Sleep:  Number of Hours: 6.25    Vital Signs:Blood pressure 139/70, pulse 80, temperature 98.6 F (37 C), temperature source Oral, resp. rate 16, height 5\' 8"  (1.727 m), weight 79.198 kg (174 lb 9.6 oz). Current Medications: Current Facility-Administered Medications  Medication Dose Route Frequency Provider Last Rate Last Dose  . acetaminophen (TYLENOL) tablet 650 mg  650 mg Oral Q6H PRN Norval Gable, NP   650 mg at 09/02/12 1357  . alum & mag hydroxide-simeth (MAALOX/MYLANTA) 200-200-20 MG/5ML suspension 30 mL  30 mL Oral Q4H PRN Norval Gable, NP      . hydrOXYzine (ATARAX/VISTARIL) tablet 25 mg  25 mg Oral Q6H PRN Kerry Hough, PA      . magnesium hydroxide (MILK OF MAGNESIA) suspension 30 mL  30 mL Oral Daily PRN  Norval Gable, NP      . risperiDONE (RISPERDAL) tablet 0.25 mg  0.25 mg Oral BID Mike Craze, MD   0.25 mg at 09/03/12 0813  . traZODone (DESYREL) tablet 100 mg  100 mg Oral QHS,MR X 1 Norval Gable, NP   100 mg at 09/02/12 2146  . [DISCONTINUED] chlorproMAZINE (THORAZINE) tablet 25 mg  25 mg Oral TID PRN Mike Craze, MD   25 mg at 09/01/12 2141  . [DISCONTINUED] risperiDONE (RISPERDAL) tablet 0.25 mg  0.25 mg Oral BID PRN Mike Craze, MD   0.25 mg at 09/02/12 1358  . [DISCONTINUED] risperiDONE (RISPERDAL) tablet 0.5 mg  0.5 mg Oral QHS Mike Craze, MD   0.5 mg at 09/01/12 2142    Lab Results: No results found for this or any previous visit (from the past 48 hour(s)).  Physical Findings: AIMS: Facial and Oral Movements Muscles of Facial Expression: None, normal Lips and Perioral Area: None, normal Jaw: None, normal Tongue: None, normal,Extremity Movements Upper (arms, wrists, hands, fingers): None, normal Lower (legs, knees, ankles, toes): None, normal, Trunk Movements Neck, shoulders, hips: None, normal, Overall Severity Severity of abnormal movements (highest score from questions above): None, normal Incapacitation due to abnormal movements: None, normal Patient's awareness of abnormal movements (rate only patient's report): No Awareness, Dental Status Current problems with teeth and/or dentures?: No Does patient usually wear  dentures?: No  CIWA:  CIWA-Ar Total: 0  COWS:     Treatment Plan Summary: Daily contact with patient to assess and evaluate symptoms and progress in treatment Medication management  Plan: Start cogentin 1mg  po bid to help with rigidity. Continue current plan of care.  [name] 09/03/2012, 11:36 AM

## 2012-09-03 NOTE — Progress Notes (Signed)
  Aftercare Planning Group: 09/03/2012 8:45 AM  Pt attended discharge planning group and actively participated in group.  SW provided pt with today's workbook.  Pt presents with continued suicidal and homicidal thoughts towards others.  [name] reports things are bad right now and he rates depression at a 8 and anxiety at a 9.   [name] was asked what is causing his SI and HI thoughts, but he is able to explain, reporting he does not know.  [name] plan at discharge is for him to follow up at [facility] for his medications and counseling. He reports he also wants to work for a guy in a moving company and plans to interview.  [name] was attentive during group and also intrusive at times. His mood is very labile.  He reports poor sleep, but engages with peers daily.  Still working to decrease SI and HI and develop a safe discharge plan.  [facility] Group Notes:  (Counselor/Nursing/MHT/Case Management/Adjunct)   09/03/2012 1:15 PM  Type of Therapy:  Group Therapy  Participation Level:  Active  Participation Quality:  Intrusive and Redirectable  Affect:  Appropriate  Cognitive:  Appropriate  Insight:  Limited  Engagement in Group:  Limited  Engagement in Therapy:  Limited  Modes of Intervention:  Education, Problem-solving, Support and Exploration  Summary of Progress/Problems: [name] shared he has difficulty coping with anxiety. He was redirected several times for inappropriate comments.   [name] Nail, LCSW 09/03/2012 10:09 AM

## 2012-09-03 NOTE — Progress Notes (Signed)
Psychoeducational Group Note  Date:  09/03/2012 Time:  1100  Group Topic/Focus:  Personal Choices and Values:   The focus of this group is to help patients assess and explore the importance of values in their lives, how their values affect their decisions, how they express their values and what opposes their expression.  Participation Level: Did Not Attend  Participation Quality:  Not Applicable  Affect:  Not Applicable  Cognitive:  Not Applicable  Insight:  Not Applicable  Engagement in Group: Not Applicable  Additional Comments:  Patient did not attend group.  Karleen Hampshire Brittini 09/03/2012, 7:01 PM

## 2012-09-03 NOTE — Tx Team (Signed)
Interdisciplinary Treatment Plan Update (Adult)  Date:  09/03/2012  Time Reviewed:  9:37 AM   Progress in Treatment: Attending groups:   Yes   Participating in groups:  Yes Taking medication as prescribed:  Yes Tolerating medication:  Yes Family/Significant othe contact made: Contact made with family Patient understands diagnosis:  Yes Discussing patient identified problems/goals with staff: Yes Medical problems stabilized or resolved: Yes Denies suicidal/homicidal ideation:Yes Issues/concerns per patient self-inventory:  Other:  New problem(s) identified:  Reason for Continuation of Hospitalization: Anxiety Depression Medication stabilization Suicidal ideation  Interventions implemented related to continuation of hospitalization:  Medication Management; safety checks q 15 mins  Additional comments:  Estimated length of stay:  3-5 days  Discharge Plan:  Home with outpatient follow up  New goal(s):  Review of initial/current patient goals per problem list:    1.  Goal(s): Eliminate SI/other thoughts of self harm   Met:  No  Target date: d/c  As evidenced by: Patient will no longer endorse SI/HI or other thoughts of self harm.    2.  Goal (s):Reduce depression/anxiety (rated at nine)  Met: Yes  Target date: d/c  As evidenced by: Patient currently rating symptoms at four or below    3.  Goal(s):.stabilize on meds   Met:  No  Target date: d/c  As evidenced by: Patient will report being stabilized on medications - less symptomatic    4.  Goal(s): Refer for outpatient follow up   Met:  No  Target date: d/c  As evidenced by: Follow up appointment scheduled    Attendees: Patient:   09/03/2012 9:37 AM  Physican:  Patrick North, MD 09/03/2012 9:37 AM  Nursing:  Leighton Parody, RN 09/03/2012 9:37 AM   Nursing:   Harold Barban, RN 09/03/2012 9:37 AM   Clinical Social Worker:  Juline Patch, LCSW 09/03/2012 9:37 AM   Other: Norval Gable,  FNP 09/03/2012 9:37 AM   Other: Teddy Spike, MSW Intern     09/03/2012 09/03/2012 Other:        09/03/2012 9:37 AM

## 2012-09-03 NOTE — Progress Notes (Signed)
[facility] Group Notes:  (Counselor/Nursing/MHT/Case Management/Adjunct)  09/03/2012 8:10PM  Type of Therapy:  Group Therapy  Participation Level:  Active  Participation Quality:  Sharing and Supportive  Affect:  Appropriate and Excited  Cognitive:  Alert and Oriented  Insight:  Good  Engagement in Group:  Good  Engagement in Therapy:  Good  Modes of Intervention:  Clarification, Problem-solving, Socialization and Support  Summary of Progress/Problems: Pt reported that he had a great day. Pt stated that he doesn't have any more SI/HI thoughts at this time. Pt stated that a coping skill that he can use is to think and talk to someone. Pt stated that his main support is his sister. One other coping strategy that he can use is playing basketball.  Rosiland Sen, Randal Buba 09/03/2012, 9:18 PM
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
## 2012-09-04 LAB — CARBAMAZEPINE, FREE AND TOTAL
Carbamazepine Metabolite -: 0.3 ug/mL (ref 0.2–2.0)
Carbamazepine Metabolite/: 0.2 ug/mL
Carbamazepine, Bound: 2.8 ug/mL
Carbamazepine, Total: 3.5 ug/mL — ABNORMAL LOW (ref 4.0–12.0)

## 2012-09-04 NOTE — Progress Notes (Signed)
Reviewed. Treatment plan discussed prior to initiating changes.
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
## 2012-09-04 NOTE — Progress Notes (Signed)
D: Patient in hallway on approach.  Patient states he feels good.  Patient states he is happy because he is going home tomorrow.  Patient still child-like and attention seeking.  Patient had to be redirected in the hallway for making inappropriate comments to staff.  Patient will talk under his breath and then when asked to repeat he will not.  Patient also asked several times to leave a male patient alone for violating personal space.  Patient denies depression and anxiety.  Patient denies SI/HI and denies AVH. A: Staff to monitor Q 15 mins for safety.  Encouragement and support offered.  Scheduled medications administered per orders. R: Patient remains safe on the unit.  Patient attended karaoke group. Patient taking administered medications but still need redirection at time.  Patient is redirectable.

## 2012-09-04 NOTE — Progress Notes (Signed)
Aftercare Planning Group:  09/04/2012 8:30 AM  Patient rates depression, anxiety, hopelessness, and helplessness at zero. He is not endorsing SI or HI at this time.  Patient states that he is doing great.  He disclosed that the person that he was having HI toward has been arrested which has greatly reduced is symptoms.  Patient states that he has found a friend to stay with following discharge.   [facility] Group Notes:  (Counselor/Nursing/MHT/Case Management/Adjunct) Living a Balanced Life 1:15-2:15   09/04/2012 10:32 AM  Type of Therapy:  Group Therapy  Participation Level: Minimal  Participation Quality:  Attentive  Affect:  Appropriate and Excited  Cognitive:  Appropriate  Insight:  Minimal  Engagement in Group:  Minimal  Engagement in Therapy:  Minimal  Modes of Intervention:  Education, Problem-solving and Support  Summary of Progress/Problems: Patient listened attentively to speaker from the [employer].  He shared that one of his strengths is playing basketball.    Bournes, Roshelle L 09/04/2012, 10:32 AM

## 2012-09-04 NOTE — Progress Notes (Signed)
Psychoeducational Group Note  Date:  09/04/2012 Time:  1100  Group Topic/Focus:  Rediscovering Joy:   The focus of this group is to explore various ways to relieve stress in a positive manner.  Participation Level: Did Not Attend  Participation Quality:  Not Applicable  Affect:  Not Applicable  Cognitive:  Not Applicable  Insight:  Not Applicable  Engagement in Group: Not Applicable  Additional Comments:  Patient did not attend group, pt remained in group.  Karleen Hampshire Brittini 09/04/2012, 4:29 PM

## 2012-09-04 NOTE — Progress Notes (Signed)
Psychoeducational Group Note  Date:  09/04/2012 Time:  10:00 AM  Group Topic/Focus:  Therapeutic Activity - Question Newman Pies  Participation Level:  Did Not Attend  Participation Quality:  Did not attend  Affect:    Cognitive:  Insight:  Did not attend  Engagement in Group:  Did not attend  Additional Comments:  Did not attend  Melanee Spry 09/04/2012, 6:55 PM
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
## 2012-09-05 DIAGNOSIS — F1210 Cannabis abuse, uncomplicated: Secondary | ICD-10-CM

## 2012-09-05 MED ORDER — BENZTROPINE MESYLATE 0.5 MG PO TABS: 0.5000 mg | ORAL_TABLET | Freq: Two times a day (BID) | ORAL | Status: DC

## 2012-09-05 MED ORDER — RISPERIDONE 0.25 MG PO TABS: 0.2500 mg | ORAL_TABLET | Freq: Two times a day (BID) | ORAL | Status: DC

## 2012-09-05 MED ORDER — TRAZODONE HCL 100 MG PO TABS: 100.0000 mg | ORAL_TABLET | Freq: Every evening | ORAL | Status: DC | PRN

## 2012-09-05 NOTE — Progress Notes (Signed)
[facility] Case Management Discharge Plan:  Will you be returning to the same living situation after discharge: No. Patient will be going to live with a friend. At discharge, do you have transportation home?:Yes,  Patient provided with a bus pass. Do you have the ability to pay for your medications:No. Patient assisted with indigent medications.  Interagency Information:     Release of information consent forms completed and in the chart;  Patient's signature needed at discharge.  Patient to Follow up at:  Follow-up Information    Follow up with [facility]. On 09/08/2012. (Please go to [facility] on Monday, September 08, 2012 or any weekday between 8AM-3PM for medication management.  Please advise receptions that you would also like to have couunseling.)    Contact information:   [address]  [place], [place]  [address]   [phone]         Patient denies SI/HI:   Yes,     patient denies SI/HI or other thoughts of self-harm.   Safety Planning and Suicide Prevention discussed:  Yes,  suicide prevention discussed during D/C planning group   Barrier to discharge identified:Yes,  Lack of stable housing, lack of income, legal problems.  Summary and Recommendations: Patient reccommended to follow instructions of physician and to attend all follow up appointments.   Brydan Downard L 09/05/2012, 11:46 AM
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
## 2012-09-05 NOTE — Progress Notes (Signed)
Patient attended 11 am Psychoeducational group.  Group consists of therapeutic question ball along with long and short term goals 
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
## 2012-09-05 NOTE — Progress Notes (Signed)
Nrsg DC  D 1420 MD completed DC order and DC AVS in pt's computer chart. Pt denied experiencing SI, HI, and /  Or presence of audit, vis tactile halluc. Pt given his AVS and he states he  Understands and can and will comply.   A Pt is UAL without diff. HE denies SI within the past 24 hrs, he denies audit, vis, tactile halluc. He states he has learned " not to let people  get him angry"....while here at [facility].     R Safety is in place and pt is escorted to bldg entrance, all belongings returned to him ( Previously locked up) AND PT IS dc'D.

## 2012-09-05 NOTE — Progress Notes (Signed)
D: Pt in bed resting with eyes closed. Respirations even and unlabored. Pt appears to be in no signs of distress at this time. A: Q15min checks remains for this pt. R: Pt remains safe at this time.   

## 2012-09-08 ENCOUNTER — Encounter (HOSPITAL_COMMUNITY): Payer: Self-pay | Admitting: Adult Health

## 2012-09-08 ENCOUNTER — Emergency Department (HOSPITAL_COMMUNITY)
Admission: EM | Admit: 2012-09-08 | Discharge: 2012-09-09 | Disposition: A | Discharge status: Other Acute Inpatient Hospital | Payer: Medicaid Other | Attending: Emergency Medicine | Admitting: Emergency Medicine

## 2012-09-08 DIAGNOSIS — F329 Major depressive disorder, single episode, unspecified: Secondary | ICD-10-CM | POA: Insufficient documentation

## 2012-09-08 DIAGNOSIS — J45909 Unspecified asthma, uncomplicated: Secondary | ICD-10-CM | POA: Insufficient documentation

## 2012-09-08 DIAGNOSIS — Z79899 Other long term (current) drug therapy: Secondary | ICD-10-CM | POA: Insufficient documentation

## 2012-09-08 DIAGNOSIS — F209 Schizophrenia, unspecified: Secondary | ICD-10-CM | POA: Insufficient documentation

## 2012-09-08 DIAGNOSIS — F411 Generalized anxiety disorder: Secondary | ICD-10-CM | POA: Insufficient documentation

## 2012-09-08 DIAGNOSIS — F17200 Nicotine dependence, unspecified, uncomplicated: Secondary | ICD-10-CM | POA: Insufficient documentation

## 2012-09-08 LAB — CBC
MCV: 87.2 fL (ref 78.0–100.0)
Platelets: 195 10*3/uL (ref 150–400)
RDW: 12.8 % (ref 11.5–15.5)
WBC: 9.2 10*3/uL (ref 4.0–10.5)

## 2012-09-08 LAB — RAPID URINE DRUG SCREEN, HOSP PERFORMED
Barbiturates: NOT DETECTED
Benzodiazepines: NOT DETECTED
Cocaine: NOT DETECTED

## 2012-09-08 LAB — COMPREHENSIVE METABOLIC PANEL
AST: 19 U/L (ref 0–37)
Albumin: 4.1 g/dL (ref 3.5–5.2)
Chloride: 102 mEq/L (ref 96–112)
Creatinine, Ser: 0.71 mg/dL (ref 0.50–1.35)
Sodium: 141 mEq/L (ref 135–145)
Total Bilirubin: 0.2 mg/dL — ABNORMAL LOW (ref 0.3–1.2)

## 2012-09-08 LAB — SALICYLATE LEVEL: Salicylate Lvl: <2 mg/dL — ABNORMAL LOW (ref 2.8–20.0)

## 2012-09-08 LAB — ACETAMINOPHEN LEVEL: Acetaminophen (Tylenol), Serum: <15 ug/mL (ref 10–30)

## 2012-09-08 NOTE — ED Provider Notes (Signed)
History     CSN: [id number]  Arrival date & time 09/08/12  2049   First MD Initiated Contact with Patient 09/08/12 2254      Chief Complaint  Patient presents with  . Suicidal    (Consider location/radiation/quality/duration/timing/severity/associated sxs/prior treatment) HPI Comments: This is a 21 year old male, who presents emergency department with chief complaint of SI, anxiety, and depression for the past 2 days. He was recently released by behavioral health the same. Patient states that he has planned to slit his throat when cut his wrists. Patient is not in any apparent medical distress. He denies headache, chest pain, shortness of breath, nausea, vomiting, diarrhea, constipation, peripheral edema, numbness or tingling of the extremities. He's not in any pain. He has not tried anything to alleviate his symptoms.  The history is provided by the patient. No language interpreter was used.    Past Medical History  Diagnosis Date  . Asthma   . Bipolar depression   . Schizophrenia     Past Surgical History  Procedure Date  . No past surgeries     History reviewed. No pertinent family history.  History  Substance Use Topics  . Smoking status: Current Every Day Smoker -- 1.0 packs/day for 13 years    Types: Cigarettes  . Smokeless tobacco: Not on file  . Alcohol Use: No      Review of Systems  All other systems reviewed and are negative.    Allergies  Review of patient's allergies indicates no known allergies.  Home Medications   Current Outpatient Rx  Name  Route  Sig  Dispense  Refill  . BENZTROPINE MESYLATE 0.5 MG PO TABS   Oral   Take 0.5 mg by mouth 2 (two) times daily.         Marland Kitchen DIAZEPAM 10 MG PO TABS   Oral   Take 10 mg by mouth 2 (two) times daily.         Marland Kitchen RISPERIDONE 0.25 MG PO TABS   Oral   Take 0.25 mg by mouth 2 (two) times daily.         . TRAZODONE HCL 100 MG PO TABS   Oral   Take 100-200 mg by mouth at bedtime. Take 100 mg  at bedtime, but may repeat dose if it is ineffective.           BP 138/63  Pulse 75  Temp 98.7 F (37.1 C) (Oral)  Resp 18  SpO2 100%  Physical Exam  Nursing note and vitals reviewed. Constitutional: He is oriented to person, place, and time. He appears well-developed and well-nourished.  HENT:  Head: Normocephalic and atraumatic.  Mouth/Throat: Oropharynx is clear and moist.  Eyes: Conjunctivae normal and EOM are normal. Pupils are equal, round, and reactive to light.  Neck: Normal range of motion. Neck supple.  Cardiovascular: Normal rate, regular rhythm and normal heart sounds.   Pulmonary/Chest: Effort normal and breath sounds normal. No respiratory distress. He has no wheezes. He has no rales. He exhibits no tenderness.  Abdominal: Soft. Bowel sounds are normal. He exhibits no distension and no mass. There is no tenderness. There is no rebound and no guarding.  Musculoskeletal: Normal range of motion.  Neurological: He is alert and oriented to person, place, and time.  Skin: Skin is warm and dry.  Psychiatric: He has a normal mood and affect. His behavior is normal. Judgment and thought content normal.    ED Course  Procedures (including critical care time)  Labs Reviewed  COMPREHENSIVE METABOLIC PANEL - Abnormal; Notable for the following:    Potassium 3.2 (*)     Glucose, Bld 104 (*)     Total Bilirubin 0.2 (*)     All other components within normal limits  SALICYLATE LEVEL - Abnormal; Notable for the following:    Salicylate Lvl <2.0 (*)     All other components within normal limits  URINE RAPID DRUG SCREEN (HOSP PERFORMED) - Abnormal; Notable for the following:    Tetrahydrocannabinol POSITIVE (*)     All other components within normal limits  CBC  ETHANOL  ACETAMINOPHEN LEVEL   Results for orders placed during the hospital encounter of 09/08/12  CBC      Component Value Range   WBC 9.2  4.0 - 10.5 K/uL   RBC 4.91  4.22 - 5.81 MIL/uL   Hemoglobin 14.2   13.0 - 17.0 g/dL   HCT 69.6  29.5 - 28.4 %   MCV 87.2  78.0 - 100.0 fL   MCH 28.9  26.0 - 34.0 pg   MCHC 33.2  30.0 - 36.0 g/dL   RDW 13.2  44.0 - 10.2 %   Platelets 195  150 - 400 K/uL  COMPREHENSIVE METABOLIC PANEL      Component Value Range   Sodium 141  135 - 145 mEq/L   Potassium 3.2 (*) 3.5 - 5.1 mEq/L   Chloride 102  96 - 112 mEq/L   CO2 29  19 - 32 mEq/L   Glucose, Bld 104 (*) 70 - 99 mg/dL   BUN 12  6 - 23 mg/dL   Creatinine, Ser 7.25  0.50 - 1.35 mg/dL   Calcium 9.5  8.4 - 36.6 mg/dL   Total Protein 7.1  6.0 - 8.3 g/dL   Albumin 4.1  3.5 - 5.2 g/dL   AST 19  0 - 37 U/L   ALT 17  0 - 53 U/L   Alkaline Phosphatase 87  39 - 117 U/L   Total Bilirubin 0.2 (*) 0.3 - 1.2 mg/dL   GFR calc non Af Amer >90  >90 mL/min   GFR calc Af Amer >90  >90 mL/min  ETHANOL      Component Value Range   Alcohol, Ethyl (B) <11  0 - 11 mg/dL  ACETAMINOPHEN LEVEL      Component Value Range   Acetaminophen (Tylenol), Serum <15.0  10 - 30 ug/mL  SALICYLATE LEVEL      Component Value Range   Salicylate Lvl <2.0 (*) 2.8 - 20.0 mg/dL  URINE RAPID DRUG SCREEN (HOSP PERFORMED)      Component Value Range   Opiates NONE DETECTED  NONE DETECTED   Cocaine NONE DETECTED  NONE DETECTED   Benzodiazepines NONE DETECTED  NONE DETECTED   Amphetamines NONE DETECTED  NONE DETECTED   Tetrahydrocannabinol POSITIVE (*) NONE DETECTED   Barbiturates NONE DETECTED  NONE DETECTED       No diagnosis found.    MDM  21 year old male with SI.  I have discussed this patient with the ACT team, who will see him.  The patient is medically clear.  Labs have been ordered.        Roxy Horseman, PA-C 09/08/12 2327

## 2012-09-08 NOTE — ED Notes (Signed)
Presents with SI, anxiety and depression for 2 days. Was released from [facility] Friday for same. Pt states he has a plan to slit his throat and cut his wrists.

## 2012-09-09 ENCOUNTER — Inpatient Hospital Stay (HOSPITAL_COMMUNITY)
Admission: EM | Admit: 2012-09-09 | Discharge: 2012-09-10 | DRG: 885 | Disposition: A | Discharge status: Home / Self Care | Payer: Medicaid Other | Source: Ambulatory Visit | Attending: Psychiatry | Admitting: Psychiatry

## 2012-09-09 ENCOUNTER — Encounter (HOSPITAL_COMMUNITY): Payer: Self-pay | Admitting: Emergency Medicine

## 2012-09-09 ENCOUNTER — Encounter (HOSPITAL_COMMUNITY): Payer: Self-pay | Admitting: *Deleted

## 2012-09-09 DIAGNOSIS — Z79899 Other long term (current) drug therapy: Secondary | ICD-10-CM

## 2012-09-09 DIAGNOSIS — J45909 Unspecified asthma, uncomplicated: Secondary | ICD-10-CM | POA: Diagnosis present

## 2012-09-09 DIAGNOSIS — F3130 Bipolar disorder, current episode depressed, mild or moderate severity, unspecified: Secondary | ICD-10-CM

## 2012-09-09 DIAGNOSIS — F1210 Cannabis abuse, uncomplicated: Secondary | ICD-10-CM | POA: Diagnosis present

## 2012-09-09 DIAGNOSIS — R45851 Suicidal ideations: Secondary | ICD-10-CM

## 2012-09-09 DIAGNOSIS — F319 Bipolar disorder, unspecified: Principal | ICD-10-CM | POA: Diagnosis present

## 2012-09-09 MED ORDER — ACETAMINOPHEN 325 MG PO TABS: 650.0000 mg | ORAL_TABLET | Freq: Four times a day (QID) | ORAL | Status: DC | PRN

## 2012-09-09 MED ORDER — TRAZODONE HCL 50 MG PO TABS: 50.0000 mg | ORAL_TABLET | Freq: Every evening | ORAL | Status: DC | PRN

## 2012-09-09 MED ORDER — TRAZODONE HCL 100 MG PO TABS
100.0000 mg | ORAL_TABLET | Freq: Every evening | ORAL | Status: DC | PRN
  Administered 2012-09-09: 100 mg via ORAL
  Filled 2012-09-09: qty 1
  Filled 2012-09-09: qty 28
  Filled 2012-09-09 (×3): qty 1
  Filled 2012-09-09: qty 28

## 2012-09-09 MED ORDER — NICOTINE 21 MG/24HR TD PT24: 21.0000 mg | MEDICATED_PATCH | Freq: Every day | TRANSDERMAL | Status: DC

## 2012-09-09 MED ORDER — RISPERIDONE 0.25 MG PO TABS
0.2500 mg | ORAL_TABLET | Freq: Two times a day (BID) | ORAL | Status: DC
  Administered 2012-09-09 – 2012-09-10 (×3): 0.25 mg via ORAL
  Filled 2012-09-09 (×3): qty 1
  Filled 2012-09-09: qty 28
  Filled 2012-09-09 (×2): qty 1
  Filled 2012-09-09: qty 28
  Filled 2012-09-09: qty 1

## 2012-09-09 MED ORDER — MAGNESIUM HYDROXIDE 400 MG/5ML PO SUSP: 30.0000 mL | Freq: Every day | ORAL | Status: DC | PRN

## 2012-09-09 MED ORDER — HYDROXYZINE HCL 25 MG PO TABS: 25.0000 mg | ORAL_TABLET | Freq: Four times a day (QID) | ORAL | Status: DC | PRN

## 2012-09-09 MED ORDER — NICOTINE 21 MG/24HR TD PT24
21.0000 mg | MEDICATED_PATCH | Freq: Every day | TRANSDERMAL | Status: DC
  Administered 2012-09-10: 21 mg via TRANSDERMAL
  Filled 2012-09-09 (×4): qty 1

## 2012-09-09 MED ORDER — BENZTROPINE MESYLATE 0.5 MG PO TABS
0.5000 mg | ORAL_TABLET | Freq: Two times a day (BID) | ORAL | Status: DC
  Administered 2012-09-09 – 2012-09-10 (×3): 0.5 mg via ORAL
  Filled 2012-09-09: qty 28
  Filled 2012-09-09 (×6): qty 1
  Filled 2012-09-09: qty 28

## 2012-09-09 MED ORDER — ALUM & MAG HYDROXIDE-SIMETH 200-200-20 MG/5ML PO SUSP: 30.0000 mL | ORAL | Status: DC | PRN

## 2012-09-09 NOTE — ED Notes (Signed)
Report given to Harlan Stains, RN at [facility]. Security called to transport pt. Pt made aware of transport and room number at [facility].

## 2012-09-09 NOTE — ED Notes (Signed)
ACT Team Counselor at the bedside informing pt he has a bed at [facility]

## 2012-09-09 NOTE — ED Provider Notes (Signed)
  Physical Exam  BP 139/82  Pulse 57  Temp 97.9 F (36.6 C) (Oral)  Resp 18  SpO2 100%  Physical Exam Pateint accepted at [facility] DR. [facility] ED Course  Procedures  MDM       Arman Filter, NP 09/09/12 [phone]
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
## 2012-09-09 NOTE — BHH Counselor (Addendum)
Per Britta Mccreedy RN at [facility], pt has been accepted by Donell Sievert PA-C [facility]. RN and NP Earley Favor notified. Signed support paperwork faxed and originals placed in pt's chart.

## 2012-09-09 NOTE — Progress Notes (Signed)
D:  Patient's self inventory sheet, patient sleeps well, has good appetite, normal energy level, good attention span.   Rated depression and hopelessness #10.  Denied withdrawals.  Denied SI.  After discharge, plans to stay at home and stay away from people.  Denied SI and HI.  Denied A/V hallucinations.   Denied pain.  "I'm here for violence."    No discharge plans.  No problems taking meds after discharge. A:  Medications given per MD order.  Support and encouragement given throughout day.  Support and safety checks completed as ordered. R:  Following treatment plan.  Denied SI and HI.  Denied A/V hallucinations.  Contracts for safety.  Patient remains safe and receptive on unit.

## 2012-09-09 NOTE — ED Provider Notes (Signed)
Medical screening examination/treatment/procedure(s) were performed by non-physician practitioner and as supervising physician I was immediately available for consultation/collaboration.   Naveh Rickles, MD 09/09/12 0046 

## 2012-09-09 NOTE — ED Provider Notes (Signed)
Medical screening examination/treatment/procedure(s) were performed by non-physician practitioner and as supervising physician I was immediately available for consultation/collaboration.   David H Yao, MD 09/09/12 0610 
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
## 2012-09-09 NOTE — Clinical Social Work Note (Signed)
BHH Group Notes:  (Counselor/Nursing/MHT/Case Management/Adjunct)        Feelings about Diagnosis     09/09/2012   1:15   Type of Therapy: Group  Participation Level:  Did not attend  Participation Quality:    Affect:   Cognitive:    Insight:    Engagement in Group:   Engagement in Therapy:   Modes of Intervention:    Summary of Progress/Problems:  Patient did not attend group.   Wynn Banker 09/09/2012 4:19 PM

## 2012-09-09 NOTE — Tx Team (Signed)
Initial Interdisciplinary Treatment Plan  PATIENT STRENGTHS: (choose at least two) Capable of independent living Physical Health  PATIENT STRESSORS: Financial difficulties Loss of relationship with mother of baby and baby* Medication change or noncompliance Substance abuse   PROBLEM LIST: Problem List/Patient Goals Date to be addressed Date deferred Reason deferred Estimated date of resolution  Ineffective individual coping      Opiate abuse      Loss of relationship      Unstable living arrangements      Lack of social support                               DISCHARGE CRITERIA:  Improved stabilization in mood, thinking, and/or behavior Motivation to continue treatment in a less acute level of care Reduction of life-threatening or endangering symptoms to within safe limits Verbal commitment to aftercare and medication compliance Withdrawal symptoms are absent or subacute and managed without 24-hour nursing intervention  PRELIMINARY DISCHARGE PLAN: Attend 12-step recovery group Outpatient therapy Return to previous living arrangement Return to previous work or school arrangements  PATIENT/FAMIILY INVOLVEMENT: This treatment plan has been presented to and reviewed with the patient, [name].  The patient and family have been given the opportunity to ask questions and make suggestions.  Dion Saucier M 09/09/2012, 4:58 AM
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
## 2012-09-09 NOTE — Progress Notes (Signed)
Psychoeducational Group Note  Date:  09/09/2012 Time: 1100  Group Topic/Focus:  Recovery Goals:   The focus of this group is to identify appropriate goals for recovery and establish a plan to achieve them.  Participation Level: Did Not Attend  Participation Quality:  Not Applicable  Affect:  Not Applicable  Cognitive:  Not Applicable  Insight:  Not Applicable  Engagement in Group: Not Applicable  Additional Comments:  Patient did not attend group.  Karleen Hampshire Brittini 09/09/2012, 2:50 PM

## 2012-09-10 DIAGNOSIS — F1210 Cannabis abuse, uncomplicated: Secondary | ICD-10-CM

## 2012-09-10 DIAGNOSIS — F319 Bipolar disorder, unspecified: Principal | ICD-10-CM

## 2012-09-10 MED ORDER — TRAZODONE HCL 100 MG PO TABS: 100.0000 mg | ORAL_TABLET | Freq: Every evening | ORAL | Status: DC | PRN

## 2012-09-10 MED ORDER — RISPERIDONE 0.25 MG PO TABS: 0.2500 mg | ORAL_TABLET | Freq: Two times a day (BID) | ORAL | Status: DC

## 2012-09-10 MED ORDER — BENZTROPINE MESYLATE 0.5 MG PO TABS: 0.5000 mg | ORAL_TABLET | Freq: Two times a day (BID) | ORAL | Status: DC

## 2012-09-10 NOTE — Social Work (Signed)
Interdisciplinary Treatment Plan Update (Adult)  Date:  09/10/2012  Time Reviewed:  11:30 AM    Progress in Treatment: Attending groups:   Yes   Participating in groups:  Yes Taking medication as prescribed:  Yes Tolerating medication:  Yes Family/Significant othe contact made: Contact made with family Patient understands diagnosis:  Yes Discussing patient identified problems/goals with staff: Yes Medical problems stabilized or resolved: Yes Denies suicidal/homicidal ideation: No -  Patient able to contract for safety Issues/concerns per patient self-inventory:  Other:  New problem(s) identified: None  Reason for Continuation of Hospitalization: Patient be discharged today     Interventions implemented related to continuation of hospitalization:  Medication mgement; safety checks q 15 mins; coping skills development  Additional comments: Patient was encouraged to stay compliant with his medications.  Estimated length of stay: Today  Discharge Plan:  Outpatient follow up scheduled with [facility]  New goal(s):  Review of initial/current patient goals per problem list:    1.  Goal(s): Eliminate SI/other thoughts of self harm   Met:  Yes  Target date: d/c  As evidenced by: Patient will no longer endorse SI/HI or other thoughts of self harm.    2.  Goal (s):Reduce depression/anxiety  Met: Yes  Target date: d/c  As evidenced by: Patient will rate symptoms at four or below    3.  Goal(s):.stabilize on meds   Met:  Yes  Target date: d/c  As evidenced by: Patient will report being stabilized on medications - less symptomatic    4.  Goal(s): Refer for outpatient follow up   Met:  Yes  Target date: d/c  As evidenced by: Follow up appointment will be scheduled    Attendees: Patient:  [name] @TD  11:30 AM  Physican:  Patrick North, MD @TD  11:30 AM  Nursing:   09/10/2012 11:30 AM  Nursing:    @TD  11:30 AM  Clinical Social Worker:  Patton Salles, LCSW @TD  11:30 AM  Other: Norval Gable, FNP 09/10/2012 11:30 AM   Other:         09/10/2012 11:30 AM Other:

## 2012-09-10 NOTE — Progress Notes (Signed)
Patient denies SI/HI, denies A/V hallucinations. Patient verbalizes understanding of discharge instructions, follow up care and prescriptions. Patient given all belongings from [facility] locker. Patient escorted out by staff, transported by public transportation.  

## 2012-09-10 NOTE — [facility] Suicide Risk Assessment (Signed)
Suicide Risk Assessment  Discharge Assessment     Demographic Factors:  Male and Low socioeconomic status  Mental Status Per Nursing Assessment::   On Admission:  Suicidal ideation indicated by patient;Self-harm thoughts;Self-harm behaviors  Current Mental Status by Physician: Patient alert and oriented to 4. Denies AH/VH/SI/HI.  Loss Factors: Decrease in vocational status  Historical Factors: Family history of mental illness or substance abuse and Impulsivity  Risk Reduction Factors:   Employed and Positive social support  Continued Clinical Symptoms:  Alcohol/Substance Abuse/Dependencies  Cognitive Features That Contribute To Risk:  Thought constriction (tunnel vision)    Suicide Risk:  Minimal: No identifiable suicidal ideation.  Patients presenting with no risk factors but with morbid ruminations; may be classified as minimal risk based on the severity of the depressive symptoms  Discharge Diagnoses:   AXIS I:  Bipolar, mixed AXIS II:  No diagnosis AXIS III:   Past Medical History  Diagnosis Date  . Asthma   . Bipolar depression   . Schizophrenia    AXIS IV:  other psychosocial or environmental problems AXIS V:  61-70 mild symptoms  Plan Of Care/Follow-up recommendations:  Activity:  normal Diet:  normal Follow up with outpatient appointments.  Is patient on multiple antipsychotic therapies at discharge:  No   Has Patient had three or more failed trials of antipsychotic monotherapy by history:  No  Recommended Plan for Multiple Antipsychotic Therapies: NA  Tamani Durney 09/10/2012, 9:37 AM

## 2012-09-10 NOTE — Progress Notes (Signed)
[facility] Case Management Discharge Plan:  Will you be returning to the same living situation after discharge: Yes,    At discharge, do you have transportation home?:Yes,    Do you have the ability to pay for your medications:Yes,     Interagency Information:     Release of information consent forms completed and in the chart;  Patient's signature needed at discharge.  Patient to Follow up at: [facility] in [place] walk-in clinic 09/11/2012 between 8 AM and 3 PM    Patient denies SI/HI:   Yes,       Safety Planning and Suicide Prevention discussed:  Yes,     Barrier to discharge identified:No.  Summary and Recommendations: Patient to follow [facility]  And will return home.   [name] 09/10/2012, 10:04 AM
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
## 2012-09-10 NOTE — Progress Notes (Signed)
Patient Discharge Instructions:  After Visit Summary (AVS):   Faxed to:  09/10/12 Psychiatric Admission Assessment Note:   Faxed to:  09/10/12 Suicide Risk Assessment - Discharge Assessment:   Faxed to:  09/10/12 Faxed/Sent to the Next Level Care provider:  09/10/12 Faxed to [facility] @ [phone]  Jerelene Redden, 09/10/2012, 3:54 PM

## 2012-09-10 NOTE — Progress Notes (Signed)
[facility] Adult Inpatient Family/Significant Other Suicide Prevention Education  Suicide Prevention Education:  Contact Attempts: With patient's sister at telephone number [phone] has been identified by the patient as the family member/significant other with whom the patient will be residing, and identified as the person(s) who will aid the patient in the event of a mental health crisis.  With written consent from the patient, two attempts were made to provide suicide prevention education, prior to and/or following the patient's discharge.  We were unsuccessful in providing suicide prevention education.  A suicide education pamphlet was given to the patient to share with family/significant other.  Date and time of first attempt: 09/09/2012 at 5:36 PM Date and time of second attempt: 09/10/2012 and 11:20 AM  [name] 09/10/2012, 10:36 AM

## 2012-09-10 NOTE — Social Work (Signed)
Aftercare Planning Group: 09/10/2012 9:45 AM  Pt attended discharge planning group and actively participated in group.  CSW provided pt with today's workbook.  Pt presents with improved mood stating he is ready to go home. Patient stated he had no suicidal or homicidal ideations and voiced understanding of the need to follow up with his medications with [facility]. Patient states he will go back to his job tomorrow and is happy about discharge.    Patton Salles, LCSW 09/10/2012 11:36 AM                       09/10/2012 3:00 pm

## 2012-09-11 NOTE — Discharge Summary (Signed)
Reviewed

## 2012-09-14 NOTE — Discharge Summary (Signed)
Agree with assessment and plan Ambert Virrueta A. Marylon Verno, M.D. 

## 2012-09-15 NOTE — Progress Notes (Signed)
Patient Discharge Instructions:  After Visit Summary (AVS):   Faxed to:  09/15/12 Psychiatric Admission Assessment Note:   Faxed to:  09/15/12 Suicide Risk Assessment - Discharge Assessment:   Faxed to:  09/15/12 Faxed/Sent to the Next Level Care provider:  09/15/12 Faxed to [facility] @ [phone]  Jerelene Redden, 09/15/2012, 11:39 AM

## 2013-04-08 ENCOUNTER — Encounter (HOSPITAL_COMMUNITY): Payer: Self-pay | Admitting: Emergency Medicine

## 2013-04-08 ENCOUNTER — Emergency Department (HOSPITAL_COMMUNITY)
Admission: EM | Admit: 2013-04-08 | Discharge: 2013-04-09 | Disposition: A | Discharge status: Home / Self Care | Payer: Medicaid Other | Attending: Emergency Medicine | Admitting: Emergency Medicine

## 2013-04-08 DIAGNOSIS — F1110 Opioid abuse, uncomplicated: Secondary | ICD-10-CM | POA: Insufficient documentation

## 2013-04-08 DIAGNOSIS — J45909 Unspecified asthma, uncomplicated: Secondary | ICD-10-CM | POA: Insufficient documentation

## 2013-04-08 DIAGNOSIS — F1210 Cannabis abuse, uncomplicated: Secondary | ICD-10-CM | POA: Insufficient documentation

## 2013-04-08 DIAGNOSIS — F17200 Nicotine dependence, unspecified, uncomplicated: Secondary | ICD-10-CM | POA: Insufficient documentation

## 2013-04-08 DIAGNOSIS — F3289 Other specified depressive episodes: Secondary | ICD-10-CM | POA: Insufficient documentation

## 2013-04-08 DIAGNOSIS — F329 Major depressive disorder, single episode, unspecified: Secondary | ICD-10-CM | POA: Insufficient documentation

## 2013-04-08 DIAGNOSIS — Z765 Malingerer [conscious simulation]: Secondary | ICD-10-CM

## 2013-04-08 DIAGNOSIS — R443 Hallucinations, unspecified: Secondary | ICD-10-CM | POA: Insufficient documentation

## 2013-04-08 DIAGNOSIS — R45851 Suicidal ideations: Secondary | ICD-10-CM | POA: Insufficient documentation

## 2013-04-08 DIAGNOSIS — F209 Schizophrenia, unspecified: Secondary | ICD-10-CM | POA: Insufficient documentation

## 2013-04-08 LAB — COMPREHENSIVE METABOLIC PANEL
ALT: 22 U/L (ref 0–53)
Alkaline Phosphatase: 79 U/L (ref 39–117)
CO2: 31 mEq/L (ref 19–32)
Chloride: 98 mEq/L (ref 96–112)
GFR calc Af Amer: >90 mL/min (ref >90)
Glucose, Bld: 102 mg/dL — ABNORMAL HIGH (ref 70–99)
Potassium: 3.8 mEq/L (ref 3.5–5.1)
Sodium: 139 mEq/L (ref 135–145)
Total Bilirubin: 0.2 mg/dL — ABNORMAL LOW (ref 0.3–1.2)
Total Protein: 7.6 g/dL (ref 6.0–8.3)

## 2013-04-08 LAB — CBC
Hemoglobin: 12.2 g/dL — ABNORMAL LOW (ref 13.0–17.0)
RBC: 4.28 MIL/uL (ref 4.22–5.81)
WBC: 7.9 10*3/uL (ref 4.0–10.5)

## 2013-04-08 LAB — RAPID URINE DRUG SCREEN, HOSP PERFORMED
Barbiturates: NOT DETECTED
Tetrahydrocannabinol: NOT DETECTED

## 2013-04-08 LAB — ACETAMINOPHEN LEVEL: Acetaminophen (Tylenol), Serum: <15 ug/mL (ref 10–30)

## 2013-04-08 MED ORDER — ONDANSETRON HCL 4 MG PO TABS: 4.0000 mg | ORAL_TABLET | Freq: Three times a day (TID) | ORAL | Status: DC | PRN

## 2013-04-08 MED ORDER — ALUM & MAG HYDROXIDE-SIMETH 200-200-20 MG/5ML PO SUSP: 30.0000 mL | ORAL | Status: DC | PRN

## 2013-04-08 MED ORDER — NICOTINE 21 MG/24HR TD PT24
21.0000 mg | MEDICATED_PATCH | Freq: Every day | TRANSDERMAL | Status: DC
  Administered 2013-04-08 – 2013-04-09 (×2): 21 mg via TRANSDERMAL
  Filled 2013-04-08 (×2): qty 1

## 2013-04-08 MED ORDER — ZOLPIDEM TARTRATE 5 MG PO TABS: 5.0000 mg | ORAL_TABLET | Freq: Every evening | ORAL | Status: DC | PRN

## 2013-04-08 MED ORDER — RISPERIDONE 0.5 MG PO TABS
0.2500 mg | ORAL_TABLET | Freq: Two times a day (BID) | ORAL | Status: DC
  Administered 2013-04-08 – 2013-04-09 (×2): 0.25 mg via ORAL
  Filled 2013-04-08 (×2): qty 1

## 2013-04-08 MED ORDER — TRAZODONE HCL 100 MG PO TABS
100.0000 mg | ORAL_TABLET | Freq: Every day | ORAL | Status: DC
  Administered 2013-04-08: 100 mg via ORAL
  Filled 2013-04-08: qty 1

## 2013-04-08 MED ORDER — BENZTROPINE MESYLATE 1 MG PO TABS
0.5000 mg | ORAL_TABLET | Freq: Two times a day (BID) | ORAL | Status: DC
  Administered 2013-04-08: 0.5 mg via ORAL
  Administered 2013-04-09: 10:00:00 via ORAL
  Filled 2013-04-08 (×3): qty 1

## 2013-04-08 NOTE — BH Assessment (Signed)
Assessment Note   [name] is an 22 y.o. male. Pt was brought to [facility] by [employer] who report pt is stating he is suicidal with plan to hang self.  Pt has several prior [facility] admits, reports he has been off his meds for past 2 months.  Pt unable to describe reason for his SI very well, finally stated he was having "girlfriend problems".  When asked how long he said, "couple of days."  Pt also reports he has been depressed for "a couple of days."  Pt reports he cannot contract for safety outside the hospital.  Pt denies HI.  Pt reports he has had auditory hallucinations telling him to harm himself as recently as this morning.  Pt does not appear psychotic.  Pt reports he is staying at the shelter currently.  UDS/BAC both negative.  Axis I: Depressive Disorder NOS Axis II: Deferred Axis III:  Past Medical History  Diagnosis Date  . Asthma   . Bipolar depression   . Schizophrenia    Axis IV: problems with primary support group Axis V: 31-40 impairment in reality testing  Past Medical History:  Past Medical History  Diagnosis Date  . Asthma   . Bipolar depression   . Schizophrenia     Past Surgical History  Procedure Laterality Date  . No past surgeries      Family History: No family history on file.  Social History:  reports that he has been smoking Cigarettes.  He has a 19.5 pack-year smoking history. He does not have any smokeless tobacco history on file. He reports that he uses illicit drugs (Marijuana and Oxycodone). He reports that he does not drink alcohol.  Additional Social History:  Alcohol / Drug Use Pain Medications: Pt denies any drug or alcohol use.  UDS/BAC both negative. History of alcohol / drug use?: No history of alcohol / drug abuse  CIWA: CIWA-Ar BP: 141/60 mmHg Pulse Rate: 84 COWS:    Allergies: No Known Allergies  Home Medications:  (Not in a hospital admission)  OB/GYN Status:  No LMP for male patient.  General Assessment Data Location of  Assessment: [facility] ACT Assessment: Yes Living Arrangements: Other (Comment) (shelter) Can pt return to current living arrangement?: Yes Admission Status: Voluntary     Risk to self Suicidal Ideation: Yes-Currently Present Suicidal Intent: No Is patient at risk for suicide?: Yes Suicidal Plan?: Yes-Currently Present Specify Current Suicidal Plan: hang self Access to Means: No What has been your use of drugs/alcohol within the last 12 months?: denies current use Previous Attempts/Gestures: Yes How many times?: 4 Triggers for Past Attempts: Family contact Intentional Self Injurious Behavior: None Family Suicide History: No Recent stressful life event(s): Conflict (Comment) (with girlfriend) Persecutory voices/beliefs?: Yes Depression: Yes Depression Symptoms: Insomnia;Guilt;Loss of interest in usual pleasures;Feeling worthless/self pity;Feeling angry/irritable Substance abuse history and/or treatment for substance abuse?: No Suicide prevention information given to non-admitted patients: Not applicable  Risk to Others Homicidal Ideation: No Thoughts of Harm to Others: No Current Homicidal Intent: No Current Homicidal Plan: No Access to Homicidal Means: No History of harm to others?: No Assessment of Violence: None Noted Does patient have access to weapons?: No Criminal Charges Pending?: No Does patient have a court date: No  Psychosis Hallucinations: None noted Delusions: None noted  Mental Status Report Appear/Hygiene: Disheveled Eye Contact: Good Motor Activity: Unremarkable Speech: Logical/coherent Level of Consciousness: Alert Mood: Other (Comment) (pleasant) Affect: Appropriate to circumstance Anxiety Level: None Thought Processes: Coherent;Relevant Judgement: Unimpaired Orientation: Person;Place;Time;Situation  Obsessive Compulsive Thoughts/Behaviors: None  Cognitive Functioning Concentration: Normal Memory: Recent Intact;Remote Intact IQ:  Average Insight: Fair Impulse Control: Fair Appetite: Good Weight Loss: 0 Weight Gain: 0 Sleep: Decreased Total Hours of Sleep: 4 Vegetative Symptoms: None  ADLScreening [facility] Assessment Services) Patient's cognitive ability adequate to safely complete daily activities?: Yes Patient able to express need for assistance with ADLs?: Yes Independently performs ADLs?: Yes (appropriate for developmental age)  Abuse/Neglect [facility]) Physical Abuse: Denies Verbal Abuse: Denies Sexual Abuse: Denies  Prior Inpatient Therapy Prior Inpatient Therapy: Yes Prior Therapy Dates: 2014 (three total admits) Prior Therapy Facilty/Provider(s): [facility] Reason for Treatment: psych  Prior Outpatient Therapy Prior Outpatient Therapy: Yes Prior Therapy Dates: 2014, not current Prior Therapy Facilty/Provider(s): [facility] Reason for Treatment: meds  ADL Screening (condition at time of admission) Patient's cognitive ability adequate to safely complete daily activities?: Yes Patient able to express need for assistance with ADLs?: Yes Independently performs ADLs?: Yes (appropriate for developmental age) Weakness of Legs: None Weakness of Arms/Hands: None       Abuse/Neglect Assessment (Assessment to be complete while patient is alone) Physical Abuse: Denies Verbal Abuse: Denies Sexual Abuse: Denies Exploitation of patient/patient's resources: Denies Self-Neglect: Denies Values / Beliefs Cultural Requests During Hospitalization: None Spiritual Requests During Hospitalization: None   Advance Directives (For Healthcare) Advance Directive: Patient does not have advance directive;Patient would not like information    Additional Information 1:1 In Past 12 Months?: No CIRT Risk: No Elopement Risk: No Does patient have medical clearance?: Yes     Disposition:  Disposition Initial Assessment Completed for this Encounter: Yes Disposition of Patient: Inpatient treatment program Type of inpatient  treatment program: Adult  On Site Evaluation by:   Reviewed with Physician:     Lorri Frederick 04/08/2013 11:47 PM

## 2013-04-08 NOTE — ED Provider Notes (Signed)
History    This chart was scribed for non-physician practitioner, Arthor Captain, PA-C, working with Vida Roller, MD by Donne Anon, ED Scribe. This patient was seen in room [facility]3/[facility] and the patient's care was started at 2033.   CSN: [id number]  Arrival date & time 04/08/13  1946   First MD Initiated Contact with Patient 04/08/13 2033      Chief Complaint  Patient presents with  . Medical Clearance    The history is provided by the patient. No language interpreter was used.   HPI Comments: [name] is a 22 y.o. male with hx of schizophrenia and depression, brought in by [employer], who presents to the Emergency Department complaining of SI. He states that he wants to hang himself. He has been off his medication for 2 months now. He reports he hears voices that tell him to kill himself. He denies HI. He denies any illicit drug use. He denies CP, SOB, abdominal pain, urinary symptoms or any other pain.  Past Medical History  Diagnosis Date  . Asthma   . Bipolar depression   . Schizophrenia     Past Surgical History  Procedure Laterality Date  . No past surgeries      No family history on file.  History  Substance Use Topics  . Smoking status: Current Every Day Smoker -- 1.50 packs/day for 13 years    Types: Cigarettes  . Smokeless tobacco: Not on file  . Alcohol Use: No      Review of Systems  Respiratory: Negative for shortness of breath.   Cardiovascular: Negative for chest pain.  Gastrointestinal: Negative for nausea and abdominal pain.  Genitourinary: Negative for dysuria and frequency.  Psychiatric/Behavioral: Positive for suicidal ideas and hallucinations.  All other systems reviewed and are negative.    Allergies  Review of patient's allergies indicates no known allergies.  Home Medications   Current Outpatient Rx  Name  Route  Sig  Dispense  Refill  . benztropine (COGENTIN) 0.5 MG tablet   Oral   Take 1 tablet (0.5 mg total) by mouth 2 (two)  times daily. For prevention of drug induced involuntary movement.   60 tablet   0   . risperiDONE (RISPERDAL) 0.25 MG tablet   Oral   Take 1 tablet (0.25 mg total) by mouth 2 (two) times daily. For mood control   60 tablet   0   . traZODone (DESYREL) 100 MG tablet   Oral   Take 1 tablet (100 mg total) by mouth at bedtime and may repeat dose one time if needed. For sleep   60 tablet   0     Triage Vitals; BP 141/60  Pulse 84  Temp(Src) 99.9 F (37.7 C) (Oral)  Resp 22  Ht 5\' 6"  (1.676 m)  Wt 178 lb (80.74 kg)  BMI 28.74 kg/m2  SpO2 100%  Physical Exam  Nursing note and vitals reviewed. Constitutional: He appears well-developed and well-nourished. No distress.  HENT:  Head: Normocephalic and atraumatic.  Eyes: Conjunctivae are normal.  Neck: Neck supple. No tracheal deviation present.  Cardiovascular: Normal rate, regular rhythm and normal heart sounds.   Pulmonary/Chest: Effort normal and breath sounds normal. No respiratory distress. He has no wheezes. He has no rales.  Abdominal: Soft. He exhibits no distension. There is no tenderness. There is no rebound and no guarding.  Musculoskeletal: Normal range of motion.  Neurological: He is alert.  Skin: Skin is warm and dry.  Psychiatric: He is slowed  and withdrawn. He exhibits a depressed mood. He expresses suicidal ideation.    ED Course  Procedures (including critical care time) DIAGNOSTIC STUDIES: Oxygen Saturation is 100% on RA, normal by my interpretation.    COORDINATION OF CARE: 8:44 PM Discussed treatment plan which includes ACT team consult with pt at bedside and pt agreed to plan.    Labs Reviewed  CBC - Abnormal; Notable for the following:    Hemoglobin 12.2 (*)    HCT 37.0 (*)    All other components within normal limits  COMPREHENSIVE METABOLIC PANEL - Abnormal; Notable for the following:    Glucose, Bld 102 (*)    Total Bilirubin 0.2 (*)    All other components within normal limits  SALICYLATE  LEVEL - Abnormal; Notable for the following:    Salicylate Lvl <2.0 (*)    All other components within normal limits  ACETAMINOPHEN LEVEL  ETHANOL  URINE RAPID DRUG SCREEN (HOSP PERFORMED)   No results found.   No diagnosis found.    MDM  9:43 PM Patient with SI/ AH Plan to kill himself. Not taking meds past 2 months.No acute lab abnormalities. Consult to ACT Patient appears medically clear. Likely needs admission.   I personally performed the services described in this documentation, which was scribed in my presence. The recorded information has been reviewed and is accurate.         Arthor Captain, PA-C 04/09/13 0257  Arthor Captain, PA-C 04/09/13 [phone]

## 2013-04-08 NOTE — ED Notes (Signed)
Pt brought to ED by [employer], pt states he is now SI with plan to hang himself. Pt states he has been off meds x 2 months.

## 2013-04-08 NOTE — ED Notes (Signed)
Pt has been changed into blue scrubs.  One bag of belongings.

## 2013-04-08 NOTE — ED Notes (Signed)
Pt transferred from triage, presents, SI with plan to hang self.  Off meds x 2 mos.  Attempted SI in past, 2 mos ago.  Feeling hopeless.  Pt calm & cooperative at present.

## 2013-04-09 ENCOUNTER — Encounter (HOSPITAL_COMMUNITY): Payer: Self-pay | Admitting: Registered Nurse

## 2013-04-09 DIAGNOSIS — F3289 Other specified depressive episodes: Secondary | ICD-10-CM

## 2013-04-09 DIAGNOSIS — R45851 Suicidal ideations: Secondary | ICD-10-CM

## 2013-04-09 DIAGNOSIS — F329 Major depressive disorder, single episode, unspecified: Secondary | ICD-10-CM

## 2013-04-09 NOTE — ED Provider Notes (Signed)
Medical screening examination/treatment/procedure(s) were performed by non-physician practitioner and as supervising physician I was immediately available for consultation/collaboration.    Rei Medlen D Tarrin Lebow, MD 04/09/13 1546 

## 2013-04-09 NOTE — BHH Counselor (Signed)
Pt evaluated by Assunta Found, NP to follow up with outpatient services @ the following facility:   [facility] [address] [place], [place] [address] Phone: [phone] *A representative/therapist from the facility came to evaluate patient today for services. Patient has completed his initial intake for outpatient services (medication mgt., ACT, etc.) Staff from [facility] will continue to follow up with patient to provide continued support.

## 2013-04-09 NOTE — ED Notes (Signed)
Pt given information during meeting with community ACT team member on how to contact them for assistance.  Pt. Given bus pass and information on how to get to [facility].

## 2013-04-09 NOTE — ED Provider Notes (Signed)
Sleeping this am.  Awaiting psych placement.  Filed Vitals:   04/09/13 0552  BP: 110/60  Pulse: 53  Temp: 97.8 F (36.6 C)  Resp: 18    Psychiatric treatment per psychiatric team.  Pt is medically stable.    Celene Kras, MD 04/09/13 [phone]

## 2013-04-09 NOTE — Consult Note (Signed)
Reason for Consult:Eval for IP psychiatric mgmt Referring Physician: Hyacinth Meeker MD  [name] is an 22 y.o. male.  HPI: pt is a 22 y/o Wm, known to [facility] for multiple prior admissions for mood d/o with passive SI. Pt endorses continued SI concerns stating he would hang himself due to an argument he had  with his GF. Pt also endorses auditory hallucinations telling him to hang himself, but patient denies VH or HI. Pt has been non compliant with his Rx psychotropic medications and or OP psycho therapy.  Past Medical History  Diagnosis Date  . Asthma   . Bipolar depression   . Schizophrenia     Past Surgical History  Procedure Laterality Date  . No past surgeries      No family history on file.  Social History:  reports that he has been smoking Cigarettes.  He has a 19.5 pack-year smoking history. He does not have any smokeless tobacco history on file. He reports that he uses illicit drugs (Marijuana and Oxycodone). He reports that he does not drink alcohol.  Allergies: No Known Allergies  Medications: I have reviewed the patient's current medications.  Results for orders placed during the hospital encounter of 04/08/13 (from the past 48 hour(s))  URINE RAPID DRUG SCREEN (HOSP PERFORMED)     Status: None   Collection Time    04/08/13  8:08 PM      Result Value Range   Opiates NONE DETECTED  NONE DETECTED   Cocaine NONE DETECTED  NONE DETECTED   Benzodiazepines NONE DETECTED  NONE DETECTED   Amphetamines NONE DETECTED  NONE DETECTED   Tetrahydrocannabinol NONE DETECTED  NONE DETECTED   Barbiturates NONE DETECTED  NONE DETECTED   Comment:            DRUG SCREEN FOR MEDICAL PURPOSES     ONLY.  IF CONFIRMATION IS NEEDED     FOR ANY PURPOSE, NOTIFY LAB     WITHIN 5 DAYS.                LOWEST DETECTABLE LIMITS     FOR URINE DRUG SCREEN     Drug Class       Cutoff (ng/mL)     Amphetamine      1000     Barbiturate      200     Benzodiazepine   200     Tricyclics       300   Opiates          300     Cocaine          300     THC              50  ACETAMINOPHEN LEVEL     Status: None   Collection Time    04/08/13  8:14 PM      Result Value Range   Acetaminophen (Tylenol), Serum <15.0  10 - 30 ug/mL   Comment:            THERAPEUTIC CONCENTRATIONS VARY     SIGNIFICANTLY. A RANGE OF 10-30     ug/mL MAY BE AN EFFECTIVE     CONCENTRATION FOR MANY PATIENTS.     HOWEVER, SOME ARE BEST TREATED     AT CONCENTRATIONS OUTSIDE THIS     RANGE.     ACETAMINOPHEN CONCENTRATIONS     >150 ug/mL AT 4 HOURS AFTER     INGESTION AND >50 ug/mL AT 12  HOURS AFTER INGESTION ARE     OFTEN ASSOCIATED WITH TOXIC     REACTIONS.  CBC     Status: Abnormal   Collection Time    04/08/13  8:14 PM      Result Value Range   WBC 7.9  4.0 - 10.5 K/uL   RBC 4.28  4.22 - 5.81 MIL/uL   Hemoglobin 12.2 (*) 13.0 - 17.0 g/dL   HCT 46.9 (*) 62.9 - 52.8 %   MCV 86.4  78.0 - 100.0 fL   MCH 28.5  26.0 - 34.0 pg   MCHC 33.0  30.0 - 36.0 g/dL   RDW 41.3  24.4 - 01.0 %   Platelets 239  150 - 400 K/uL  COMPREHENSIVE METABOLIC PANEL     Status: Abnormal   Collection Time    04/08/13  8:14 PM      Result Value Range   Sodium 139  135 - 145 mEq/L   Potassium 3.8  3.5 - 5.1 mEq/L   Comment: SLIGHT HEMOLYSIS     HEMOLYSIS AT THIS LEVEL MAY AFFECT RESULT   Chloride 98  96 - 112 mEq/L   CO2 31  19 - 32 mEq/L   Glucose, Bld 102 (*) 70 - 99 mg/dL   BUN 12  6 - 23 mg/dL   Creatinine, Ser 2.72  0.50 - 1.35 mg/dL   Calcium 53.6  8.4 - 64.4 mg/dL   Total Protein 7.6  6.0 - 8.3 g/dL   Albumin 4.1  3.5 - 5.2 g/dL   AST 27  0 - 37 U/L   ALT 22  0 - 53 U/L   Alkaline Phosphatase 79  39 - 117 U/L   Total Bilirubin 0.2 (*) 0.3 - 1.2 mg/dL   GFR calc non Af Amer >90  >90 mL/min   GFR calc Af Amer >90  >90 mL/min   Comment:            The eGFR has been calculated     using the CKD EPI equation.     This calculation has not been     validated in all clinical     situations.     eGFR's  persistently     <90 mL/min signify     possible Chronic Kidney Disease.  ETHANOL     Status: None   Collection Time    04/08/13  8:14 PM      Result Value Range   Alcohol, Ethyl (B) <11  0 - 11 mg/dL   Comment:            LOWEST DETECTABLE LIMIT FOR     SERUM ALCOHOL IS 11 mg/dL     FOR MEDICAL PURPOSES ONLY  SALICYLATE LEVEL     Status: Abnormal   Collection Time    04/08/13  8:14 PM      Result Value Range   Salicylate Lvl <2.0 (*) 2.8 - 20.0 mg/dL    No results found.  Review of Systems  Psychiatric/Behavioral: Positive for depression and suicidal ideas. Negative for hallucinations, memory loss and substance abuse. The patient is not nervous/anxious and does not have insomnia.   All other systems reviewed and are negative.   Blood pressure 141/60, pulse 84, temperature 99.9 F (37.7 C), temperature source Oral, resp. rate 22, height 5\' 6"  (1.676 m), weight 80.74 kg (178 lb), SpO2 100.00%. Physical Exam  Vitals reviewed. Constitutional: He is oriented to person, place, and time. He appears well-developed and well-nourished.  HENT:  Head: Normocephalic.  Eyes: Pupils are equal, round, and reactive to light.  Neck: Neck supple. No thyromegaly present.  Cardiovascular: Normal rate.   Respiratory: Effort normal.  Genitourinary:  Exam deferred  Neurological: He is alert and oriented to person, place, and time. No cranial nerve deficit.  Skin: Skin is warm and dry.  Psychiatric:  Proper affect, limited insight and poor decision making and judgement    Assessment/Plan: 1) Recommend D/c from [facility] ED  as suspect patient malingering and needing intensive OP psychotherapy and continued support and compliance with Rx psychotropic medications. 2) Social services to aid in OP stabilization to deter recurrent IP hospitalizations due to psychiatric decompensations  JesusSPENCER [name] 04/09/2013, 5:37 AM   04/09/2013 Follow up of evaluation for inpatient treatment:  Face to face  interview and consulted with Dr. Ladona Ridgel  Patient continues to state that he is having SI thoughts. Patient states that trigger was break up with girlfriend.  Patient prior history with mental health OP services through [facility] and medications Cogentin, Risperdal and Trazodone.   Patient states that he stopped taking his medications 4 months ago because he could not afford them.  Patient states if could have the OP services and help with medications he would be able to contract for safety in order to go home.    Recommendation:  D/C patient home after setting patient up with outpatient services with counseling and medication mgmt, ACT.    Patient referred to [facility] and will visit hospital today to interview/assess/set up services.  Raquelle Pietro B. Shannelle Alguire FNP-BC Family Nurse Practitioner, Board Certified 04/09/2013 10:38 AM

## 2013-05-06 ENCOUNTER — Emergency Department (HOSPITAL_COMMUNITY)
Admission: EM | Admit: 2013-05-06 | Discharge: 2013-05-09 | Disposition: A | Discharge status: Other Acute Inpatient Hospital | Payer: Medicaid Other | Attending: Emergency Medicine | Admitting: Emergency Medicine

## 2013-05-06 ENCOUNTER — Encounter (HOSPITAL_COMMUNITY): Payer: Self-pay | Admitting: Neurology

## 2013-05-06 DIAGNOSIS — F17200 Nicotine dependence, unspecified, uncomplicated: Secondary | ICD-10-CM | POA: Insufficient documentation

## 2013-05-06 DIAGNOSIS — F209 Schizophrenia, unspecified: Secondary | ICD-10-CM | POA: Insufficient documentation

## 2013-05-06 DIAGNOSIS — J45909 Unspecified asthma, uncomplicated: Secondary | ICD-10-CM | POA: Insufficient documentation

## 2013-05-06 DIAGNOSIS — Z79899 Other long term (current) drug therapy: Secondary | ICD-10-CM | POA: Insufficient documentation

## 2013-05-06 DIAGNOSIS — R45851 Suicidal ideations: Secondary | ICD-10-CM | POA: Insufficient documentation

## 2013-05-06 DIAGNOSIS — F319 Bipolar disorder, unspecified: Secondary | ICD-10-CM | POA: Insufficient documentation

## 2013-05-06 DIAGNOSIS — J020 Streptococcal pharyngitis: Secondary | ICD-10-CM | POA: Insufficient documentation

## 2013-05-06 DIAGNOSIS — J0300 Acute streptococcal tonsillitis, unspecified: Secondary | ICD-10-CM

## 2013-05-06 HISTORY — DX: Suicide attempt, initial encounter: T14.91XA

## 2013-05-06 LAB — COMPREHENSIVE METABOLIC PANEL
ALT: 10 U/L (ref 0–53)
Albumin: 3.7 g/dL (ref 3.5–5.2)
Alkaline Phosphatase: 64 U/L (ref 39–117)
BUN: 11 mg/dL (ref 6–23)
Chloride: 99 mEq/L (ref 96–112)
GFR calc Af Amer: >90 mL/min (ref >90)
Glucose, Bld: 141 mg/dL — ABNORMAL HIGH (ref 70–99)
Potassium: 3.9 mEq/L (ref 3.5–5.1)
Sodium: 137 mEq/L (ref 135–145)
Total Bilirubin: 0.5 mg/dL (ref 0.3–1.2)

## 2013-05-06 LAB — URINALYSIS, ROUTINE W REFLEX MICROSCOPIC
Glucose, UA: NEGATIVE mg/dL
Ketones, ur: NEGATIVE mg/dL
Leukocytes, UA: NEGATIVE
Nitrite: NEGATIVE
Protein, ur: NEGATIVE mg/dL

## 2013-05-06 LAB — RAPID URINE DRUG SCREEN, HOSP PERFORMED
Amphetamines: NOT DETECTED
Barbiturates: NOT DETECTED
Benzodiazepines: NOT DETECTED

## 2013-05-06 LAB — CBC WITH DIFFERENTIAL/PLATELET
Hemoglobin: 12.6 g/dL — ABNORMAL LOW (ref 13.0–17.0)
Lymphs Abs: 1 10*3/uL (ref 0.7–4.0)
Monocytes Relative: 8 % (ref 3–12)
Neutro Abs: 9.3 10*3/uL — ABNORMAL HIGH (ref 1.7–7.7)
Neutrophils Relative %: 83 % — ABNORMAL HIGH (ref 43–77)
Platelets: 167 10*3/uL (ref 150–400)
RBC: 4.22 MIL/uL (ref 4.22–5.81)
WBC: 11.2 10*3/uL — ABNORMAL HIGH (ref 4.0–10.5)

## 2013-05-06 LAB — ETHANOL: Alcohol, Ethyl (B): <11 mg/dL (ref 0–11)

## 2013-05-06 MED ORDER — ALUM & MAG HYDROXIDE-SIMETH 200-200-20 MG/5ML PO SUSP: 30.0000 mL | ORAL | Status: DC | PRN

## 2013-05-06 MED ORDER — RISPERIDONE 0.5 MG PO TABS
0.2500 mg | ORAL_TABLET | Freq: Two times a day (BID) | ORAL | Status: DC
  Administered 2013-05-06 – 2013-05-08 (×6): 0.25 mg via ORAL
  Filled 2013-05-06 (×6): qty 1
  Filled 2013-05-06: qty 0.5

## 2013-05-06 MED ORDER — PENICILLIN G BENZATHINE 1200000 UNIT/2ML IM SUSP
1.2000 10*6.[IU] | Freq: Once | INTRAMUSCULAR | Status: AC
  Administered 2013-05-06: 1.2 10*6.[IU] via INTRAMUSCULAR
  Filled 2013-05-06: qty 2

## 2013-05-06 MED ORDER — BENZTROPINE MESYLATE 1 MG PO TABS
0.5000 mg | ORAL_TABLET | Freq: Two times a day (BID) | ORAL | Status: DC
  Administered 2013-05-06 – 2013-05-08 (×6): 0.5 mg via ORAL
  Filled 2013-05-06 (×6): qty 1

## 2013-05-06 MED ORDER — ACETAMINOPHEN 325 MG PO TABS
650.0000 mg | ORAL_TABLET | ORAL | Status: DC | PRN
  Administered 2013-05-06 – 2013-05-07 (×2): 650 mg via ORAL
  Filled 2013-05-06 (×2): qty 2

## 2013-05-06 MED ORDER — IBUPROFEN 400 MG PO TABS
600.0000 mg | ORAL_TABLET | Freq: Three times a day (TID) | ORAL | Status: DC | PRN
  Administered 2013-05-06 – 2013-05-08 (×3): 600 mg via ORAL
  Filled 2013-05-06 (×3): qty 1

## 2013-05-06 MED ORDER — TRAZODONE HCL 50 MG PO TABS
100.0000 mg | ORAL_TABLET | Freq: Every evening | ORAL | Status: DC | PRN
  Administered 2013-05-06 – 2013-05-08 (×3): 100 mg via ORAL
  Filled 2013-05-06 (×3): qty 2

## 2013-05-06 NOTE — BHH Counselor (Signed)
Patient has been referred to [facility] and [facility].
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
## 2013-05-06 NOTE — ED Notes (Addendum)
PATIENT CAN COME TO POD C ROOM 20 WHEN MEDICALLY CLEARED

## 2013-05-06 NOTE — ED Notes (Signed)
Patient has arrived on pod c. He does not have a Comptroller. House coverage has been notified

## 2013-05-06 NOTE — ED Notes (Signed)
PATIENT STATES SORE THROAT ONSET TODAY. HAS GOTTEN WORSE R>L. VISUAL EXAM REVEALS VERY RED SWOLLEN RIGHT THROAT WITH WHITE PATCH.

## 2013-05-06 NOTE — ED Notes (Signed)
Act team in to evaluate patient

## 2013-05-06 NOTE — ED Provider Notes (Signed)
History    CSN: [id number] Arrival date & time 05/06/13  0740  First MD Initiated Contact with Patient 05/06/13 [phone]     Chief Complaint  Patient presents with  . Suicidal   (Consider location/radiation/quality/duration/timing/severity/associated sxs/prior Treatment) HPI Pt with history of bipolar and schizophrenia p/w SI starting this AM with plan to hang himself. Pt reports med compliance. No HI, hallucinations. Pt seen in last month for similar. Admit to marijuana use but denies other drugs.   Past Medical History  Diagnosis Date  . Asthma   . Bipolar depression   . Schizophrenia    Past Surgical History  Procedure Laterality Date  . No past surgeries     No family history on file. History  Substance Use Topics  . Smoking status: Current Every Day Smoker -- 1.50 packs/day for 13 years    Types: Cigarettes  . Smokeless tobacco: Not on file  . Alcohol Use: No    Review of Systems  Constitutional: Negative for fever and chills.  HENT: Negative for neck pain.   Respiratory: Negative for shortness of breath.   Cardiovascular: Negative for chest pain.  Gastrointestinal: Negative for abdominal pain.  Skin: Negative for rash and wound.  Neurological: Negative for dizziness, weakness, light-headedness, numbness and headaches.  Psychiatric/Behavioral: Positive for suicidal ideas. Negative for hallucinations. The patient is nervous/anxious.   All other systems reviewed and are negative.    Allergies  Review of patient's allergies indicates no known allergies.  Home Medications   Current Outpatient Rx  Name  Route  Sig  Dispense  Refill  . risperiDONE (RISPERDAL) 0.25 MG tablet   Oral   Take 1 tablet (0.25 mg total) by mouth 2 (two) times daily. For mood control   60 tablet   0   . traZODone (DESYREL) 100 MG tablet   Oral   Take 1 tablet (100 mg total) by mouth at bedtime and may repeat dose one time if needed. For sleep   60 tablet   0    BP 108/43  Pulse  81  Temp(Src) 99.2 F (37.3 C) (Oral)  Resp 24  Ht 5\' 6"  (1.676 m)  Wt 155 lb (70.308 kg)  BMI 25.03 kg/m2  SpO2 98% Physical Exam  Nursing note and vitals reviewed. Constitutional: He is oriented to person, place, and time. He appears well-developed and well-nourished. No distress.  HENT:  Head: Normocephalic and atraumatic.  Mouth/Throat: Oropharynx is clear and moist.  Eyes: EOM are normal. Pupils are equal, round, and reactive to light.  Neck: Normal range of motion. Neck supple.  Cardiovascular: Normal rate and regular rhythm.   Pulmonary/Chest: Effort normal and breath sounds normal. No respiratory distress. He has no wheezes. He has no rales.  Abdominal: Soft. Bowel sounds are normal.  Musculoskeletal: Normal range of motion. He exhibits no edema and no tenderness.  Neurological: He is alert and oriented to person, place, and time.  Skin: Skin is warm and dry. No rash noted. No erythema.  Psychiatric: He has a normal mood and affect. His behavior is normal.  Calm, goal oriented speech.     ED Course  Procedures (including critical care time) Labs Reviewed  RAPID STREP SCREEN - Abnormal; Notable for the following:    Streptococcus, Group A Screen (Direct) POSITIVE (*)    All other components within normal limits  CBC WITH DIFFERENTIAL - Abnormal; Notable for the following:    WBC 11.2 (*)    Hemoglobin 12.6 (*)  HCT 37.8 (*)    Neutrophils Relative % 83 (*)    Neutro Abs 9.3 (*)    Lymphocytes Relative 9 (*)    All other components within normal limits  COMPREHENSIVE METABOLIC PANEL - Abnormal; Notable for the following:    Glucose, Bld 141 (*)    All other components within normal limits  URINE RAPID DRUG SCREEN (HOSP PERFORMED) - Abnormal; Notable for the following:    Tetrahydrocannabinol POSITIVE (*)    All other components within normal limits  ETHANOL  URINALYSIS, ROUTINE W REFLEX MICROSCOPIC   No results found. 1. Suicidal ideation   2. Strep  tonsillitis     MDM  Will medically screen and have ACT eval.  Pt with increasing temp. Denies neck stiffness, cough, rash. +sore throat that started this AM  Pt with bl tosillar erythema and exudates. Will treat with IM PCN.   Loren Racer, MD 05/06/13 1534

## 2013-05-06 NOTE — ED Notes (Signed)
Pt reporting suicidal thoughts since this morning and panic attacks. States plan, pt is calm, twirling hair at this time.

## 2013-05-06 NOTE — ED Notes (Signed)
Patient is febrile, T - 102.4.  PRN Ibuprofen given

## 2013-05-06 NOTE — ED Notes (Signed)
Pt has been wanded, in paper scrubs.

## 2013-05-06 NOTE — ED Notes (Signed)
AC notified need for sitter 

## 2013-05-07 ENCOUNTER — Emergency Department (HOSPITAL_COMMUNITY): Payer: Medicaid Other

## 2013-05-07 NOTE — ED Notes (Signed)
X-ray at bedside

## 2013-05-07 NOTE — BH Assessment (Signed)
[facility] Assessment Progress Note      Update: Pt pending [facility].  Called [facility] to follow up and per Broadus John, referral never received @ 1810. Referral faxed for review. Called [facility] at [phone], then received call from [facility] stating no beds. [facility] and no answer @ [phone]. Myra Gianotti and beds available per [facility] @ [phone]. Referral faxed for review. Called [facility] and Michelle Nasuti asked Clinical research associate to fax referral @ [phone]. Referral faxed for review. Called [facility] and beds available per [facility]. Referral faxed for review. Called [facility], and no beds per [facility], but he stated to send referral for possible wait list. Referral faxed for review. Updated ED staff.

## 2013-05-07 NOTE — ED Notes (Signed)
Pt state he has been having a fever all day. Rn take away blankets and provided patient with a sheet. Pt states he has been coughing up green substances all day also. MD notified.

## 2013-05-07 NOTE — ED Notes (Signed)
Pt states throat is less sore today. Still with body aches

## 2013-05-08 NOTE — BH Assessment (Signed)
[facility] Assessment Progress Note      Pt was accepted to [facility] by Maryjean Morn to Dr Addison Naegeli at [facility].  PT has MCD, ED staff notified of disposition.
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
## 2013-05-08 NOTE — ED Notes (Signed)
Patient ambulated to restroom. Patient tolerated well.

## 2013-05-08 NOTE — ED Notes (Signed)
Pt given gingerale. Watching TV. Sitter at bedside.

## 2013-05-09 ENCOUNTER — Inpatient Hospital Stay (HOSPITAL_COMMUNITY)
Admission: AD | Admit: 2013-05-09 | Discharge: 2013-05-15 | DRG: 885 | Disposition: A | Discharge status: Home / Self Care | Payer: Medicaid Other | Source: Ambulatory Visit | Attending: Psychiatry | Admitting: Psychiatry

## 2013-05-09 ENCOUNTER — Encounter (HOSPITAL_COMMUNITY): Payer: Self-pay | Admitting: *Deleted

## 2013-05-09 DIAGNOSIS — F1210 Cannabis abuse, uncomplicated: Secondary | ICD-10-CM

## 2013-05-09 DIAGNOSIS — Z79899 Other long term (current) drug therapy: Secondary | ICD-10-CM

## 2013-05-09 DIAGNOSIS — F209 Schizophrenia, unspecified: Secondary | ICD-10-CM

## 2013-05-09 DIAGNOSIS — F259 Schizoaffective disorder, unspecified: Secondary | ICD-10-CM | POA: Diagnosis present

## 2013-05-09 DIAGNOSIS — R45851 Suicidal ideations: Secondary | ICD-10-CM

## 2013-05-09 DIAGNOSIS — F1220 Cannabis dependence, uncomplicated: Secondary | ICD-10-CM | POA: Diagnosis present

## 2013-05-09 DIAGNOSIS — J45909 Unspecified asthma, uncomplicated: Secondary | ICD-10-CM | POA: Diagnosis present

## 2013-05-09 DIAGNOSIS — F3130 Bipolar disorder, current episode depressed, mild or moderate severity, unspecified: Principal | ICD-10-CM | POA: Diagnosis present

## 2013-05-09 DIAGNOSIS — R45850 Homicidal ideations: Secondary | ICD-10-CM

## 2013-05-09 DIAGNOSIS — F319 Bipolar disorder, unspecified: Secondary | ICD-10-CM

## 2013-05-09 MED ORDER — ACETAMINOPHEN 325 MG PO TABS
650.0000 mg | ORAL_TABLET | ORAL | Status: DC | PRN
  Administered 2013-05-09: 650 mg via ORAL

## 2013-05-09 MED ORDER — BENZTROPINE MESYLATE 0.5 MG PO TABS
0.5000 mg | ORAL_TABLET | Freq: Two times a day (BID) | ORAL | Status: DC
  Administered 2013-05-09 – 2013-05-15 (×13): 0.5 mg via ORAL
  Filled 2013-05-09 (×5): qty 1
  Filled 2013-05-09: qty 8
  Filled 2013-05-09 (×7): qty 1
  Filled 2013-05-09: qty 8
  Filled 2013-05-09 (×4): qty 1

## 2013-05-09 MED ORDER — TRAZODONE HCL 100 MG PO TABS: 100.0000 mg | ORAL_TABLET | Freq: Every evening | ORAL | Status: DC | PRN

## 2013-05-09 MED ORDER — IBUPROFEN 600 MG PO TABS
600.0000 mg | ORAL_TABLET | Freq: Three times a day (TID) | ORAL | Status: DC | PRN
  Administered 2013-05-10: 600 mg via ORAL
  Filled 2013-05-09: qty 1

## 2013-05-09 MED ORDER — MAGNESIUM HYDROXIDE 400 MG/5ML PO SUSP: 30.0000 mL | Freq: Every day | ORAL | Status: DC | PRN

## 2013-05-09 MED ORDER — RISPERIDONE 0.25 MG PO TABS
0.2500 mg | ORAL_TABLET | Freq: Two times a day (BID) | ORAL | Status: DC
  Administered 2013-05-09 – 2013-05-11 (×5): 0.25 mg via ORAL
  Filled 2013-05-09 (×10): qty 1

## 2013-05-09 MED ORDER — ALUM & MAG HYDROXIDE-SIMETH 200-200-20 MG/5ML PO SUSP: 30.0000 mL | ORAL | Status: DC | PRN

## 2013-05-09 MED ORDER — TRAZODONE HCL 50 MG PO TABS
50.0000 mg | ORAL_TABLET | Freq: Every evening | ORAL | Status: DC | PRN
  Administered 2013-05-09 – 2013-05-14 (×10): 50 mg via ORAL
  Filled 2013-05-09 (×7): qty 1
  Filled 2013-05-09: qty 2
  Filled 2013-05-09: qty 1
  Filled 2013-05-09: qty 2

## 2013-05-09 NOTE — B[facility] Group Notes (Signed)
[facility] Group Notes:  (Nursing/MHT/Case Management/Adjunct)  Date:  05/09/2013  Time:  3:59 PM  Type of Therapy:  Psychoeducational Skills  Participation Level:  Did Not Attend  Participation Quality:  none  Affect:  none  Cognitive:  none  Insight:  None  Engagement in Group:  none  Modes of Intervention:  Problem-solving  Summary of Progress/Problems: Pt did not attend group.   Jacquelyne Balint Shanta 05/09/2013, 3:59 PM

## 2013-05-09 NOTE — ED Notes (Signed)
Pt requested ginger ale and ice. Pt is given ginger ale in a cup of ice.

## 2013-05-09 NOTE — BHH Group Notes (Signed)
[facility] Group Notes:  (Nursing/MHT/Case Management/Adjunct)  Date:  05/09/2013  Time:  4:01 PM  Type of Therapy:  Psychoeducational Skills  Participation Level:  Did Not Attend  Participation Quality:  na  Affect:  na  Cognitive:  na  Insight:  None  Engagement in Group:  na  Modes of Intervention:  na  Summary of Progress/Problems:  Malva Limes 05/09/2013, 4:01 PM

## 2013-05-09 NOTE — ED Notes (Signed)
Due to downtime at [facility] patient pt is unable to be transferred over to facility. Pt is aware of POC to stay at [facility] until [facility] computer system is working. RN spoke with Charge and patient is still accepted and room is still being held.

## 2013-05-09 NOTE — BHH Suicide Risk Assessment (Signed)
Suicide Risk Assessment  Admission Assessment     Nursing information obtained from:  Patient Demographic factors:  Male Current Mental Status:  Suicidal ideation indicated by patient Loss Factors:  Financial problems / change in socioeconomic status Historical Factors:  Prior suicide attempts Risk Reduction Factors:  NA  CLINICAL FACTORS:   Panic Attacks Bipolar Disorder:   Depressive phase Alcohol/Substance Abuse/Dependencies  COGNITIVE FEATURES THAT CONTRIBUTE TO RISK:  Closed-mindedness Polarized thinking Thought constriction (tunnel vision)    SUICIDE RISK:   Moderate:  Frequent suicidal ideation with limited intensity, and duration, some specificity in terms of plans, no associated intent, good self-control, limited dysphoria/symptomatology, some risk factors present, and identifiable protective factors, including available and accessible social support.  PLAN OF CARE: Supportive approach/coping skills/relapse prevention                               Reassess and address the co morbidites  I certify that inpatient services furnished can reasonably be expected to improve the patient's condition.  Coni Homesley A 05/09/2013, 10:08 AM

## 2013-05-09 NOTE — Progress Notes (Signed)
Patient did attend the evening speaker AA meeting.  
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
## 2013-05-09 NOTE — Tx Team (Signed)
Initial Interdisciplinary Treatment Plan  PATIENT STRENGTHS: (choose at least two) Ability for insight  PATIENT STRESSORS: Financial difficulties Legal issue   PROBLEM LIST: Problem List/Patient Goals Date to be addressed Date deferred Reason deferred Estimated date of resolution  Depression with SI 05/09/13           Anxiety 05/09/13                                          DISCHARGE CRITERIA:  Ability to meet basic life and health needs  PRELIMINARY DISCHARGE PLAN: Placement in alternative living arrangements  PATIENT/FAMIILY INVOLVEMENT: This treatment plan has been presented to and reviewed with the patient, [name].  The patient and family have been given the opportunity to ask questions and make suggestions.  Jacquelyne Balint Shanta 05/09/2013, 9:33 AM

## 2013-05-09 NOTE — ED Notes (Signed)
RN informed of pt BP and temp.

## 2013-05-09 NOTE — ED Notes (Signed)
Called to [facility] and was transferred [facility] was asked to hold Pt. For 2 additional hours this am.
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
## 2013-05-09 NOTE — H&P (Signed)
Psychiatric Admission Assessment Adult  Patient Identification:  [name] Date of Evaluation:  05/09/2013 Chief Complaint:  Schizoaffective Disorder 295.70 History of Present Illness:: Everything went down hill again. Depression, panic attacks. Was here few months ago and left feeling better. Went to stay with a friend. This friend  got him into stealing AC units. Got locked up until June 12. He is out on probation, hopeless. He does side hustles, some work to get money to get the Marijuana. While at the shelter he felt overwhelmed was planning to kill self, tried to hang himself and was stopped Elements:  Location:  in patient. Quality:  unable to function. Severity:  severe. Timing:  every day. Duration:  worst in the last few weeks. Context:  underlying mood dorser, off medications, dependent on marijuana. Associated Signs/Synptoms: Depression Symptoms:  depressed mood, insomnia, fatigue, Suicide ideas with plan : tried to hang himself at the shelter (Hypo) Manic Symptoms:  Impulsivity, Irritable Mood, Labiality of Mood, Anxiety Symptoms:  Excessive Worry, Panic Symptoms, Psychotic Symptoms:  "Not lately" PTSD Symptoms: Negative  Psychiatric Specialty Exam: Physical Exam  Review of Systems  HENT: Negative.   Eyes: Negative.   Respiratory: Positive for cough.   Cardiovascular: Positive for chest pain.  Gastrointestinal: Negative.   Genitourinary: Negative.   Musculoskeletal: Negative.   Skin: Negative.   Neurological: Positive for weakness.  Endo/Heme/Allergies: Negative.   Psychiatric/Behavioral: Positive for depression, suicidal ideas and substance abuse. The patient is nervous/anxious and has insomnia.     Blood pressure 107/73, pulse 114, temperature 98.4 F (36.9 C), temperature source Oral, height 5\' 5"  (1.651 m).There is no weight on file to calculate BMI.  General Appearance: Disheveled  Eye Solicitor::  Fair  Speech:  Clear and Coherent, Slow and not  spotaneous  Volume:  Decreased  Mood:  Anxious, Depressed, Dysphoric and Irritable, hopeless, helpless  Affect:  Restricted  Thought Process:  Coherent and Goal Directed  Orientation:  Full (Time, Place, and Person)  Thought Content:  worries, concerns  Suicidal Thoughts:  Yes.  without intent/plan  Homicidal Thoughts:  No  Memory:  Immediate;   Fair  Judgement:  Fair  Insight:  Shallow  Psychomotor Activity:  Restlessness  Concentration:  Fair  Recall:  Fair  Akathisia:  No  Handed:  Right  AIMS (if indicated):     Assets:  Desire for Improvement  Sleep:       Past Psychiatric History: Diagnosis: Bipolar Depressed, Cannabis Dependence, Anxiety Disorder NOS  Hospitalizations: [facility]  Outpatient Care: [facility] could not afford to pay for the medication stop going  Substance Abuse Care: Denies  Self-Mutilation: Denies  Suicidal Attempts: Yes (twice)  Violent Behaviors: Denies   Past Medical History:   Past Medical History  Diagnosis Date  . Asthma   . Bipolar depression   . Schizophrenia   . Suicide attempt     Allergies:  No Known Allergies PTA Medications: Prescriptions prior to admission  Medication Sig Dispense Refill  . risperiDONE (RISPERDAL) 0.25 MG tablet Take 1 tablet (0.25 mg total) by mouth 2 (two) times daily. For mood control  60 tablet  0  . traZODone (DESYREL) 100 MG tablet Take 1 tablet (100 mg total) by mouth at bedtime and may repeat dose one time if needed. For sleep  60 tablet  0    Previous Psychotropic Medications:  Medication/Dose  Trazodone, Seroquel, Cogentin               Substance Abuse History in the  last 12 months:  yes  Consequences of Substance Abuse: Legal Consequences:  criminal behavior  Social History:  reports that he has been smoking Cigarettes.  He has a 19.5 pack-year smoking history. He does not have any smokeless tobacco history on file. He reports that he uses illicit drugs (Marijuana and Oxycodone). He reports that  he does not drink alcohol. Additional Social History: Pain Medications: none  Prescriptions: none Over the Counter: none History of alcohol / drug use?: No history of alcohol / drug abuse                    Current Place of Residence:  Staying at the shelter Place of Birth:   Family Members: Marital Status:  Single Children:  Sons:  Daughters: 2 Y/O Relationships: Education:  9 th grade could not handle it, lots of harrasement, Bullying Educational Problems/Performance: Religious Beliefs/Practices: History of Abuse (Emotional/Phsycial/Sexual) Occupational Experiences; Never had a job Hotel manager History:  None. Legal History: Felony larceny on probation stealing air conditioners to get money to get high, do strip clubs Did 4 months ( four felonies, different cases)  Hobbies/Interests:  Family History:  History reviewed. No pertinent family history.  Results for orders placed during the hospital encounter of 05/06/13 (from the past 72 hour(s))  RAPID STREP SCREEN     Status: Abnormal   Collection Time    05/06/13 12:26 PM      Result Value Range   Streptococcus, Group A Screen (Direct) POSITIVE (*) NEGATIVE  GLUCOSE, CAPILLARY     Status: Abnormal   Collection Time    05/07/13  7:52 AM      Result Value Range   Glucose-Capillary 103 (*) 70 - 99 mg/dL   Psychological Evaluations:  Assessment:   AXIS I:  Bipolar, Depressed, Marijuana Dependence AXIS II:  Deferred AXIS III:   Past Medical History  Diagnosis Date  . Asthma   . Bipolar depression   . Schizophrenia   . Suicide attempt    AXIS IV:  economic problems, housing problems and problems with primary support group, access to healthcare, legal  AXIS V:  41-50 serious symptoms  Treatment Plan/Recommendations:  Supportive approach/coping skills/relapse prevention                                                                 Reassess and address the co morbidities  Treatment Plan Summary: Daily contact  with patient to assess and evaluate symptoms and progress in treatment Medication management Current Medications:  Current Facility-Administered Medications  Medication Dose Route Frequency Provider Last Rate Last Dose  . acetaminophen (TYLENOL) tablet 650 mg  650 mg Oral Q4H PRN Court Joy, PA-C      . alum & mag hydroxide-simeth (MAALOX/MYLANTA) 200-200-20 MG/5ML suspension 30 mL  30 mL Oral PRN Court Joy, PA-C      . benztropine (COGENTIN) tablet 0.5 mg  0.5 mg Oral BID Court Joy, PA-C      . ibuprofen (ADVIL,MOTRIN) tablet 600 mg  600 mg Oral Q8H PRN Court Joy, PA-C      . magnesium hydroxide (MILK OF MAGNESIA) suspension 30 mL  30 mL Oral Daily PRN Court Joy, PA-C      . risperiDONE (RISPERDAL) tablet 0.25  mg  0.25 mg Oral BID Court Joy, PA-C      . traZODone (DESYREL) tablet 50 mg  50 mg Oral QHS PRN,MR X 1 Court Joy, PA-C        Observation Level/Precautions:  15 minute checks  Laboratory:  As per the ED  Psychotherapy:  Individual/group  Medications:  Reassess for detox/reassess and address the comorbidities  Consultations:    Discharge Concerns:    Estimated LOS: 5-7 days  Other:     I certify that inpatient services furnished can reasonably be expected to improve the patient's condition.   Amber Guthridge A 7/19/20149:47 AM

## 2013-05-09 NOTE — ED Notes (Signed)
Warm blankets given to sitter for Pt.

## 2013-05-09 NOTE — BHH Counselor (Signed)
Adult Psychosocial Assessment Update Interdisciplinary Team  Previous Behavior Health Hospital admissions/discharges:  Admissions Discharges  Date:  08/27/12 Date:  09/10/12  Date:  06/10/12 Date:  06/16/12  Date:  03/08/10 Date:  03/10/10  Date: Date:  Date: Date:   Changes since the last Psychosocial Assessment (including adherence to outpatient mental health and/or substance abuse treatment, situational issues contributing to decompensation and/or relapse). Was arrested in February, in jail until 04/02/13, for possession with intent to sell.  At last hospitalization, was staying with a friend.  Just prior to this hospitalization, was at [facility].  Does not want to go back to that friend's house, because they steal his things.  Did his follow up at [facility], but was not given his medications while in jail.  Has not gone to [facility] since released from jail.  Has been using alcohol casually twice weekly.  Continues to sell drugs, but realizes it can and will lead to prison time.  The patient denied any drug use besides occasional alcohol, which does not agree with what was said during assessment about using marijuana and pain pills.           Discharge Plan 1. Will you be returning to the same living situation after discharge?   Yes:   No:  XX    If no, what is your plan?  Does not know where he will go.    Cannot return to [facility], has used up time.  Was at [facility] about 7-8 months ago.       2. Would you like a referral for services when you are discharged? Yes:  XX   If yes, for what services?  Medication management and therapy - [facility]  No:              Summary and Recommendations (to be completed by the evaluator) This is a 22yo Caucasian male who was hospitalized for increased depression, hopelessness, and suicidal ideation.  He has been in jail most of the time since he was last discharged from [facility].  He has no home, cannot return to the shelter at this time, and  would like to go to [facility] for services.  The patient would benefit from safety monitoring, medication evaluation, psychoeducation, group therapy, and discharge planning to link with ongoing resources.                        Signature:  Sarina Ser, 05/09/2013 1:02 PM

## 2013-05-10 MED ORDER — HYDROCERIN EX CREA
TOPICAL_CREAM | Freq: Four times a day (QID) | CUTANEOUS | Status: DC
  Administered 2013-05-10 – 2013-05-15 (×8): via TOPICAL
  Filled 2013-05-10 (×2): qty 113

## 2013-05-10 NOTE — Progress Notes (Signed)
Patient ID: [name], male   DOB: [birth year], 22 y.o.   MRN: [id number] Pt noted in dayroom to complain of chest pain, reporting this to his peers having peers come to Clinical research associate. Writer assessed patient , who states ''yes I'm having chest pain I don't think I'll make it through the night'' however patient noted to be sitting in the dayroom calmly with peers even noted to laugh with peers from other staff. Vitals obtained and all WNL. Patient is in no noted distress - color pink, skin warm and dry. No noted SOB. Patient states ''it was so bad I didn't think i could get up and walkGlass blower/designer offered prn ibuprofen, which patient accepted (see eMAR) . Writer also notified provider Elmon Kirschner of patients above behaviors and complaints and attention seeking behavior. Will continue to monitor q 15 minutes for safety.

## 2013-05-10 NOTE — Progress Notes (Signed)
D. Pt was up and visible in milieu this evening and did attend and participate in evening group session. Pt did endorse feelings of depression and hopelessness and spoke about living in a shelter and unsure of his plans going forward. Pt also spoke about having feelings of anxiety and having difficulties sleeping at night. Pt did receive medications this evening without incident and did not verbalize any complaints of pain. A. Medication education given, support and encouragement provided. R. Pt verbalized understanding, will continue to monitor.

## 2013-05-10 NOTE — BHH Group Notes (Signed)
BHH Group Notes: (Clinical Social Work)   05/10/2013      Type of Therapy:  Group Therapy   Participation Level:  Did Not Attend    Oddie Kuhlmann Grossman-Orr, LCSW 05/10/2013, 12:10 PM     

## 2013-05-10 NOTE — BHH Group Notes (Signed)
[facility] Group Notes:  (Nursing/MHT/Case Management/Adjunct)  Date:  05/10/2013  Time:  3:22 PM  Type of Therapy:  Psychoeducational Skills  Participation Level:  Active  Participation Quality:  Appropriate  Affect:  Appropriate  Cognitive:  Appropriate  Insight:  Appropriate  Engagement in Group:  Engaged  Modes of Intervention:  Problem-solving  Summary of Progress/Problems: Pt did attend support system group, and was able to engage in treatment.  Jacquelyne Balint Shanta 05/10/2013, 3:22 PM

## 2013-05-10 NOTE — BHH Group Notes (Signed)
B[facility] Group Notes:  (Nursing/MHT/Case Management/Adjunct)   Date:  05/10/2013  Time:  10:10 AM  Type of Therapy:  Psychoeducational Skills  Participation Level:  Did Not Attend  Participation Quality:  did not attend   Affect:  did not attend  Cognitive:  did not attend  Insight:  None  Engagement in Group:  did not attend  Modes of Intervention:  did not attend  Summary of Progress/Problems:  Did not attend group despite encouragement from staff. [name] 05/10/2013, 10:14 AM 

## 2013-05-10 NOTE — Progress Notes (Signed)
D: Patient in the dayroom on approach.  Patient child-like and attention seeking.  Patient acts silly and jokes around.  Patient states he is depressed but not consistant with his affect.  Patient states he is passive SI but contracts for safety.  Patient denies HI and denies AVH.  Patient flirtatious with females patients on the unit.  Patient has to be redirected and he is redirectable.    A: Staff to monitor Q 15 mins for safety.  Encouragement and support offered.  Scheduled medications administered per orders.  Trazodone administered prn for sleep  R: Patient remains safe on the unit.  Patient attended group tonight.  Patient attended group tonight.  Patient visible on the unit and interacting with peers.  Patient cooperative and taking administered medications.

## 2013-05-10 NOTE — Progress Notes (Signed)
Patient did attend the evening speaker AA meeting.  

## 2013-05-10 NOTE — Progress Notes (Signed)
Patient ID: [name], male   DOB: 08/08/1991, 23 y.o.   MRN: [id number] D. Patient presents with depressed mood, affect flat. Pt has remained isolative to room throughout shift thus far, minimal interaction with peers and minimal interaction with staff upon approach. Pt has declined to attend all unit programming throughout shift thus far.  Patient states '' i'm stable, fine, my god i just don't wana talk '' to Clinical research associate. A. Allowed pt to ventilate, support given. Pt completed self inventory sheet and did report SI, contracts for safety verbally with writer rates mood 8/10 on depression scale 10 being worst depression 1 being none. R. Patient denies any acute concerns , calm and cooperative at this time. will continue to monitor q 15 minutes for safety.

## 2013-05-10 NOTE — Progress Notes (Signed)
[facility] MD Progress Note  05/10/2013 1:21 PM [name]  MRN:  [id number] Subjective:  States he does not feel right. He states he feels tired, feeling achy, "not right." Still with suicidal ideas. He wants to stay in bed until he feels better.  Diagnosis:  Bipolar Depression, Cannabis Dependence  ADL's:  Intact  Sleep: Fair  Appetite:  Poor  Suicidal Ideation:  Plan:  denies Intent:  denies Means:  denies Homicidal Ideation:  Plan:  denies Intent:  denies Means:  denies AEB (as evidenced by):  Psychiatric Specialty Exam: Review of Systems  Constitutional: Positive for malaise/fatigue.  HENT: Negative.   Eyes: Negative.   Respiratory: Positive for cough and sputum production.   Cardiovascular: Negative.   Gastrointestinal: Negative.   Genitourinary: Negative.   Musculoskeletal: Negative.   Skin: Negative.   Neurological: Positive for weakness.  Endo/Heme/Allergies: Negative.   Psychiatric/Behavioral: Positive for substance abuse. The patient is nervous/anxious.     Blood pressure 103/71, pulse 85, temperature 98.4 F (36.9 C), temperature source Oral, resp. rate 16, height 5\' 5"  (1.651 m).There is no weight on file to calculate BMI.  General Appearance: Disheveled  Eye Contact::  Minimal  Speech:  Clear and Coherent, Slow and not spontaneous  Volume:  Decreased  Mood:  Anxious and Depressed, tired, weak  Affect:  Restricted  Thought Process:  Coherent and Goal Directed  Orientation:  Full (Time, Place, and Person)  Thought Content:  somatically focused  Suicidal Thoughts:  Yes.  without intent/plan  Homicidal Thoughts:  No  Memory:  Immediate;   Fair Recent;   Fair Remote;   Fair  Judgement:  Fair  Insight:  Shallow  Psychomotor Activity:  Psychomotor Retardation  Concentration:  Fair  Recall:  Fair  Akathisia:  No  Handed:  Right  AIMS (if indicated):     Assets:  Desire for Improvement  Sleep:  Number of Hours: 5.75   Current Medications: Current  Facility-Administered Medications  Medication Dose Route Frequency Provider Last Rate Last Dose  . acetaminophen (TYLENOL) tablet 650 mg  650 mg Oral Q4H PRN Court Joy, PA-C   650 mg at 05/09/13 1148  . alum & mag hydroxide-simeth (MAALOX/MYLANTA) 200-200-20 MG/5ML suspension 30 mL  30 mL Oral PRN Court Joy, PA-C      . benztropine (COGENTIN) tablet 0.5 mg  0.5 mg Oral BID Court Joy, PA-C   0.5 mg at 05/10/13 7829  . ibuprofen (ADVIL,MOTRIN) tablet 600 mg  600 mg Oral Q8H PRN Court Joy, PA-C      . magnesium hydroxide (MILK OF MAGNESIA) suspension 30 mL  30 mL Oral Daily PRN Court Joy, PA-C      . risperiDONE (RISPERDAL) tablet 0.25 mg  0.25 mg Oral BID Court Joy, PA-C   0.25 mg at 05/10/13 5621  . traZODone (DESYREL) tablet 50 mg  50 mg Oral QHS PRN,MR X 1 Court Joy, PA-C   50 mg at 05/09/13 2206    Lab Results: No results found for this or any previous visit (from the past 48 hour(s)).  Physical Findings: AIMS:  , ,  ,  ,    CIWA:    COWS:     Treatment Plan Summary: Daily contact with patient to assess and evaluate symptoms and progress in treatment Medication management  Plan: Supportive approach/coping skills/relapse prevention           Reassess and continue to address co morbidities  Follow further recommendations for the Strep throat management  Medical Decision Making Problem Points:  Review of psycho-social stressors (1) Data Points:  Review of medication regiment & side effects (2)  I certify that inpatient services furnished can reasonably be expected to improve the patient's condition.   Houa Nie A 05/10/2013, 1:21 PM

## 2013-05-10 NOTE — Progress Notes (Signed)
Adult Psychoeducational Group Note  Date:  05/10/2013 Time:  7:08 PM  Group Topic/Focus:  Perspective: Through this group patients will identify negative irrational thoughts about themselves and how looking at things in a different perspective can help to challenge those thoughts. Patients will participate in an activity identifying two images in one picture. They will discuss what the members saw in the pictures and the importance of having different perspectives.  Participation Level:  Active  Participation Quality:  Appropriate and Attentive  Affect:  Blunted  Cognitive:  Alert and Appropriate  Insight: Improving  Engagement in Group:  Improving  Modes of Intervention:  Education  Additional Comments:  Pt appears to understand the purpose of this group.   Dalia Heading 05/10/2013, 7:08 PM

## 2013-05-11 MED ORDER — QUETIAPINE FUMARATE 50 MG PO TABS
50.0000 mg | ORAL_TABLET | Freq: Every day | ORAL | Status: DC
  Administered 2013-05-12 – 2013-05-13 (×2): 50 mg via ORAL
  Filled 2013-05-11 (×2): qty 1
  Filled 2013-05-11: qty 20
  Filled 2013-05-11 (×2): qty 1

## 2013-05-11 MED ORDER — FAMOTIDINE 20 MG PO TABS
20.0000 mg | ORAL_TABLET | Freq: Two times a day (BID) | ORAL | Status: DC
  Administered 2013-05-11 – 2013-05-15 (×8): 20 mg via ORAL
  Filled 2013-05-11 (×13): qty 1

## 2013-05-11 MED ORDER — QUETIAPINE FUMARATE 50 MG PO TABS: 50.0000 mg | ORAL_TABLET | Freq: Every day | ORAL | Status: DC

## 2013-05-11 MED ORDER — QUETIAPINE FUMARATE 50 MG PO TABS
50.0000 mg | ORAL_TABLET | Freq: Once | ORAL | Status: AC
  Administered 2013-05-11: 50 mg via ORAL
  Filled 2013-05-11: qty 1

## 2013-05-11 MED ORDER — ONDANSETRON 4 MG PO TBDP
8.0000 mg | ORAL_TABLET | Freq: Three times a day (TID) | ORAL | Status: DC | PRN
  Administered 2013-05-11: 8 mg via ORAL

## 2013-05-11 MED ORDER — QUETIAPINE FUMARATE 100 MG PO TABS
100.0000 mg | ORAL_TABLET | Freq: Two times a day (BID) | ORAL | Status: DC
  Administered 2013-05-11 – 2013-05-15 (×9): 100 mg via ORAL
  Filled 2013-05-11 (×13): qty 1

## 2013-05-11 MED ORDER — QUETIAPINE FUMARATE 100 MG PO TABS
ORAL_TABLET | ORAL | Status: AC
  Administered 2013-05-11: 100 mg
  Filled 2013-05-11: qty 1

## 2013-05-11 MED ORDER — FAMOTIDINE 20 MG PO TABS
ORAL_TABLET | ORAL | Status: AC
  Administered 2013-05-11: 20:00:00
  Filled 2013-05-11: qty 1

## 2013-05-11 NOTE — Tx Team (Signed)
Interdisciplinary Treatment Plan Update (Adult)  Date: 05/11/2013   Time Reviewed: 10:04 AM  Progress in Treatment:  Attending groups: No. Participating in groups:  No. Taking medication as prescribed: Yes  Tolerating medication: Yes  Family/Significant othe contact made: Not yet. SPE required for this pt.   Patient understands diagnosis: Yes, AEB seeking treatment for SI, depression, bipolar disorder, and cannabis abuse. Discussing patient identified problems/goals with staff: Yes  Medical problems stabilized or resolved: Yes  Denies suicidal/homicidal ideation: Passive SI but can contract for safety.  Patient has not harmed self or Others: Yes  New problem(s) identified:  Discharge Plan or Barriers: Pt will likely follow up at [facility] for med management. He did not attend morning d/c planning group. CSW and pt will continue to explore available options (therapy). Additional comments: Patient is a 22 year old male reporting SI with a plan to hang himself due to overwhelming feelings of depression and hopelessness. Patient reports that he began to experience these thoughts of wanting to hurt himself yesterday. Patient reports that he is not able to contract for safety. Patient reports a prior history of psychiatric hospitalizations due to SI. Patient reports being diagnosed with Bipolar Disorder, Schizoaffective Disorder and Post Traumatic stress Disorder. Patient reports that he has received medication management in the past from [facility]. However, patient reports that he does not remember the last time that he has taken his medication. Patient reports previous incidents in which he has had auditory hallucinations telling him to harm himself. Patient denies current auditory hallucinations at this time. Patient reports a family history of mental illness.   Reason for Continuation of Hospitalization: SI Mood stabilization Medication management Estimated length of stay: 2-4 days For review of  initial/current patient goals, please see plan of care.  Attendees:  Patient:    Family:    Physician: Geoffery Lyons MD 05/11/2013 10:04 AM   Nursing: Maureen Ralphs RN 05/11/2013 10:05 AM   Clinical Social Worker Staysha Truby Smart, LCSWA  05/11/2013 10:05 AM   Other: Sue Lush RN 05/11/2013 10:05 AM   Other: Aggie PA 05/11/2013 10:05 AM   Other: Massie Kluver, Community Care Coordinator  05/11/2013 10:05 AM   Other: Darden Dates Nurse CM 05/11/2013 10:05 AM   Scribe for Treatment Team:  Herbert Seta Smart LCSWA 05/11/2013 10:05 AM

## 2013-05-11 NOTE — [facility] Group Notes (Signed)
[facility] LCSW Group Therapy  05/11/2013 4:53 PM  Type of Therapy:  Group Therapy  Participation Level:  Did Not Attend   Smart, Herbert Seta 05/11/2013, 4:53 PM

## 2013-05-11 NOTE — Progress Notes (Signed)
[facility] MD Progress Note  05/11/2013 3:27 PM [name]  MRN:  [id number] Subjective:  [name] endorses that he is having a hard time. He states he is feeling very agitated, angry. Thinks the Risperdal is making things worst for him. He states he did better on the Serquel. He is still not feeling completely well physically wise and he is not sleeping that well. He state he is depressed, anxious, irritated and still with suicidal ideas Diagnosis:  Bipolar Disorder/Cannabis Abuse  ADL's:  Intact  Sleep: Poor  Appetite:  Poor  Suicidal Ideation:  Plan:  denies Intent:  denies Means:  denies Homicidal Ideation:  Plan:  denies Intent:  denies Means:  denies AEB (as evidenced by):  Psychiatric Specialty Exam: Review of Systems  Constitutional: Positive for malaise/fatigue.  HENT: Negative.   Eyes: Negative.   Respiratory: Negative.   Cardiovascular: Negative.   Gastrointestinal: Negative.   Genitourinary: Negative.   Musculoskeletal: Negative.   Skin: Negative.   Neurological: Negative.   Endo/Heme/Allergies: Negative.   Psychiatric/Behavioral: Positive for depression, suicidal ideas and substance abuse. The patient is nervous/anxious and has insomnia.     Blood pressure 123/83, pulse 61, temperature 97.7 F (36.5 C), temperature source Oral, resp. rate 16, height 5\' 5"  (1.651 m).There is no weight on file to calculate BMI.  General Appearance: Disheveled  Eye Solicitor::  Fair  Speech:  Clear and Coherent and rapid  Volume:  fluctuates  Mood:  Anxious, Depressed, Dysphoric and Irritable  Affect:  anxious, tense, irritated  Thought Process:  Coherent and Goal Directed  Orientation:  Full (Time, Place, and Person)  Thought Content:  worries, concerns, fear of losing control  Suicidal Thoughts:  Yes.  without intent/plan  Homicidal Thoughts:  No  Memory:  Immediate;   Fair Recent;   Fair Remote;   Fair  Judgement:  Fair  Insight:  Shallow  Psychomotor Activity:  Restlessness   Concentration:  Fair  Recall:  Fair  Akathisia:  No  Handed:  Right  AIMS (if indicated):     Assets:  Desire for Improvement  Sleep:  Number of Hours: 6.5   Current Medications: Current Facility-Administered Medications  Medication Dose Route Frequency Provider Last Rate Last Dose  . acetaminophen (TYLENOL) tablet 650 mg  650 mg Oral Q4H PRN Court Joy, PA-C   650 mg at 05/09/13 1148  . alum & mag hydroxide-simeth (MAALOX/MYLANTA) 200-200-20 MG/5ML suspension 30 mL  30 mL Oral PRN Court Joy, PA-C      . benztropine (COGENTIN) tablet 0.5 mg  0.5 mg Oral BID Court Joy, PA-C   0.5 mg at 05/11/13 9562  . hydrocerin (EUCERIN) cream   Topical QID Verne Spurr, PA-C      . ibuprofen (ADVIL,MOTRIN) tablet 600 mg  600 mg Oral Q8H PRN Court Joy, PA-C   600 mg at 05/10/13 1845  . magnesium hydroxide (MILK OF MAGNESIA) suspension 30 mL  30 mL Oral Daily PRN Court Joy, PA-C      . risperiDONE (RISPERDAL) tablet 0.25 mg  0.25 mg Oral BID Court Joy, PA-C   0.25 mg at 05/11/13 0819  . traZODone (DESYREL) tablet 50 mg  50 mg Oral QHS PRN,MR X 1 Court Joy, PA-C   50 mg at 05/10/13 2214    Lab Results: No results found for this or any previous visit (from the past 48 hour(s)).  Physical Findings: AIMS:  , ,  ,  ,    CIWA:  COWS:     Treatment Plan Summary: Daily contact with patient to assess and evaluate symptoms and progress in treatment Medication management  Plan: Supportive approach/coping skills/relapse prevention           D/C the Risperdal            Seroquel 100 mg in AM, 50 mg 3 PM, 100 mg HS  Medical Decision Making Problem Points:  Review of psycho-social stressors (1) Data Points:  Review of medication regiment & side effects (2) Review of new medications or change in dosage (2)  I certify that inpatient services furnished can reasonably be expected to improve the patient's condition.   Ilyaas Musto A 05/11/2013, 3:27 PM

## 2013-05-11 NOTE — Progress Notes (Signed)
Patient ID: [name], male   DOB: 1991/04/30, 22 y.o.   MRN: [id number]  D: Pt denies SI/HI/AVH. Pt is  Cooperative after coaxing. Pt continues to be childlike, impatient and rude. Pt is argumentative and agitated about a stomach ache he had all day. Pt was given ginger ale all day.    A: Pt was offered support and encouragement. Pt was given scheduled medications. Pt was encourage to attend groups. Q 15 minute checks were done for safety.   R:Pt attends groups and interacts well with peers and staff. Pt is taking medication. Pt has no complaints.Pt receptive to treatment and safety maintained on unit.

## 2013-05-11 NOTE — [facility] Group Notes (Signed)
[facility] LCSW Aftercare Discharge Planning Group Note   05/11/2013 9:30 AM  Participation Quality:  DID NOT ATTEND   Smart, Nai Borromeo   

## 2013-05-11 NOTE — Progress Notes (Signed)
D:  Per pt self inventory pt reports sleeping well, appetite good, energy level low, ability to pay attention good, rates depression at a 9 out of 10 and hopelessness at a 9 out of 10.  Pt denies HI/AVH endorses SI contracts for safety.  Pt isolates in room for much of the day sleeping, forwards little, flat and depressed.     A:  Emotional support provided, Encouraged pt to continue with treatment plan and attend all group activities, q15 min checks maintained for safety.  R:  Pt is not participating in groups at this time, pt has been pleasant with staff, childlike in talking with staff, slow responses.

## 2013-05-12 DIAGNOSIS — F319 Bipolar disorder, unspecified: Secondary | ICD-10-CM

## 2013-05-12 DIAGNOSIS — F1210 Cannabis abuse, uncomplicated: Secondary | ICD-10-CM

## 2013-05-12 NOTE — Progress Notes (Signed)
D: Patient in the dayroom interacting with peers on approach.  Patient stated he refused to speak to writer because he did not want to.  After peers encouraged him patient spoke to Clinical research associate but used profanity and stated he does not like this place and states he is tired of being disrespected by staff.  Writer asked patient if something had happened and patient stated he is tired of all of the staff here and their nasty attitudes.  Patient states if this is how it is going to be then he wants to be discharged.  Patient denies SI/HI and denies AVH. A: Staff to monitor Q 15 mins for safety.  Encouragement and support offered.  Scheduled medications administered per orders.  Trazodone administered prn of sleep. R: Patient remains safe on the unit.  Patient attended group tonight. Patient did calm down after speaking with Clinical research associate.  Patient visible on the unit and interacting with peers.  Patient taking administered medications.

## 2013-05-12 NOTE — [facility] Group Notes (Signed)
[facility] LCSW Group Therapy  05/12/2013 1:26 PM  Type of Therapy:  Group Therapy  Participation Level:  Did Not Attend  [name], [name] Maricle 05/12/2013, 1:26 PM

## 2013-05-12 NOTE — Progress Notes (Signed)
The focus of this group is to educate the patient on the purpose and policies of crisis stabilization and provide a format to answer questions about their admission.  The group details unit policies and expectations of patients while admitted. Patient did not attend. 

## 2013-05-12 NOTE — Progress Notes (Signed)
Adult Psychoeducational Group Note  Date:  05/12/2013 Time:  11:10 AM  Group Topic/Focus:  theraputic activity  Participation Level:  Did Not Attend  Participation Quality:  did not attend  Affect:  did not attend  Cognitive:  Did not attend   Insight: None  Engagement in Group:  None  Modes of Intervention:  Activity  Additional Comments:  [name] did not attend group.   Nichola Sizer 05/12/2013, 11:10 AM

## 2013-05-12 NOTE — [facility] Suicide Risk Assessment (Signed)
[facility] INPATIENT:  Family/Significant Other Suicide Prevention Education  Suicide Prevention Education:  Patient Refusal for Family/Significant Other Suicide Prevention Education: The patient [name] has refused to provide written consent for family/significant other to be provided Family/Significant Other Suicide Prevention Education during admission and/or prior to discharge.  Physician notified.  [name] refused to consent to CSW contact with family/friend for SPE. SPE completed with pt and pt provided with SPI pamphlet. Pt encouraged to ask questions/talk about concerns related to SPE. Pt also given number to Mobile Crisis.   Smart, Andrey Mccaskill 05/12/2013, 2:58 PM

## 2013-05-12 NOTE — Progress Notes (Signed)
D: Patient resting in bed with eyes closed.  Respirations even and unlabored.  Patient appears to be in no apparent distress. A: Staff to monitor Q 15 mins for safety.   R:Patient remains safe on the unit.  

## 2013-05-12 NOTE — Progress Notes (Signed)
D: Patient denies HI and A/V hallucinations and admits to some thoughts of SI; patient reports sleep is well; reports appetite is good ; reports energy level is low ; reports ability to pay attention is good; rates depression as 8/10; rates hopelessness 8/10;   A: Monitored q 15 minutes; patient encouraged to attend groups; patient educated about medications; patient given medications per physician orders; patient encouraged to express feelings and/or concerns  R: Patient is sad and depressed; patient is unwilling to participate ; patient's interaction with staff and peers is appropriate but minimal; patient was able to set goal to talk with staff 1:1 when having feelings of SI; patient is taking medications as prescribed and tolerating medications; patient is not attending groups

## 2013-05-12 NOTE — Progress Notes (Signed)
Patient ID: [name], male   DOB: Jul 30, 1991, 22 y.o.   MRN: [id number] [facility] MD Progress Note  05/12/2013 2:13 PM [name]  MRN:  [id number] Subjective:  Nature endorses that he is having a rough time today. He says that he is still having suicidal thoughts, but denies any plans and or intent to hurt self. Says, sleep is not good. Waiting on Seroquel to kick in and help him. He says he feels irritated and agitated, and that is why he could not go group. He rated his depression #8 and anxiety at #9.  Diagnosis:  Bipolar Disorder/Cannabis Abuse  ADL's:  Intact  Sleep: Poor  Appetite:  Poor  Suicidal Ideation:  Plan:  denies Intent:  denies Means:  denies Homicidal Ideation:  Plan:  denies Intent:  denies Means:  denies AEB (as evidenced by):  Psychiatric Specialty Exam: Review of Systems  Constitutional: Positive for malaise/fatigue.  HENT: Negative.   Eyes: Negative.   Respiratory: Negative.   Cardiovascular: Negative.   Gastrointestinal: Negative.   Genitourinary: Negative.   Musculoskeletal: Negative.   Skin: Negative.   Neurological: Negative.   Endo/Heme/Allergies: Negative.   Psychiatric/Behavioral: Positive for depression, suicidal ideas and substance abuse. The patient is nervous/anxious and has insomnia.     Blood pressure 134/72, pulse 50, temperature 97.3 F (36.3 C), temperature source Oral, resp. rate 16, height 5\' 5"  (1.651 m).There is no weight on file to calculate BMI.  General Appearance: Disheveled  Eye Solicitor::  Fair  Speech:  Clear and Coherent and rapid  Volume:  fluctuates  Mood:  Anxious, Depressed, Dysphoric and Irritable  Affect:  anxious, tense, irritated  Thought Process:  Coherent and Goal Directed  Orientation:  Full (Time, Place, and Person)  Thought Content:  worries, concerns, fear of losing control  Suicidal Thoughts:  Yes.  without intent/plan  Homicidal Thoughts:  No  Memory:  Immediate;   Fair Recent;   Fair Remote;   Fair   Judgement:  Fair  Insight:  Shallow  Psychomotor Activity:  Restlessness  Concentration:  Fair  Recall:  Fair  Akathisia:  No  Handed:  Right  AIMS (if indicated):     Assets:  Desire for Improvement  Sleep:  Number of Hours: 6.5   Current Medications: Current Facility-Administered Medications  Medication Dose Route Frequency Provider Last Rate Last Dose  . acetaminophen (TYLENOL) tablet 650 mg  650 mg Oral Q4H PRN Court Joy, PA-C   650 mg at 05/09/13 1148  . alum & mag hydroxide-simeth (MAALOX/MYLANTA) 200-200-20 MG/5ML suspension 30 mL  30 mL Oral PRN Court Joy, PA-C      . benztropine (COGENTIN) tablet 0.5 mg  0.5 mg Oral BID Court Joy, PA-C   0.5 mg at 05/12/13 0865  . famotidine (PEPCID) tablet 20 mg  20 mg Oral BID Kerry Hough, PA-C   20 mg at 05/12/13 7846  . hydrocerin (EUCERIN) cream   Topical QID Verne Spurr, PA-C      . ibuprofen (ADVIL,MOTRIN) tablet 600 mg  600 mg Oral Q8H PRN Court Joy, PA-C   600 mg at 05/10/13 1845  . magnesium hydroxide (MILK OF MAGNESIA) suspension 30 mL  30 mL Oral Daily PRN Court Joy, PA-C      . ondansetron (ZOFRAN-ODT) disintegrating tablet 8 mg  8 mg Oral Q8H PRN Kerry Hough, PA-C   8 mg at 05/11/13 2124  . QUEtiapine (SEROQUEL) tablet 100 mg  100 mg Oral BID Madie Reno  Jorja Loa, MD   100 mg at 05/12/13 4098  . QUEtiapine (SEROQUEL) tablet 50 mg  50 mg Oral Q1500 Rachael Fee, MD      . traZODone (DESYREL) tablet 50 mg  50 mg Oral QHS PRN,MR X 1 Court Joy, PA-C   50 mg at 05/11/13 2122    Lab Results: No results found for this or any previous visit (from the past 48 hour(s)).  Physical Findings: AIMS:  , ,  ,  ,    CIWA:    COWS:     Treatment Plan Summary: Daily contact with patient to assess and evaluate symptoms and progress in treatment Medication management  Plan: Supportive approach/coping skills/relapse prevention. Encouraged out of room, participation in group sessions and application of  coping skills when distressed. Will continue to monitor response to/adverse effects of medications in use to assure effectiveness. Continue to monitor mood, behavior and interaction with staff and other patients. Continue current plan of care.  Medical Decision Making Problem Points:  Review of psycho-social stressors (1) Data Points:  Review of medication regiment & side effects (2) Review of new medications or change in dosage (2)  I certify that inpatient services furnished can reasonably be expected to improve the patient's condition.   Armandina Stammer I, PMHNP-BC 05/12/2013, 2:13 PM

## 2013-05-13 NOTE — [facility] Group Notes (Signed)
[facility] LCSW Group Therapy  05/13/2013 2:39 PM  Type of Therapy:  Group Therapy  Participation Level:  Did Not Attend   SmartHerbert Seta 05/13/2013, 2:39 PM

## 2013-05-13 NOTE — Progress Notes (Signed)
Recreation Therapy Notes  Date: 07.22.2014 Time: 2:30pm Location: [address] Dayroom     Group Topic/Focus: Engineer, structural Activities (AAA)  Participation Level: Did not attend.  Marykay Lex Takeria Marquina, LRT/CTRS  Georgena Weisheit L 05/13/2013 8:48 AM

## 2013-05-13 NOTE — [facility] Group Notes (Signed)
[facility] LCSW Aftercare Discharge Planning Group Note   05/13/2013 9:48 AM  Participation Quality: DID NOT ATTEND   Smart, Herbert Seta

## 2013-05-13 NOTE — Progress Notes (Addendum)
Patient ID: [name], male   DOB: 28-Jan-1991, 22 y.o.   MRN: [id number] Pt was asleep in his bed and did not attend 11 am group group.

## 2013-05-13 NOTE — Progress Notes (Signed)
Recreation Therapy Notes  Date: 07.22.2014 Time: 3:00pm Location: [address] Dayroom      Group Topic/Focus: Problem Solving  Participation Level: Minimal  Participation Quality: Inapprorpaite  Affect: Euthymic  Cognitive: Appropriate  Additional Comments: Activity: Life Boat ; Explanation: Patients were given the following scenario: We have chartered a Radio producer for the afternoon with the below listed guests, half way through out trip the [employer] a leak and we start to sink. We can put everyone in the group on a life raft and sail back to shore. Including the people in this room we can eight of out guests. The list of guest is as follows: Materials engineer, [address], Janyth Pupa, Mitzi Hansen, Runner, broadcasting/film/video, Chef, Curator, Personnel officer, Ex-Convict, Ex-Marine, Pregnant Woman, Male Physician, Nurse.   Patient arrived to group session at approximately 3:40pm. Patient gravitated towards male peers making inappropriate comments and jokes during activity. Patient did not contribute to inappropriate statements, however he did encourage them by laughing with them and high fives and fist bumps to peers when inappropriate statements were made. Patient did not otherwise participate in group session.   Marykay Lex Telisa Ohlsen, LRT/CTRS  Lisa-Marie Rueger L 05/13/2013 8:27 AM

## 2013-05-13 NOTE — Progress Notes (Signed)
D:  Patient in the dayroom interacting with peers on approach.  Patient still silly and child-like.  Patient states he continues to be depressed.  Patient states he has been sleeping most of the day.  Patient states he has passive SI.  Patient denies HI and denies AVH.   A: Staff to monitor Q 15 mins for safety.  Encouragement and support offered.  Scheduled medications administered per orders.  Trazodone administered prn for sleep. R: Patient remains safe on the unit.  Patient attended group tonight.  Patient cooperative and taking administered medications.  Patient visible on the unit and interacting with peers.

## 2013-05-13 NOTE — Progress Notes (Signed)
Psychoeducational Group Note  Date:  05/13/2013 Time:  2000  Group Topic/Focus:  NA groups  Participation Level: Did Not Attend  Participation Quality:  Not Applicable  Affect:  Not Applicable  Cognitive:  Not Applicable  Insight:  Not Applicable  Engagement in Group: Not Applicable  Additional Comments:    Flonnie Hailstone 05/13/2013, 9:28 PM

## 2013-05-13 NOTE — Progress Notes (Signed)
D: Patient denies HI and A/V hallucinations and on and off thoughts of SI; patient reports sleep is well; reports appetite is good ; reports energy level is normal ; reports ability to pay attention is good; rates depression as 10/10; rates hopelessness 10/10;   A: Monitored q 15 minutes; patient encouraged to attend groups; patient educated about medications; patient given medications per physician orders; patient encouraged to express feelings and/or concerns  R: Patient was irritable and demanding; patient is bright and animated in the afternoon; patient is attention seeking and can be loud on the unit; patient is unwilling to participate; patient's interaction with staff and peers is appropriate at times but minimal; patient was able to set goal to talk with staff 1:1 when having feelings of SI; patient is taking medications as prescribed and tolerating medications; patient  attending some groups and not engaging and forwards little information to staff

## 2013-05-13 NOTE — Progress Notes (Signed)
[facility] MD Progress Note  05/13/2013 3:33 PM [name]  MRN:  [id number] Subjective: [name] claims he is still feeling very depressed, irritated, agitated, suicidal. States he has no support. He has no place to go, cant go back to the shelter. It happens that the day he came here was the last day he could have staid at the shelter Diagnosis:  Bipolar Disorder/Cannabis abuse  ADL's:  Intact  Sleep: Poor  Appetite:  Fair  Suicidal Ideation:  Plan:  denies Intent:  denies Means:  denies Homicidal Ideation:  Plan:  denies Intent:  denies Means:  denies AEB (as evidenced by):  Psychiatric Specialty Exam: Review of Systems  Constitutional: Negative.   HENT: Negative.   Eyes: Negative.   Respiratory: Negative.   Cardiovascular: Negative.   Gastrointestinal: Negative.   Genitourinary: Negative.   Musculoskeletal: Negative.   Skin: Negative.   Neurological: Negative.   Endo/Heme/Allergies: Negative.   Psychiatric/Behavioral: Positive for depression and suicidal ideas. The patient is nervous/anxious and has insomnia.     Blood pressure 120/77, pulse 71, temperature 97 F (36.1 C), temperature source Oral, resp. rate 16, height 5\' 5"  (1.651 m).There is no weight on file to calculate BMI.  General Appearance: Disheveled  Eye Solicitor::  Fair  Speech:  Clear and Coherent and Slow  Volume:  fluctuates  Mood:  Angry, Anxious, Depressed and Irritable  Affect:  Labile  Thought Process:  Coherent and Goal Directed  Orientation:  Full (Time, Place, and Person)  Thought Content:  worries, concerns,fearof losing control  Suicidal Thoughts:  Yes.  without intent/plan  Homicidal Thoughts:  No  Memory:  Immediate;   Fair Recent;   Fair Remote;   Fair  Judgement:  Fair  Insight:  superficial  Psychomotor Activity:  Restlessness  Concentration:  Fair  Recall:  Fair  Akathisia:  No  Handed:  Right  AIMS (if indicated):     Assets:  Desire for Improvement  Sleep:  Number of Hours: 6.5    Current Medications: Current Facility-Administered Medications  Medication Dose Route Frequency Provider Last Rate Last Dose  . acetaminophen (TYLENOL) tablet 650 mg  650 mg Oral Q4H PRN Court Joy, PA-C   650 mg at 05/09/13 1148  . alum & mag hydroxide-simeth (MAALOX/MYLANTA) 200-200-20 MG/5ML suspension 30 mL  30 mL Oral PRN Court Joy, PA-C      . benztropine (COGENTIN) tablet 0.5 mg  0.5 mg Oral BID Court Joy, PA-C   0.5 mg at 05/13/13 2130  . famotidine (PEPCID) tablet 20 mg  20 mg Oral BID Kerry Hough, PA-C   20 mg at 05/13/13 8657  . hydrocerin (EUCERIN) cream   Topical QID Verne Spurr, PA-C      . ibuprofen (ADVIL,MOTRIN) tablet 600 mg  600 mg Oral Q8H PRN Court Joy, PA-C   600 mg at 05/10/13 1845  . magnesium hydroxide (MILK OF MAGNESIA) suspension 30 mL  30 mL Oral Daily PRN Court Joy, PA-C      . ondansetron (ZOFRAN-ODT) disintegrating tablet 8 mg  8 mg Oral Q8H PRN Kerry Hough, PA-C   8 mg at 05/11/13 2124  . QUEtiapine (SEROQUEL) tablet 100 mg  100 mg Oral BID Rachael Fee, MD   100 mg at 05/13/13 8469  . QUEtiapine (SEROQUEL) tablet 50 mg  50 mg Oral Q1500 Rachael Fee, MD   50 mg at 05/13/13 1447  . traZODone (DESYREL) tablet 50 mg  50 mg Oral QHS  PRN,MR X 1 Court Joy, PA-C   50 mg at 05/12/13 2159    Lab Results: No results found for this or any previous visit (from the past 48 hour(s)).  Physical Findings: AIMS:  , ,  ,  ,    CIWA:    COWS:     Treatment Plan Summary: Daily contact with patient to assess and evaluate symptoms and progress in treatment Medication management  Plan: Supportive approach/coping skills/relapse prevention           Anger management           Increase the Seroquel to 100 mg TID  Medical Decision Making Problem Points:  Review of psycho-social stressors (1) Data Points:  Review of medication regiment & side effects (2)  I certify that inpatient services furnished can reasonably be expected  to improve the patient's condition.   Freada Twersky A 05/13/2013, 3:33 PM

## 2013-05-14 NOTE — Progress Notes (Signed)
[facility] MD Progress Note  05/14/2013 1:51 PM [name]  MRN:  [id number] Subjective:  Buren continues to have a hard time. He states that the increase in Seroquel seems to have helped some. Still feeling depressed, suicidal. He has a bed to go to [facility] the 30 th. But does not have anywhere to stay until then. He claims he does not think he is going to be able to make it until then if he was out there. Continues to say there is no family or friends that can help him out. States he feels more down depressed when he thinks about his situation Diagnosis:  Bipolar Depression/Cannabit Abuse R/O Dependence  ADL's:  Intact  Sleep: Poor  Appetite:  Fair  Suicidal Ideation:  Plan:  denies Intent:  denies Means:  denies Homicidal Ideation:  Plan:  denies Intent:  denies Means:  denies AEB (as evidenced by):  Psychiatric Specialty Exam: Review of Systems  Constitutional: Negative.   HENT: Negative.   Eyes: Negative.   Respiratory: Negative.   Cardiovascular: Negative.   Gastrointestinal: Negative.   Genitourinary: Negative.   Musculoskeletal: Negative.   Skin: Negative.   Neurological: Negative.   Endo/Heme/Allergies: Negative.   Psychiatric/Behavioral: Positive for suicidal ideas and substance abuse. The patient is nervous/anxious and has insomnia.     Blood pressure 113/69, pulse 87, temperature 98 F (36.7 C), temperature source Oral, resp. rate 16, height 5\' 5"  (1.651 m).There is no weight on file to calculate BMI.  General Appearance: Disheveled  Eye Solicitor::  Fair  Speech:  Clear and Coherent, Slow and not spontaneous  Volume:  fluctuates  Mood:  Anxious, Depressed and worried  Affect:  anxious, worried  Thought Process:  Coherent and Goal Directed  Orientation:  Full (Time, Place, and Person)  Thought Content:  worries, concerns, ruminations  Suicidal Thoughts:  Yes.  without intent/plan  Homicidal Thoughts:  No  Memory:  Immediate;   Fair Recent;   Fair Remote;    Fair  Judgement:  Fair  Insight:  Shallow and superficial  Psychomotor Activity:  Restlessness  Concentration:  Fair  Recall:  Fair  Akathisia:  No  Handed:  Right  AIMS (if indicated):     Assets:  Desire for Improvement  Sleep:  Number of Hours: 6   Current Medications: Current Facility-Administered Medications  Medication Dose Route Frequency Provider Last Rate Last Dose  . acetaminophen (TYLENOL) tablet 650 mg  650 mg Oral Q4H PRN Court Joy, PA-C   650 mg at 05/09/13 1148  . alum & mag hydroxide-simeth (MAALOX/MYLANTA) 200-200-20 MG/5ML suspension 30 mL  30 mL Oral PRN Court Joy, PA-C      . benztropine (COGENTIN) tablet 0.5 mg  0.5 mg Oral BID Court Joy, PA-C   0.5 mg at 05/14/13 0802  . famotidine (PEPCID) tablet 20 mg  20 mg Oral BID Kerry Hough, PA-C   20 mg at 05/14/13 0802  . hydrocerin (EUCERIN) cream   Topical QID Verne Spurr, PA-C      . ibuprofen (ADVIL,MOTRIN) tablet 600 mg  600 mg Oral Q8H PRN Court Joy, PA-C   600 mg at 05/10/13 1845  . magnesium hydroxide (MILK OF MAGNESIA) suspension 30 mL  30 mL Oral Daily PRN Court Joy, PA-C      . ondansetron (ZOFRAN-ODT) disintegrating tablet 8 mg  8 mg Oral Q8H PRN Kerry Hough, PA-C   8 mg at 05/11/13 2124  . QUEtiapine (SEROQUEL) tablet 100 mg  100 mg Oral BID Rachael Fee, MD   100 mg at 05/14/13 0802  . QUEtiapine (SEROQUEL) tablet 50 mg  50 mg Oral Q1500 Rachael Fee, MD   50 mg at 05/13/13 1447  . traZODone (DESYREL) tablet 50 mg  50 mg Oral QHS PRN,MR X 1 Court Joy, PA-C   50 mg at 05/13/13 2131    Lab Results: No results found for this or any previous visit (from the past 48 hour(s)).  Physical Findings: AIMS:  , ,  ,  ,    CIWA:    COWS:     Treatment Plan Summary: Daily contact with patient to assess and evaluate symptoms and progress in treatment Medication management  Plan: Supportive approach/coping skills/relapse prevention           Continue to optimize  treatment with Seroquel           Explore other treatment options  Medical Decision Making Problem Points:  Review of psycho-social stressors (1) Data Points:  Review of medication regiment & side effects (2)  I certify that inpatient services furnished can reasonably be expected to improve the patient's condition.   Pachia Strum A 05/14/2013, 1:51 PM

## 2013-05-14 NOTE — Progress Notes (Signed)
Recreation Therapy Notes  Date: 07.24.2014 Time: 3:00pm Location: [address] Dayroom  Group Topic: Leisure Education  Goal Area(s) Addresses:  Patient will have increased knowledge of community resources. Patient will verbalize the ability to use positive leisure/recreation as a coping mechanism. Patient will verbalized a benefit of implementing positive leisure/recreation into daily life.   Behavioral Response: Resistant, Sharing  Intervention: Art, Self-Expression  Activity: Match box of Leisure. Patients were given small white boxes similar in construction to [employer]. Patients were instructed to decorate the outside to represent themselves and used magazine clippings to represent three activities of choice that can be used as coping mechanisms. Patients were given boxes, magazines, glue, scissors, color pencils and crayons to complete activity.   Education:  Discharge Planning, Pharmacologist, Leisure Education   Education Outcome: Needs Additional Education  Clinical Observations: Patient verbalized knowledge of community resources such as the [employer], parks, Museum/gallery exhibitions officer, and [employer]. Patient verbalized she has an understanding of role of individual organization. Patient refused to participate in group activity, but remained in session and shared personal stories with group. Patient refused group activity on premise that he is not "in kindergarten." Patient was only able to see superficial aspects of activity, such as coloring. Patient lacked the insight to find therapeutic benefit to activity. Patient shared his love of basketball with the group. Patient identified all positive emotions associated with playing basketball, such as happy, elation, joy and competitive. Patient additionally shared that he has difficulty attending sporting events as a spectator because it is easy access to alcohol.   [name], LRT/CTRS  [name] 05/14/2013 4:43 PM

## 2013-05-14 NOTE — Progress Notes (Signed)
D:  Patient spent most of the day in his room.  Did attend some groups later in the day.  Denies suicidal ideation.  Did not turn in his self inventory form.  States his wife does not want him to go to long term rehab, but he wants to go anyway.   A:  Medications given as prescribed.  Encouraged participation in groups.    R:  Safety is maintained.  Interacting well with peers, but a bit irritable at times.

## 2013-05-14 NOTE — Progress Notes (Signed)
The focus of this group is to educate the patient on the purpose and policies of crisis stabilization and provide a format to answer questions about their admission.  The group details unit policies and expectations of patients while admitted. Did not attend.  

## 2013-05-14 NOTE — Progress Notes (Signed)
Patient did attend the evening karaoke group. Pt was engaged and supportive.   

## 2013-05-14 NOTE — [facility] Group Notes (Signed)
[facility] LCSW Group Therapy  05/14/2013 2:50 PM  Type of Therapy:  Group Therapy  Participation Level:  Active  Participation Quality:  Attentive  Affect:  Appropriate  Cognitive:  Oriented  Insight:  Improving  Engagement in Therapy:  Improving  Modes of Intervention:  Confrontation, Discussion, Education, Exploration, Socialization and Support  Summary of Progress/Problems:  Finding Balance in Life. Today's group focused on defining balance in one's own words, identifying things that can knock one off balance, and exploring healthy ways to maintain balance in life. Group members were asked to provide an example of a time when they felt off balance, describe how they handled that situation,and process healthier ways to regain balance in the future. Group members were asked to share the most important tool for maintaining balance that they learned while at [facility] and how they plan to apply this method after discharge. Reggie stated that he feels off balance when he "smells marijuana" and when he is "taken away from my daughter." Omid stated that he noticed he had been sleeping a lot, not as active as he used to be, and is at risk for losing partial custody of his daughter if he continues down his path of "self destruction." Shavar shows progress in the group setting AEB his ability to openly share these problems and issues that throw him off balance, tolerate confrontation from group members, and identify ways to apply coping skills to his life in order to achieve and maintain a sense of balance. Rayden stated that he plans to go to [facility] in order to get control of his substance abuse problem and plans to make more of an effort to "do for myself" in terms of making aftercare plans and following up with mental health providers.    Smart, Anica Alcaraz 05/14/2013, 2:50 PM

## 2013-05-15 DIAGNOSIS — F1910 Other psychoactive substance abuse, uncomplicated: Secondary | ICD-10-CM

## 2013-05-15 MED ORDER — QUETIAPINE FUMARATE 50 MG PO TABS: ORAL_TABLET | ORAL | Status: DC

## 2013-05-15 MED ORDER — TRAZODONE HCL 50 MG PO TABS: 50.0000 mg | ORAL_TABLET | Freq: Every evening | ORAL | Status: DC | PRN

## 2013-05-15 MED ORDER — BENZTROPINE MESYLATE 0.5 MG PO TABS: 0.5000 mg | ORAL_TABLET | Freq: Two times a day (BID) | ORAL | Status: DC

## 2013-05-15 NOTE — Progress Notes (Signed)
D   Pt is pleasant and cooperative  He attended and participated in groups   He denies suicidal and homicidal ideation  He is logical and coherent in his thinking  He denies any withdrawal symptoms  He has minimal insight into his illness A   Verbal support given  Medications administered and effectiveness monitored   Q 15 min checks R   Pt safe at present

## 2013-05-15 NOTE — Progress Notes (Signed)
Nursing Discharge Note. Patient presents with euthymic mood, affect appropriate. Pt denies any SI/HI / AV Hallucinations at this time. No signs of acute decompensation. He states ''I figured I'd be getting released today'' Orders received for patients discharge, pt states ''yeah I'm ready to go'' . Discharge instructions reviewed and follow up care and plan provided. AVS reviewed and signed by patient and Clinical research associate.Opportunity for questions provided. Rx given along with 7 day free medication supply with medication information handouts. All belongings returned. Pt escorted from unit with Clinical research associate to lobby with [employer] staff.

## 2013-05-15 NOTE — [facility] Suicide Risk Assessment (Signed)
Suicide Risk Assessment  Discharge Assessment     Demographic Factors:  Male, Adolescent or young adult, Living alone and Unemployed  Mental Status Per Nursing Assessment::   On Admission:  Suicidal ideation indicated by patient  Current Mental Status by Physician: In full contact with reality. There are no suicidal ideas, plans or intent. His mood is euthymic, his affect is appropriate. He is willing to pursue outpatient treatement and go to a long term rehab program. He will go to the [facility] and follow up with [facility]   Loss Factors: NA  Historical Factors: NA  Risk Reduction Factors:   wants to do better, resourceful  Continued Clinical Symptoms:  Bipolar Disorder:   Depressive phase Alcohol/Substance Abuse/Dependencies  Cognitive Features That Contribute To Risk:  Closed-mindedness Polarized thinking Thought constriction (tunnel vision)    Suicide Risk:  Minimal: No identifiable suicidal ideation.  Patients presenting with no risk factors but with morbid ruminations; may be classified as minimal risk based on the severity of the depressive symptoms  Discharge Diagnoses:   AXIS I:  Mood Disorder NOS, Cannabis Abuse AXIS II:  Deferred AXIS III:   Past Medical History  Diagnosis Date  . Asthma   . Bipolar depression   . Schizophrenia   . Suicide attempt    AXIS IV:  housing problems, occupational problems and problems with primary support group AXIS V:  61-70 mild symptoms  Plan Of Care/Follow-up recommendations:  Activity:  as tolerated Diet:  regular Follow up outpatient basis/[facility] Will go to the [facility] today Is patient on multiple antipsychotic therapies at discharge:  No   Has Patient had three or more failed trials of antipsychotic monotherapy by history:  No  Recommended Plan for Multiple Antipsychotic Therapies: N/A   Mehar Sagen A 05/15/2013, 1:07 PM

## 2013-05-15 NOTE — Progress Notes (Signed)
[facility] Adult Case Management Discharge Plan :  Will you be returning to the same living situation after discharge: No. At discharge, do you have transportation home?:Yes,  [facility] Do you have the ability to pay for your medications:Yes,  medicaid  Release of information consent forms completed and in the chart;  Patient's signature needed at discharge.  Patient to Follow up at: Follow-up Information   Follow up with [facility]. (Walk in Monday through Friday between 8AM-9AM for hospital followup.)    Contact information:   [address], [place] [address] phone: [phone] fax: [phone]      Follow up with [facility] On 05/15/2013. (Driver from [employer] will pick you up at 2:00PM today. )    Contact information:   [address] [place], [place] [address] phone: [phone] fax: [phone]      Patient denies SI/HI:   Yes,  self report/in group    Safety Planning and Suicide Prevention discussed:  Yes,  pt refused to consent to family contact. SPE completed with pt and he was encouraged to ask questions/talk about concerns relating to SPE.  Smart, Trigo Winterbottom 05/15/2013, 11:03 AM

## 2013-05-15 NOTE — [facility] Group Notes (Signed)
[facility] LCSW Aftercare Discharge Planning Group Note   05/15/2013 9:33 AM  Participation Quality:  DID NOT ATTEND    Smart, Cypress Hinkson   

## 2013-05-15 NOTE — Discharge Summary (Signed)
Physician Discharge Summary Note  Patient:  [name] is an 22 y.o., male MRN:  [id number] DOB:  10/11/1991 Patient phone:  [phone] (home)  Patient address:   [address] [place] [address],   Date of Admission:  05/09/2013 Date of Discharge: 05/15/13  Reason for Admission:  Bipolar depression, substance abuse  Discharge Diagnoses: Active Problems:   * No active hospital problems. *  Review of Systems  Constitutional: Negative.   HENT: Negative.   Eyes: Negative.   Respiratory: Negative.   Cardiovascular: Negative.   Gastrointestinal: Negative.   Genitourinary: Negative.   Musculoskeletal: Negative.   Skin: Negative.   Neurological: Negative.   Endo/Heme/Allergies: Negative.   Psychiatric/Behavioral: Positive for suicidal ideas (Stabilized with medication prior to discharge) and substance abuse. Negative for hallucinations and memory loss. The patient is nervous/anxious (Stabilized with medication prior to discharge) and has insomnia (Stabilized with medication prior to discharge).    Axis Diagnosis:   AXIS I:  Bipolar depression, Schizophrenia, Cannabis abuse AXIS II:  Deferred AXIS III:   Past Medical History  Diagnosis Date  . Asthma   . Bipolar depression   . Schizophrenia   . Suicide attempt    AXIS IV:  other psychosocial or environmental problems and Chronic mental illness, substance abuse AXIS V:  62  Level of Care:  [facility]  Hospital Course:  Everything went down hill again. Depression, panic attacks. Was here few months ago and left feeling better. Went to stay with a friend. This friend got him into stealing AC units. Got locked up until June 12. He is out on probation, hopeless. He does side hustles, some work to get money to get the Marijuana. While at the shelter he felt overwhelmed was planning to kill self, tried to hang himself and was stopped.  While a patient in this hospital and after admission assessment/evaluation, it was determined  based on patient's symptoms that his toxicology/UDS reports indicated no presence of alcohol and or any drugs in her systems other than marijuana. Although feeling depressed and upset, patient was not presenting any withdrawal symptoms of alcohol and or any other substances. As a result, he received no detoxification treatment protocol. However, it was determined that he will need medication management to stabilize his current depressive mood symptoms as he has a history of bipolar disorder. He was then ordered and received Seroquel 50 mg daily and 100 mg bid for mood control, Benztropine 0.5 mg bid for prevention of EPS and Trazodone 50 mg Q bedtime for sleep. He also was enrolled in group counseling sessions and activities to be counseled, taught and possibly learn coping skills that should help him cope better and manage his symptoms effectively after discharge. However, Mr. Derden seldom participated in either group counseling sessions and or AA/NA meetings being held on this unit. He did receive medication management and monitoring for his other previously existing medical issues and concerns that he presented such as acid reflux. He tolerated his treatment regimen without any significant adverse effects and or reactions presented.   Patient did finally respond positively to his treatment regimen. This is evidenced by his daily gradual reports of improved mood, reduction of symptoms and presentation of good affect/eye contact. He attended treatment team meeting this am and met with his treatment team members. His reason for admission, present symptoms, response to treatment and discharge plans discussed with patient. Taimur endorsed that his symptoms has stabilized and that he is ready for discharge to pursue psychiatric care  on outpatient basis. It was then agreed upon that patient will be [facility] where he [facility] reside and continue substance abuse care. And for routine  psychiatric care and medication management, he will be seen at [facility] here in [place], [place]. He has been instructed and informed that the appointment at [facility] is a walk-in appointment between the hours of 08:00 and 09:00 am. The addresses, dates, times and contact information for the [facility] and [facility] provided for patient in writing.  Upon discharge, Mr. Humann adamantly denies any suicidal, homicidal ideations, auditory, visual hallucinations, paranoia and or delusional thoughts. He was provided with 4 days worth supply samples of his [facility] discharge medications. He left [facility] with all personal belongings in no apparent distress. Transportation per staff of the [employer].  Consults:  psychiatry  Significant Diagnostic Studies:  labs: CBC with diff, CMP, UDS, Toxicology tests, U/A  Discharge Vitals:   Blood pressure 115/72, pulse 105, temperature 97.6 F (36.4 C), temperature source Oral, resp. rate 18, height 5\' 5"  (1.651 m). There is no weight on file to calculate BMI. Lab Results:   No results found for this or any previous visit (from the past 72 hour(s)).  Physical Findings: AIMS: Facial and Oral Movements Muscles of Facial Expression: None, normal Lips and Perioral Area: None, normal Jaw: None, normal Tongue: None, normal,Extremity Movements Upper (arms, wrists, hands, fingers): None, normal Lower (legs, knees, ankles, toes): None, normal, Trunk Movements Neck, shoulders, hips: None, normal, Overall Severity Severity of abnormal movements (highest score from questions above): None, normal Incapacitation due to abnormal movements: None, normal Patient's awareness of abnormal movements (rate only patient's report): No Awareness, Dental Status Current problems with teeth and/or dentures?: No Does patient usually wear dentures?: No  CIWA:    COWS:     Psychiatric Specialty Exam: See Psychiatric Specialty Exam and Suicide Risk Assessment  completed by Attending Physician prior to discharge.  Discharge destination:  [facility]  Is patient on multiple antipsychotic therapies at discharge:  No   Has Patient had three or more failed trials of antipsychotic monotherapy by history:  No  Recommended Plan for Multiple Antipsychotic Therapies: NA     Medication List    STOP taking these medications       risperiDONE 0.25 MG tablet  Commonly known as:  RISPERDAL      TAKE these medications     Indication   benztropine 0.5 MG tablet  Commonly known as:  COGENTIN  Take 1 tablet (0.5 mg total) by mouth 2 (two) times daily. For prevention of involuntary movements   Indication:  Extrapyramidal Reaction caused by Medications     QUEtiapine 50 MG tablet  Commonly known as:  SEROQUEL  Take 1 tablet (50 mg) daily at 3:00 PM and 2 tablets (100 mg ) twice daily: For mood control   Indication:  Depressive Phase of Manic-Depression, Trouble Sleeping, Manic Phase of Manic-Depression, Schizophrenia     traZODone 50 MG tablet  Commonly known as:  DESYREL  Take 1 tablet (50 mg total) by mouth at bedtime as needed and may repeat dose one time if needed for sleep. For sleep   Indication:  Trouble Sleeping       Follow-up Information   Follow up with [facility]. (Walk in Monday through Friday between 8AM-9AM for hospital followup.)    Contact information:   [address], [place] [address] phone: [phone] fax: [phone]  Follow up with [facility] On 05/15/2013. (Driver from [employer] will pick you up at 2:00PM today. )    Contact information:   [address] [place], [place] [address] phone: [phone] fax: [phone]     Follow-up recommendations: Activity:  As tolerated Diet: As recommended by your primary care doctor. Keep all scheduled follow-up appointments as recommended.  Continue to work your relapse prevention plan Comments: Take all your medications as prescribed by your mental healthcare  provider. Report any adverse effects and or reactions from your medicines to your outpatient provider promptly. Patient is instructed and cautioned to not engage in alcohol and or illegal drug use while on prescription medicines. In the event of worsening symptoms, patient is instructed to call the crisis hotline, 911 and or go to the nearest ED for appropriate evaluation and treatment of symptoms. Follow-up with your primary care provider for your other medical issues, concerns and or health care needs.   Total Discharge Time:  Greater than 30 minutes.  Signed: Sanjuana Kava, PMHNP, FNP 05/15/2013, 12:40 PM Agree with assessment and plan Reymundo Poll. Dub Mikes, M.D.

## 2013-05-15 NOTE — Tx Team (Signed)
Interdisciplinary Treatment Plan Update (Adult)  Date: 05/15/2013   Time Reviewed: 10:02 AM  Progress in Treatment:  Attending groups: Intermittently Participating in groups:  Minimally Taking medication as prescribed: Yes  Tolerating medication: Yes  Family/Significant othe contact made: Pt refused SPE with family member. SPE completed with pt.  Patient understands diagnosis: Yes, AEB seeking treatment for SI, depression, bipolar disorder, and cannabis abuse. Discussing patient identified problems/goals with staff: Yes  Medical problems stabilized or resolved: Yes  Denies suicidal/homicidal ideation: Yes, in group.self report.   Patient has not harmed self or Others: Yes  New problem(s) identified:  Discharge Plan or Barriers: Pt will be picked up by [facility] driver and will live at this recovery house. He will follow up at [facility] for med management.  Additional comments: n/a Reason for Continuation of Hospitalization: none. D/c today Estimated length of stay: d/c today. For review of initial/current patient goals, please see plan of care.  Attendees:  Patient:    Family:    Physician: Geoffery Lyons MD 05/15/2013 10:02 AM   Nursing: Mardella Layman RN  05/15/2013 10:02 AM   Clinical Social Worker Jadarius Commons Smart, LCSWA  05/15/2013 10:02 AM   Other: Sue Lush RN 05/15/2013 10:02 AM   Other: Aggie PA 05/15/2013 10:02 AM   Other: Massie Kluver, Community Care Coordinator  05/15/2013 10:02 AM   Other: Darden Dates Nurse CM 05/15/2013 10:02 AM   Scribe for Treatment Team:  [employer] LCSWA 05/15/2013 10:02 AM

## 2013-05-20 NOTE — Progress Notes (Signed)
Patient Discharge Instructions:  After Visit Summary (AVS):   Faxed to:  05/21/13 Discharge Summary Note:   Faxed to:  05/21/13 Psychiatric Admission Assessment Note:   Faxed to:  05/21/13 Suicide Risk Assessment - Discharge Assessment:   Faxed to:  05/21/13 Faxed/Sent to the Next Level Care provider:  05/21/13 Faxed to [facility] @ [phone] Faxed to [facility] @ [phone]  Jerelene Redden, 05/20/2013, 3:49 PM

## 2014-01-03 ENCOUNTER — Encounter (HOSPITAL_COMMUNITY): Payer: Self-pay | Admitting: Emergency Medicine

## 2014-01-03 ENCOUNTER — Emergency Department (HOSPITAL_COMMUNITY)
Admission: EM | Admit: 2014-01-03 | Discharge: 2014-01-03 | Disposition: A | Discharge status: Home / Self Care | Payer: Medicaid Other | Attending: Emergency Medicine | Admitting: Emergency Medicine

## 2014-01-03 DIAGNOSIS — Z79899 Other long term (current) drug therapy: Secondary | ICD-10-CM | POA: Insufficient documentation

## 2014-01-03 DIAGNOSIS — J45909 Unspecified asthma, uncomplicated: Secondary | ICD-10-CM | POA: Insufficient documentation

## 2014-01-03 DIAGNOSIS — K130 Diseases of lips: Secondary | ICD-10-CM | POA: Insufficient documentation

## 2014-01-03 DIAGNOSIS — F17200 Nicotine dependence, unspecified, uncomplicated: Secondary | ICD-10-CM | POA: Insufficient documentation

## 2014-01-03 DIAGNOSIS — F3130 Bipolar disorder, current episode depressed, mild or moderate severity, unspecified: Secondary | ICD-10-CM | POA: Insufficient documentation

## 2014-01-03 DIAGNOSIS — F209 Schizophrenia, unspecified: Secondary | ICD-10-CM | POA: Insufficient documentation

## 2014-01-03 MED ORDER — HYDROCODONE-ACETAMINOPHEN 5-325 MG PO TABS: 1.0000 | ORAL_TABLET | ORAL | Status: DC | PRN

## 2014-01-03 MED ORDER — HYDROCODONE-ACETAMINOPHEN 5-325 MG PO TABS
1.0000 | ORAL_TABLET | Freq: Once | ORAL | Status: AC
  Administered 2014-01-03: 1 via ORAL
  Filled 2014-01-03: qty 1

## 2014-01-03 MED ORDER — SULFAMETHOXAZOLE-TRIMETHOPRIM 800-160 MG PO TABS: 1.0000 | ORAL_TABLET | Freq: Two times a day (BID) | ORAL | Status: DC

## 2014-01-03 MED ORDER — SULFAMETHOXAZOLE-TMP DS 800-160 MG PO TABS
1.0000 | ORAL_TABLET | Freq: Once | ORAL | Status: AC
  Administered 2014-01-03: 1 via ORAL
  Filled 2014-01-03: qty 1

## 2014-01-03 MED ORDER — CEPHALEXIN 250 MG PO CAPS
500.0000 mg | ORAL_CAPSULE | Freq: Once | ORAL | Status: AC
  Administered 2014-01-03: 500 mg via ORAL
  Filled 2014-01-03: qty 2

## 2014-01-03 MED ORDER — CEPHALEXIN 500 MG PO CAPS: 500.0000 mg | ORAL_CAPSULE | Freq: Four times a day (QID) | ORAL | Status: DC

## 2014-01-03 NOTE — Discharge Instructions (Signed)
You were seen and evaluated for your increasing lip swelling and pain. At this time your providers feel that you have a deep skin infection causing your symptoms. Please use the antibiotics prescribed as instructed full-length of time. Use warm compress over the lip and infected area. Have a recheck of your infection in 2-3 days. Return sooner if you have any changing or worsening in symptoms.      Abscess An abscess is an infected area that contains a collection of pus and debris.It can occur in almost any part of the body. An abscess is also known as a furuncle or boil. CAUSES  An abscess occurs when tissue gets infected. This can occur from blockage of oil or sweat glands, infection of hair follicles, or a minor injury to the skin. As the body tries to fight the infection, pus collects in the area and creates pressure under the skin. This pressure causes pain. People with weakened immune systems have difficulty fighting infections and get certain abscesses more often.  SYMPTOMS Usually an abscess develops on the skin and becomes a painful mass that is red, warm, and tender. If the abscess forms under the skin, you may feel a moveable soft area under the skin. Some abscesses break open (rupture) on their own, but most will continue to get worse without care. The infection can spread deeper into the body and eventually into the bloodstream, causing you to feel ill.  DIAGNOSIS  Your caregiver will take your medical history and perform a physical exam. A sample of fluid may also be taken from the abscess to determine what is causing your infection. TREATMENT  Your caregiver may prescribe antibiotic medicines to fight the infection. However, taking antibiotics alone usually does not cure an abscess. Your caregiver may need to make a small cut (incision) in the abscess to drain the pus. In some cases, gauze is packed into the abscess to reduce pain and to continue draining the area. HOME CARE  INSTRUCTIONS   Only take over-the-counter or prescription medicines for pain, discomfort, or fever as directed by your caregiver.  If you were prescribed antibiotics, take them as directed. Finish them even if you start to feel better.  If gauze is used, follow your caregiver's directions for changing the gauze.  To avoid spreading the infection:  Keep your draining abscess covered with a bandage.  Wash your hands well.  Do not share personal care items, towels, or whirlpools with others.  Avoid skin contact with others.  Keep your skin and clothes clean around the abscess.  Keep all follow-up appointments as directed by your caregiver. SEEK MEDICAL CARE IF:   You have increased pain, swelling, redness, fluid drainage, or bleeding.  You have muscle aches, chills, or a general ill feeling.  You have a fever. MAKE SURE YOU:   Understand these instructions.  Will watch your condition.  Will get help right away if you are not doing well or get worse. Document Released: 07/18/2005 Document Revised: 04/08/2012 Document Reviewed: 12/21/2011 [facility] Patient Information 2014 [place], [place].

## 2014-01-03 NOTE — ED Notes (Signed)
The pt has a red swollen upper lip after he squeezed a pimple.  It started 3 days ago.  He has had a temp

## 2014-01-03 NOTE — ED Provider Notes (Signed)
CSN: [id number]     Arrival date & time 01/03/14  2239 History   First MD Initiated Contact with Patient 01/03/14 2259     Chief Complaint  Patient presents with  . lip lesion    HPI  History provided by the patient and family. Patient is a 23 year old male with history of bipolar disorder, schizophrenia and asthma who presents with complaints of worsening upper lip pain and swelling. Patient states that he was trying to squeeze the pimple 3 days ago. Since that time he has had increased redness and pain to his upper lip. He has not used any other treatments since that time. He does also complain of some subjective fevers and chills at times. Denies any recent cough or cold symptoms. No nausea vomiting or diarrhea. Denies any other swelling within the mouth. No difficulty breathing or swallowing. No other aggravating or alleviating factors. No other associated symptoms    Past Medical History  Diagnosis Date  . Asthma   . Bipolar depression   . Schizophrenia   . Suicide attempt    Past Surgical History  Procedure Laterality Date  . No past surgeries     No family history on file. History  Substance Use Topics  . Smoking status: Current Every Day Smoker -- 1.50 packs/day for 13 years    Types: Cigarettes  . Smokeless tobacco: Not on file  . Alcohol Use: No    Review of Systems  Constitutional: Positive for fever and chills.  HENT: Negative for congestion, dental problem, rhinorrhea and sore throat.   Respiratory: Negative for cough.   Gastrointestinal: Negative for vomiting and diarrhea.  All other systems reviewed and are negative.      Allergies  Review of patient's allergies indicates no known allergies.  Home Medications   Current Outpatient Rx  Name  Route  Sig  Dispense  Refill  . benztropine (COGENTIN) 0.5 MG tablet   Oral   Take 1 tablet (0.5 mg total) by mouth 2 (two) times daily. For prevention of involuntary movements   60 tablet   0   . QUEtiapine  (SEROQUEL) 50 MG tablet      Take 1 tablet (50 mg) daily at 3:00 PM and 2 tablets (100 mg ) twice daily: For mood control   150 tablet   0   . traZODone (DESYREL) 50 MG tablet   Oral   Take 1 tablet (50 mg total) by mouth at bedtime as needed and may repeat dose one time if needed for sleep. For sleep   60 tablet   0    BP 149/67  Pulse 60  Temp(Src) 99.2 F (37.3 C) (Oral)  Resp 16  Wt 195 lb 3 oz (88.536 kg)  SpO2 99% Physical Exam  Nursing note and vitals reviewed. Constitutional: He is oriented to person, place, and time. He appears well-developed and well-nourished.  HENT:  Head: Normocephalic.  Mouth/Throat: Oropharynx is clear and moist.  Cardiovascular: Normal rate and regular rhythm.   Pulmonary/Chest: Effort normal and breath sounds normal. No respiratory distress. He has no wheezes. He has no rales.  Musculoskeletal: Normal range of motion.  Neurological: He is alert and oriented to person, place, and time.  Skin: Skin is warm.  There is swelling to the middle of the upper lipid with scabbing to the outer skin. No bleeding or drainage. Area is tender with induration. No swelling into the gums. Normal dentition without signs of dental abscess.  Psychiatric: He has a normal  mood and affect.    ED Course  Procedures   DIAGNOSTIC STUDIES: Oxygen Saturation is 99% on RA.    COORDINATION OF CARE:  Nursing notes reviewed. Vital signs reviewed. Initial pt interview and examination performed.   11:06 PM-patient seen and evaluated. Symptoms consistent with small abscess possible cellulitis to the upper lipid. At this time area is less than 2 cm. I doubt to be significant benefit with I&D at this time. Given the location on the face which would also like to prevent scarring. Will place patient on antibiotics and instructed to use warm compresses. He has been instructed to have a recheck in 2-3 days at which time we did discuss the possibility she may require an I&D.  Strict return precautions also provided for a sooner return.  Pt and family agrees with plan.   Treatment plan initiated: Medications  sulfamethoxazole-trimethoprim (BACTRIM DS) 800-160 MG per tablet 1 tablet (not administered)  HYDROcodone-acetaminophen (NORCO/VICODIN) 5-325 MG per tablet 1 tablet (not administered)  cephALEXin (KEFLEX) capsule 500 mg (not administered)       MDM   Final diagnoses:  Abscess of lip       Taje Sellereter S Stalin Gruenberg, PA-C 01/03/14 2318

## 2014-01-03 NOTE — ED Provider Notes (Signed)
Medical screening examination/treatment/procedure(s) were performed by non-physician practitioner and as supervising physician I was immediately available for consultation/collaboration.   EKG Interpretation None        Charles B. Sheldon, MD 01/03/14 2350 

## 2014-01-04 ENCOUNTER — Encounter (HOSPITAL_COMMUNITY): Payer: Self-pay | Admitting: Emergency Medicine

## 2014-01-04 DIAGNOSIS — J45909 Unspecified asthma, uncomplicated: Secondary | ICD-10-CM | POA: Insufficient documentation

## 2014-01-04 DIAGNOSIS — L98499 Non-pressure chronic ulcer of skin of other sites with unspecified severity: Secondary | ICD-10-CM | POA: Insufficient documentation

## 2014-01-04 LAB — CBC WITH DIFFERENTIAL/PLATELET
BASOS ABS: 0 10*3/uL (ref 0.0–0.1)
BASOS PCT: 0 % (ref 0–1)
EOS ABS: 0.1 10*3/uL (ref 0.0–0.7)
Eosinophils Relative: 1 % (ref 0–5)
HCT: 41.2 % (ref 39.0–52.0)
Hemoglobin: 14.2 g/dL (ref 13.0–17.0)
Lymphocytes Relative: 20 % (ref 12–46)
Lymphs Abs: 1.9 10*3/uL (ref 0.7–4.0)
MCH: 29.8 pg (ref 26.0–34.0)
MCHC: 34.5 g/dL (ref 30.0–36.0)
MCV: 86.4 fL (ref 78.0–100.0)
Monocytes Absolute: 0.9 10*3/uL (ref 0.1–1.0)
Monocytes Relative: 10 % (ref 3–12)
NEUTROS PCT: 70 % (ref 43–77)
Neutro Abs: 6.8 10*3/uL (ref 1.7–7.7)
PLATELETS: 208 10*3/uL (ref 150–400)
RBC: 4.77 MIL/uL (ref 4.22–5.81)
RDW: 12.4 % (ref 11.5–15.5)
WBC: 9.7 10*3/uL (ref 4.0–10.5)

## 2014-01-04 LAB — I-STAT CHEM 8, ED
BUN: 8 mg/dL (ref 6–23)
Calcium, Ion: 1.13 mmol/L (ref 1.12–1.23)
Chloride: 101 mEq/L (ref 96–112)
Creatinine, Ser: 0.8 mg/dL (ref 0.50–1.35)
Glucose, Bld: 97 mg/dL (ref 70–99)
HEMATOCRIT: 46 % (ref 39.0–52.0)
HEMOGLOBIN: 15.6 g/dL (ref 13.0–17.0)
POTASSIUM: 4.3 meq/L (ref 3.7–5.3)
SODIUM: 139 meq/L (ref 137–147)
TCO2: 27 mmol/L (ref 0–100)

## 2014-01-04 LAB — I-STAT CG4 LACTIC ACID, ED: LACTIC ACID, VENOUS: 0.97 mmol/L (ref 0.5–2.2)

## 2014-01-04 NOTE — ED Notes (Signed)
Patient with lip swelling, redness and pain.  Patient is unable to talk due to pain.  Patient has a scab on upper lip under nose.

## 2014-01-05 ENCOUNTER — Emergency Department (HOSPITAL_COMMUNITY)
Admission: EM | Admit: 2014-01-05 | Discharge: 2014-01-05 | Discharge status: Against Medical Advice | Payer: Medicaid Other | Attending: Emergency Medicine | Admitting: Emergency Medicine

## 2014-08-28 ENCOUNTER — Emergency Department (HOSPITAL_COMMUNITY)
Admission: EM | Admit: 2014-08-28 | Discharge: 2014-08-31 | Disposition: A | Discharge status: Other Acute Inpatient Hospital | Payer: Medicaid Other | Attending: Emergency Medicine | Admitting: Emergency Medicine

## 2014-08-28 ENCOUNTER — Encounter (HOSPITAL_COMMUNITY): Payer: Self-pay | Admitting: Emergency Medicine

## 2014-08-28 DIAGNOSIS — Z720 Tobacco use: Secondary | ICD-10-CM | POA: Insufficient documentation

## 2014-08-28 DIAGNOSIS — R443 Hallucinations, unspecified: Secondary | ICD-10-CM | POA: Insufficient documentation

## 2014-08-28 DIAGNOSIS — J45909 Unspecified asthma, uncomplicated: Secondary | ICD-10-CM | POA: Diagnosis not present

## 2014-08-28 DIAGNOSIS — Z590 Homelessness: Secondary | ICD-10-CM | POA: Diagnosis not present

## 2014-08-28 DIAGNOSIS — Z8659 Personal history of other mental and behavioral disorders: Secondary | ICD-10-CM | POA: Insufficient documentation

## 2014-08-28 DIAGNOSIS — T1491XA Suicide attempt, initial encounter: Secondary | ICD-10-CM

## 2014-08-28 DIAGNOSIS — R45851 Suicidal ideations: Secondary | ICD-10-CM | POA: Diagnosis present

## 2014-08-28 HISTORY — DX: Homelessness unspecified: Z59.00

## 2014-08-28 HISTORY — DX: Personal history of self-harm: Z91.5

## 2014-08-28 HISTORY — DX: Homelessness: Z59.0

## 2014-08-28 HISTORY — DX: Personal history of suicidal behavior: Z91.51

## 2014-08-28 LAB — CBC WITH DIFFERENTIAL/PLATELET
BASOS ABS: 0 10*3/uL (ref 0.0–0.1)
BASOS PCT: 0 % (ref 0–1)
EOS PCT: 1 % (ref 0–5)
Eosinophils Absolute: 0 10*3/uL (ref 0.0–0.7)
HCT: 38.6 % — ABNORMAL LOW (ref 39.0–52.0)
HEMOGLOBIN: 12.8 g/dL — AB (ref 13.0–17.0)
LYMPHS PCT: 36 % (ref 12–46)
Lymphs Abs: 3.1 10*3/uL (ref 0.7–4.0)
MCH: 29.2 pg (ref 26.0–34.0)
MCHC: 33.2 g/dL (ref 30.0–36.0)
MCV: 87.9 fL (ref 78.0–100.0)
MONO ABS: 0.5 10*3/uL (ref 0.1–1.0)
Monocytes Relative: 6 % (ref 3–12)
Neutro Abs: 5 10*3/uL (ref 1.7–7.7)
Neutrophils Relative %: 57 % (ref 43–77)
PLATELETS: 215 10*3/uL (ref 150–400)
RBC: 4.39 MIL/uL (ref 4.22–5.81)
RDW: 13.3 % (ref 11.5–15.5)
WBC: 8.7 10*3/uL (ref 4.0–10.5)

## 2014-08-28 LAB — COMPREHENSIVE METABOLIC PANEL
ALT: 21 U/L (ref 0–53)
AST: 18 U/L (ref 0–37)
Albumin: 4.2 g/dL (ref 3.5–5.2)
Alkaline Phosphatase: 75 U/L (ref 39–117)
Anion gap: 14 (ref 5–15)
BUN: 8 mg/dL (ref 6–23)
CALCIUM: 9.6 mg/dL (ref 8.4–10.5)
CO2: 29 meq/L (ref 19–32)
Chloride: 99 mEq/L (ref 96–112)
Creatinine, Ser: 0.76 mg/dL (ref 0.50–1.35)
GFR calc Af Amer: >90 mL/min (ref >90)
Glucose, Bld: 131 mg/dL — ABNORMAL HIGH (ref 70–99)
Potassium: 3.9 mEq/L (ref 3.7–5.3)
SODIUM: 142 meq/L (ref 137–147)
Total Bilirubin: 0.4 mg/dL (ref 0.3–1.2)
Total Protein: 7.3 g/dL (ref 6.0–8.3)

## 2014-08-28 LAB — ETHANOL: Alcohol, Ethyl (B): <11 mg/dL (ref 0–11)

## 2014-08-28 LAB — RAPID URINE DRUG SCREEN, HOSP PERFORMED
Amphetamines: NOT DETECTED
Barbiturates: NOT DETECTED
Benzodiazepines: NOT DETECTED
Cocaine: NOT DETECTED
OPIATES: NOT DETECTED
TETRAHYDROCANNABINOL: NOT DETECTED

## 2014-08-28 MED ORDER — NICOTINE 14 MG/24HR TD PT24
14.0000 mg | MEDICATED_PATCH | Freq: Every day | TRANSDERMAL | Status: DC
  Administered 2014-08-28 – 2014-08-31 (×4): 14 mg via TRANSDERMAL
  Filled 2014-08-28 (×5): qty 1

## 2014-08-28 MED ORDER — TRAZODONE HCL 100 MG PO TABS
100.0000 mg | ORAL_TABLET | Freq: Every day | ORAL | Status: DC
  Administered 2014-08-28 – 2014-08-30 (×3): 100 mg via ORAL
  Filled 2014-08-28 (×3): qty 1

## 2014-08-28 MED ORDER — LORAZEPAM 1 MG PO TABS: 1.0000 mg | ORAL_TABLET | Freq: Three times a day (TID) | ORAL | Status: DC | PRN

## 2014-08-28 MED ORDER — QUETIAPINE FUMARATE 25 MG PO TABS
100.0000 mg | ORAL_TABLET | Freq: Two times a day (BID) | ORAL | Status: DC
  Administered 2014-08-28 – 2014-08-31 (×7): 100 mg via ORAL
  Filled 2014-08-28 (×7): qty 4

## 2014-08-28 MED ORDER — ONDANSETRON HCL 4 MG PO TABS: 4.0000 mg | ORAL_TABLET | Freq: Three times a day (TID) | ORAL | Status: DC | PRN

## 2014-08-28 MED ORDER — IBUPROFEN 400 MG PO TABS
600.0000 mg | ORAL_TABLET | Freq: Three times a day (TID) | ORAL | Status: DC | PRN
  Administered 2014-08-29: 600 mg via ORAL
  Filled 2014-08-28 (×2): qty 1

## 2014-08-28 NOTE — BH Assessment (Signed)
[name], AC at [facility], adult unit is at capacity. Contacted the following facilities for placement:  BED AVAILABLE, PT IS UNDER REVIEW: [facility], per Jane [facility], per Kristy [facility], per Jane  AT CAPACITY: [facility], per Tyra [facility], per Jennifer [facility], per Jackie [facility], per Leah [facility], per Tina [facility], per [facility], per Kathy [facility], per Candace [facility], per Kimberly [facility], per Ashley [name], per [place] [name], per Larry [facility], per Kristin [facility], per Kevin [facility], per Carl  NO RESPONSE: [facility]   Dodd Schmid Ellis Ovadia Lopp Jr, LPC, [facility] Triage Specialist [phone] 

## 2014-08-28 NOTE — ED Notes (Signed)
Pt up at desk, making second phone call

## 2014-08-28 NOTE — BH Assessment (Signed)
Relayed results of assessment to Nanine MeansJamison Lord, NP. Per Molli KnockJ. Lord pt meets inpt criteria and TTS can seek placement. Declined [facility] due to aggression.   Dr Bebe ShaggyWickline in agreement with plan.   RN informed of plan.   TTS to seek placement.    Clista BernhardtNancy Emmerich Cryer, [facility] LPPC Triage Specialist 08/28/2014 6:43 AM

## 2014-08-28 NOTE — ED Provider Notes (Signed)
CSN: [id number]     Arrival date & time 08/28/14  0101 History   First MD Initiated Contact with Patient 08/28/14 [phone]     Chief Complaint  Patient presents with  . Suicidal      Patient is a 23 y.o. male presenting with mental health disorder. The history is provided by the patient.  Mental Health Problem Presenting symptoms: hallucinations, suicidal thoughts and suicide attempt   Degree of incapacity (severity):  Severe Onset quality:  Sudden Timing:  Constant Progression:  Unchanged Chronicity:  New Relieved by:  None tried Worsened by:  Nothing tried Associated symptoms: no abdominal pain, no chest pain and no headaches   Pt reports hearing voices informing him to kill himself He reports he was about to hang himself but was stopped just prior to the episode by his roommate  He denies any other attempt  He denies fever/HA/CP/SOB   Past Medical History  Diagnosis Date  . Asthma   . Bipolar depression   . Schizophrenia   . Suicide attempt   . Homelessness    Past Surgical History  Procedure Laterality Date  . No past surgeries     No family history on file. History  Substance Use Topics  . Smoking status: Current Every Day Smoker -- 1.50 packs/day for 13 years    Types: Cigarettes  . Smokeless tobacco: Not on file  . Alcohol Use: No    Review of Systems  Constitutional: Negative for fever.  Respiratory: Negative for shortness of breath.   Cardiovascular: Negative for chest pain.  Gastrointestinal: Negative for vomiting and abdominal pain.  Neurological: Negative for headaches.  Psychiatric/Behavioral: Positive for suicidal ideas and hallucinations.  All other systems reviewed and are negative.     Allergies  Review of patient's allergies indicates no known allergies.  Home Medications   Prior to Admission medications   Not on File   BP 123/62 mmHg  Pulse 76  Temp(Src) 98.6 F (37 C) (Oral)  Resp 14  Ht 5\' 6"  (1.676 m)  Wt 175 lb (79.379 kg)   BMI 28.26 kg/m2  SpO2 97% Physical Exam CONSTITUTIONAL: Well developed/well nourished HEAD: Normocephalic/atraumatic EYES: EOMI/PERRL ENMT: Mucous membranes moist NECK: supple no meningeal signs, no wounds noted to neck SPINE:entire spine nontender CV: S1/S2 noted, no murmurs/rubs/gallops noted LUNGS: Lungs are clear to auscultation bilaterally, no apparent distress ABDOMEN: soft, nontender, no rebound or guarding NEURO: Pt is awake/alert, moves all extremitiesx4 EXTREMITIES: pulses normal, full ROM SKIN: warm, color normal PSYCH: conversant, makes appropriate eye contact  ED Course  Procedures Labs Review Labs Reviewed  CBC WITH DIFFERENTIAL - Abnormal; Notable for the following:    Hemoglobin 12.8 (*)    HCT 38.6 (*)    All other components within normal limits  COMPREHENSIVE METABOLIC PANEL - Abnormal; Notable for the following:    Glucose, Bld 131 (*)    All other components within normal limits  ETHANOL  URINE RAPID DRUG SCREEN (HOSP PERFORMED)    PATIENT IS MEDICALLY STABLE PSYCH HAS BEEN CONSULTED PT AGREEABLE WITH PLAN    MDM   Final diagnoses:  Suicide attempt    Nursing notes including past medical history and social history reviewed and considered in documentation Labs/vital reviewed and considered     Joya Gaskinsonald W Ranesha Val, MD 08/28/14 [phone]

## 2014-08-28 NOTE — ED Notes (Signed)
Pt sleeping at this time. Very drowsy. Falling asleep while trying to assess.

## 2014-08-28 NOTE — ED Notes (Signed)
[name] -- [phone] - called and requested for pt to return her call.

## 2014-08-28 NOTE — ED Notes (Signed)
Charge nurse and staffing office notified for pt.'s sitter , security notified to wand pt., paper scrubs given for pt. to wear.

## 2014-08-28 NOTE — ED Notes (Signed)
Breakfast tray called 

## 2014-08-28 NOTE — ED Notes (Signed)
Report given to POD C nurse. Pt being moved to room c25.

## 2014-08-28 NOTE — ED Notes (Signed)
Lunch order placed for pt.

## 2014-08-28 NOTE — BH Assessment (Signed)
[facility] Assessment Progress Note   Update: The following facilities were contacted by this clinician in an attempt to place the pt:  Referral Faxed For Review [facility] - beds per Kayla Baptist - beds available [facility] - beds per Amy Moore - Fax per [facility]- Faxed for possible wait list [facility] - beds per Tammy Rowan - beds per [facility] - beds per Kristen Rutherford - beds per Cindy Frye - fax referral [facility] - faxed referral for possible wait list  At [facility] per Danny  [facility] per Annie [facility] per Susie [place] per Evelyn [place] per [place] per Ladonna [name] per Ronn [facility] per Della Gaston per Olivia   No Answer [place] @ 0828 [place] @ 0939 [place] @ 0941   TTS will continue to seek placement for the pt.  Kristen Pearlena Ow, MS, LPC Licensed Professional Counselor Therapeutic Triage Specialist [facility] Phone: [phone] Fax: [phone]     
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
## 2014-08-28 NOTE — ED Notes (Signed)
Pt. reports suicidal ideation plans to hang himself , denies hallucinations .

## 2014-08-28 NOTE — ED Notes (Signed)
telepsych is set up at the bedside.

## 2014-08-28 NOTE — ED Notes (Signed)
Pt. wanded by security at triage .  

## 2014-08-28 NOTE — BH Assessment (Signed)
Reviewed notes prior to assessment, SI with plan to hang himself, command hallucinations telling him to do so.   Requested cart be placed with pt for assessment.   Assessment to commence shortly.    [name] BernhardtNancy Dietrick Barris, [facility] Triage Specialist 08/28/2014 5:28 AM

## 2014-08-28 NOTE — ED Notes (Signed)
Behavioral called and reported that patient meets inpatient criteria, seeking placement, too aggressive for behavioral health bed.

## 2014-08-29 ENCOUNTER — Encounter (HOSPITAL_COMMUNITY): Payer: Self-pay | Admitting: *Deleted

## 2014-08-29 NOTE — Progress Notes (Signed)
CSW met with this 23 y/o, single, Caucasian, male who presents in hospital garb with depressed affect, "not good" mood, Oriented x4.  Patient still states that he has S/I due to recent losses of his father and daughter.  Patient states he wants to get help primarily for substance abuse, "I am using way too much and if I go to the street I will just continue."  Patient states he would like to stabilize and go to a long term SA program.    CSW talked to the patient about [facility] and the patient sat up and his affect improved.  Patient states he is very interested, but does not want to be discharged to the street at this point.  Patient states he still has S/I and has little to live for, but would be interested in [employer] or something like this.  Plan:  1. Patient should be reevaluated by the morning and look at [facility], [facility], or [facility] for inpatient/residential treatment as he has Medicaid and they can all bill this for him.  Patient is accepting of inpatient SA treatment.

## 2014-08-30 DIAGNOSIS — F4325 Adjustment disorder with mixed disturbance of emotions and conduct: Secondary | ICD-10-CM

## 2014-08-30 DIAGNOSIS — T1491XA Suicide attempt, initial encounter: Secondary | ICD-10-CM | POA: Insufficient documentation

## 2014-08-30 DIAGNOSIS — R45851 Suicidal ideations: Secondary | ICD-10-CM

## 2014-08-30 DIAGNOSIS — F329 Major depressive disorder, single episode, unspecified: Secondary | ICD-10-CM

## 2014-08-30 NOTE — BH Assessment (Signed)
[facility]. They cannot accept adult Medicaid.  Harlin RainFord Ellis Ria CommentWarrick Jr, LPC, [facility] Triage Specialist [phone]

## 2014-08-30 NOTE — ED Notes (Signed)
TTS in progress 
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
## 2014-08-30 NOTE — BH Assessment (Signed)
Writer informed the RN, Clydie Braun(Karen) that the patient be re-evaluated by the NP Renata Caprice(Conrad) at 1:30pm

## 2014-08-31 ENCOUNTER — Inpatient Hospital Stay: Payer: Self-pay | Admitting: Psychiatry

## 2014-08-31 NOTE — ED Provider Notes (Signed)
Patient asleep when rounded on. There have been no reported complaints.  [name] T GoldAudree Camelston, MD 08/31/14 [phone]

## 2014-08-31 NOTE — Progress Notes (Signed)
Pt continues to meet inpt criteria, SW placed calls to facilities for follow up:  [facility]AC[facility]: [facility],  [facility], [facility], [facility]  [facility], [facility],  [facility],  [facility],  [facility] no male [facility]  Will continue to pursue placement  Derrell Lollingoris Landrum Carbonell, [place] Social Worker [phone]

## 2014-08-31 NOTE — ED Notes (Signed)
Olivia Mackieressa called from [facility] - advised pt has been accepted to Dr Ardyth HarpsHernandez. Requested for pt not to be sent until after she calls back w/room assignment. Report [phone]

## 2014-08-31 NOTE — ED Notes (Signed)
Attempted to call report to [place]. Advised will need to call back.

## 2014-08-31 NOTE — ED Notes (Addendum)
Julieanne Cottonina, AC, [facility], aware pt accepted to [facility] per Olivia Mackieressa. Requested for pt to not be sent until calls back.

## 2014-09-01 LAB — CBC WITH DIFFERENTIAL/PLATELET
BASOS PCT: 0.6 %
Basophil #: 0 10*3/uL (ref 0.0–0.1)
EOS ABS: 0.1 10*3/uL (ref 0.0–0.7)
Eosinophil %: 0.9 %
HCT: 43.2 % (ref 40.0–52.0)
HGB: 14.3 g/dL (ref 13.0–18.0)
LYMPHS ABS: 1.7 10*3/uL (ref 1.0–3.6)
Lymphocyte %: 31 %
MCH: 29.3 pg (ref 26.0–34.0)
MCHC: 33.1 g/dL (ref 32.0–36.0)
MCV: 88 fL (ref 80–100)
MONO ABS: 0.4 x10 3/mm (ref 0.2–1.0)
Monocyte %: 7.3 %
Neutrophil #: 3.3 10*3/uL (ref 1.4–6.5)
Neutrophil %: 60.2 %
Platelet: 221 10*3/uL (ref 150–440)
RBC: 4.89 10*6/uL (ref 4.40–5.90)
RDW: 13.4 % (ref 11.5–14.5)
WBC: 5.5 10*3/uL (ref 3.8–10.6)

## 2014-09-01 LAB — COMPREHENSIVE METABOLIC PANEL
ALBUMIN: 4.1 g/dL (ref 3.4–5.0)
ALT: 30 U/L
AST: 21 U/L (ref 15–37)
Alkaline Phosphatase: 77 U/L
Anion Gap: 7 (ref 7–16)
BUN: 14 mg/dL (ref 7–18)
Bilirubin,Total: 0.3 mg/dL (ref 0.2–1.0)
CHLORIDE: 99 mmol/L (ref 98–107)
Calcium, Total: 9.2 mg/dL (ref 8.5–10.1)
Co2: 29 mmol/L (ref 21–32)
Creatinine: 0.88 mg/dL (ref 0.60–1.30)
EGFR (Non-African Amer.): >60
GLUCOSE: 139 mg/dL — AB (ref 65–99)
Osmolality: 273 (ref 275–301)
POTASSIUM: 3.8 mmol/L (ref 3.5–5.1)
Sodium: 135 mmol/L — ABNORMAL LOW (ref 136–145)
Total Protein: 7.6 g/dL (ref 6.4–8.2)

## 2014-09-01 LAB — DRUG SCREEN, URINE
Amphetamines, Ur Screen: NEGATIVE (ref ?–1000)
Barbiturates, Ur Screen: NEGATIVE (ref ?–200)
Benzodiazepine, Ur Scrn: NEGATIVE (ref ?–200)
CANNABINOID 50 NG, UR ~~LOC~~: NEGATIVE (ref ?–50)
Cocaine Metabolite,Ur ~~LOC~~: NEGATIVE (ref ?–300)
MDMA (ECSTASY) UR SCREEN: NEGATIVE (ref ?–500)
Methadone, Ur Screen: NEGATIVE (ref ?–300)
Opiate, Ur Screen: NEGATIVE (ref ?–300)
PHENCYCLIDINE (PCP) UR S: NEGATIVE (ref ?–25)
TRICYCLIC, UR SCREEN: NEGATIVE (ref ?–1000)

## 2014-09-07 ENCOUNTER — Emergency Department (HOSPITAL_COMMUNITY)
Admission: EM | Admit: 2014-09-07 | Discharge: 2014-09-08 | Disposition: A | Discharge status: Other Acute Inpatient Hospital | Payer: Medicaid Other | Attending: Emergency Medicine | Admitting: Emergency Medicine

## 2014-09-07 ENCOUNTER — Encounter (HOSPITAL_COMMUNITY): Payer: Self-pay | Admitting: Adult Health

## 2014-09-07 DIAGNOSIS — J45909 Unspecified asthma, uncomplicated: Secondary | ICD-10-CM | POA: Insufficient documentation

## 2014-09-07 DIAGNOSIS — R45851 Suicidal ideations: Secondary | ICD-10-CM

## 2014-09-07 DIAGNOSIS — Z720 Tobacco use: Secondary | ICD-10-CM | POA: Diagnosis not present

## 2014-09-07 LAB — CBC
HEMATOCRIT: 41.8 % (ref 39.0–52.0)
Hemoglobin: 14 g/dL (ref 13.0–17.0)
MCH: 29.4 pg (ref 26.0–34.0)
MCHC: 33.5 g/dL (ref 30.0–36.0)
MCV: 87.8 fL (ref 78.0–100.0)
PLATELETS: 221 10*3/uL (ref 150–400)
RBC: 4.76 MIL/uL (ref 4.22–5.81)
RDW: 13.4 % (ref 11.5–15.5)
WBC: 9.2 10*3/uL (ref 4.0–10.5)

## 2014-09-07 LAB — RAPID URINE DRUG SCREEN, HOSP PERFORMED
Amphetamines: NOT DETECTED
Barbiturates: NOT DETECTED
Benzodiazepines: NOT DETECTED
COCAINE: NOT DETECTED
OPIATES: NOT DETECTED
TETRAHYDROCANNABINOL: NOT DETECTED

## 2014-09-07 LAB — COMPREHENSIVE METABOLIC PANEL
ALBUMIN: 4.4 g/dL (ref 3.5–5.2)
ALK PHOS: 79 U/L (ref 39–117)
ALT: 26 U/L (ref 0–53)
AST: 21 U/L (ref 0–37)
Anion gap: 14 (ref 5–15)
BUN: 14 mg/dL (ref 6–23)
CO2: 28 mEq/L (ref 19–32)
Calcium: 9.7 mg/dL (ref 8.4–10.5)
Chloride: 98 mEq/L (ref 96–112)
Creatinine, Ser: 0.71 mg/dL (ref 0.50–1.35)
GFR calc Af Amer: >90 mL/min (ref >90)
GFR calc non Af Amer: >90 mL/min (ref >90)
Glucose, Bld: 99 mg/dL (ref 70–99)
POTASSIUM: 4.2 meq/L (ref 3.7–5.3)
Sodium: 140 mEq/L (ref 137–147)
TOTAL PROTEIN: 7.9 g/dL (ref 6.0–8.3)
Total Bilirubin: <0.2 mg/dL — ABNORMAL LOW (ref 0.3–1.2)

## 2014-09-07 LAB — ACETAMINOPHEN LEVEL

## 2014-09-07 LAB — ETHANOL

## 2014-09-07 LAB — SALICYLATE LEVEL: Salicylate Lvl: <2 mg/dL — ABNORMAL LOW (ref 2.8–20.0)

## 2014-09-07 MED ORDER — IBUPROFEN 400 MG PO TABS: 600.0000 mg | ORAL_TABLET | Freq: Three times a day (TID) | ORAL | Status: DC | PRN

## 2014-09-07 MED ORDER — ONDANSETRON HCL 4 MG PO TABS: 4.0000 mg | ORAL_TABLET | Freq: Three times a day (TID) | ORAL | Status: DC | PRN

## 2014-09-07 MED ORDER — NICOTINE 21 MG/24HR TD PT24
21.0000 mg | MEDICATED_PATCH | Freq: Every day | TRANSDERMAL | Status: DC
  Administered 2014-09-08: 21 mg via TRANSDERMAL
  Filled 2014-09-07: qty 1

## 2014-09-07 MED ORDER — LORAZEPAM 1 MG PO TABS: 1.0000 mg | ORAL_TABLET | Freq: Three times a day (TID) | ORAL | Status: DC | PRN

## 2014-09-07 MED ORDER — ALUM & MAG HYDROXIDE-SIMETH 200-200-20 MG/5ML PO SUSP: 30.0000 mL | ORAL | Status: DC | PRN

## 2014-09-07 MED ORDER — ACETAMINOPHEN 325 MG PO TABS: 650.0000 mg | ORAL_TABLET | ORAL | Status: DC | PRN

## 2014-09-07 MED ORDER — ZOLPIDEM TARTRATE 5 MG PO TABS: 5.0000 mg | ORAL_TABLET | Freq: Every evening | ORAL | Status: DC | PRN

## 2014-09-07 NOTE — ED Provider Notes (Signed)
CSN: [id number]     Arrival date & time 09/07/14  1918 History   First MD Initiated Contact with Patient 09/07/14 2203     Chief Complaint  Patient presents with  . Suicidal   (Consider location/radiation/quality/duration/timing/severity/associated sxs/prior Treatment) HPI [name] is a 23 yo male presenting with suicidal ideation.  He reports being more upset after losing his daughter 4 days ago.  He states he went to a bridge today and was preparing to jump off when a stranger drove by and brought him to the emergency department.  His last suicide attempt was 10 days ago.  He continues to endorse suicidal ideation despite taking his prescription meds as directed.  He states if he were to leave the emergency department he would walk to the closest bridge and jump off.  He denies drinking any alcohol, but reports smoking 2 packs of cigarettes a day, intermittently smokes marijuana but reports using heroin, crack and cocaine 6-7 days a week for years.  His last dose of thesedrugs was last week.  He denies any HI.  He denies any physical complaints including chest pain, shortness of breath, cough, fever, nausea, vomiting, diarrhea or abd pain.  Past Medical History  Diagnosis Date  . Asthma   . Bipolar depression   . Schizophrenia   . Suicide attempt   . Homelessness   . H/O suicide attempt     attempted hanging, gun to head, cut self   Past Surgical History  Procedure Laterality Date  . No past surgeries     History reviewed. No pertinent family history. History  Substance Use Topics  . Smoking status: Current Every Day Smoker -- 1.50 packs/day for 13 years    Types: Cigarettes  . Smokeless tobacco: Not on file  . Alcohol Use: No    Review of Systems  Constitutional: Negative for fever and chills.  HENT: Negative for sore throat.   Eyes: Negative for visual disturbance.  Respiratory: Negative for cough and shortness of breath.   Cardiovascular: Negative for chest pain and  leg swelling.  Gastrointestinal: Negative for nausea, vomiting and diarrhea.  Genitourinary: Negative for dysuria.  Musculoskeletal: Negative for myalgias.  Skin: Negative for rash.  Neurological: Negative for weakness, numbness and headaches.  Psychiatric/Behavioral: Positive for suicidal ideas.      Allergies  Review of patient's allergies indicates no known allergies.  Home Medications   Prior to Admission medications   Not on File   BP 140/68 mmHg  Pulse 107  Temp(Src) 99 F (37.2 C) (Oral)  Resp 20  Ht 5\' 7"  (1.702 m)  Wt 193 lb (87.544 kg)  BMI 30.22 kg/m2  SpO2 99% Physical Exam  Constitutional: He is oriented to person, place, and time. He appears well-developed and well-nourished. No distress.  HENT:  Head: Normocephalic and atraumatic.  Mouth/Throat: Oropharynx is clear and moist. No oropharyngeal exudate.  Eyes: Conjunctivae are normal.  Neck: Neck supple. No thyromegaly present.  Cardiovascular: Normal rate, regular rhythm and intact distal pulses.   Pulmonary/Chest: Effort normal and breath sounds normal. No respiratory distress. He has no wheezes. He has no rales. He exhibits no tenderness.  Abdominal: Soft. There is no tenderness.  Musculoskeletal: He exhibits no tenderness.  Lymphadenopathy:    He has no cervical adenopathy.  Neurological: He is alert and oriented to person, place, and time.  Skin: Skin is warm and dry. No rash noted. He is not diaphoretic.  Psychiatric: He has a normal mood and affect. His speech  is normal and behavior is normal. Judgment normal. Cognition and memory are normal. He expresses suicidal ideation. He expresses no homicidal ideation. He expresses suicidal plans.  Nursing note and vitals reviewed.   ED Course  Procedures (including critical care time) Labs Review Labs Reviewed  COMPREHENSIVE METABOLIC PANEL - Abnormal; Notable for the following:    Total Bilirubin <0.2 (*)    All other components within normal limits   SALICYLATE LEVEL - Abnormal; Notable for the following:    Salicylate Lvl <2.0 (*)    All other components within normal limits  ACETAMINOPHEN LEVEL  CBC  ETHANOL  URINE RAPID DRUG SCREEN (HOSP PERFORMED)    Imaging Review No results found.   EKG Interpretation None      MDM   Final diagnoses:  Suicidal ideation   23 yo male with active SI and plan. He has been medically cleared in the ED and is awaiting consult by TTS team for possible placement. He denies HI and appears stable and in NAD. [facility]   Filed Vitals:   09/07/14 1934 09/07/14 2213 09/08/14 0001  BP: 140/68  114/46  Pulse: 107  58  Temp: 99 F (37.2 C)  97.8 F (36.6 C)  TempSrc: Oral  Oral  Resp: 20  14  Height:  5\' 7"  (1.702 m)   Weight:  193 lb (87.544 kg)   SpO2: 99%  99%   Meds given in ED:  Medications  acetaminophen (TYLENOL) tablet 650 mg (not administered)  ibuprofen (ADVIL,MOTRIN) tablet 600 mg (not administered)  zolpidem (AMBIEN) tablet 5 mg (not administered)  nicotine (NICODERM CQ - dosed in mg/24 hours) patch 21 mg (not administered)  ondansetron (ZOFRAN) tablet 4 mg (not administered)  alum & mag hydroxide-simeth (MAALOX/MYLANTA) 200-200-20 MG/5ML suspension 30 mL (not administered)  LORazepam (ATIVAN) tablet 1 mg (not administered)    New Prescriptions   No medications on file       Harle BattiestElizabeth Konni Kesinger, NP 09/08/14 0028  Gilda Creasehristopher J. Pollina, MD 09/08/14 [phone]

## 2014-09-07 NOTE — ED Notes (Addendum)
Presents with Suicidal ideation for 3 days-attempted to jump off bridge today.  Denies HI.  Staffing office called

## 2014-09-07 NOTE — ED Notes (Addendum)
Called security to wand patient, wanded at this time.

## 2014-09-08 ENCOUNTER — Encounter (HOSPITAL_COMMUNITY): Payer: Self-pay | Admitting: *Deleted

## 2014-09-08 ENCOUNTER — Inpatient Hospital Stay (HOSPITAL_COMMUNITY)
Admission: AD | Admit: 2014-09-08 | Discharge: 2014-09-10 | DRG: 885 | Disposition: A | Discharge status: Home / Self Care | Payer: Medicaid Other | Source: Intra-hospital | Attending: Psychiatry | Admitting: Psychiatry

## 2014-09-08 ENCOUNTER — Encounter (HOSPITAL_COMMUNITY): Payer: Self-pay | Admitting: Psychiatry

## 2014-09-08 DIAGNOSIS — Z62810 Personal history of physical and sexual abuse in childhood: Secondary | ICD-10-CM | POA: Diagnosis present

## 2014-09-08 DIAGNOSIS — Z23 Encounter for immunization: Secondary | ICD-10-CM

## 2014-09-08 DIAGNOSIS — F17210 Nicotine dependence, cigarettes, uncomplicated: Secondary | ICD-10-CM | POA: Diagnosis present

## 2014-09-08 DIAGNOSIS — Z9114 Patient's other noncompliance with medication regimen: Secondary | ICD-10-CM | POA: Diagnosis present

## 2014-09-08 DIAGNOSIS — G4700 Insomnia, unspecified: Secondary | ICD-10-CM | POA: Diagnosis present

## 2014-09-08 DIAGNOSIS — G479 Sleep disorder, unspecified: Secondary | ICD-10-CM | POA: Diagnosis present

## 2014-09-08 DIAGNOSIS — F332 Major depressive disorder, recurrent severe without psychotic features: Secondary | ICD-10-CM | POA: Diagnosis present

## 2014-09-08 DIAGNOSIS — J45909 Unspecified asthma, uncomplicated: Secondary | ICD-10-CM | POA: Diagnosis present

## 2014-09-08 DIAGNOSIS — R4589 Other symptoms and signs involving emotional state: Secondary | ICD-10-CM | POA: Diagnosis present

## 2014-09-08 DIAGNOSIS — Z590 Homelessness: Secondary | ICD-10-CM

## 2014-09-08 DIAGNOSIS — F1420 Cocaine dependence, uncomplicated: Secondary | ICD-10-CM | POA: Insufficient documentation

## 2014-09-08 DIAGNOSIS — Z634 Disappearance and death of family member: Secondary | ICD-10-CM

## 2014-09-08 DIAGNOSIS — F411 Generalized anxiety disorder: Secondary | ICD-10-CM | POA: Diagnosis present

## 2014-09-08 DIAGNOSIS — J449 Chronic obstructive pulmonary disease, unspecified: Secondary | ICD-10-CM | POA: Diagnosis present

## 2014-09-08 DIAGNOSIS — F410 Panic disorder [episodic paroxysmal anxiety] without agoraphobia: Secondary | ICD-10-CM | POA: Diagnosis present

## 2014-09-08 DIAGNOSIS — F1290 Cannabis use, unspecified, uncomplicated: Secondary | ICD-10-CM

## 2014-09-08 DIAGNOSIS — F1590 Other stimulant use, unspecified, uncomplicated: Secondary | ICD-10-CM

## 2014-09-08 DIAGNOSIS — R4689 Other symptoms and signs involving appearance and behavior: Secondary | ICD-10-CM

## 2014-09-08 DIAGNOSIS — R45851 Suicidal ideations: Secondary | ICD-10-CM | POA: Diagnosis present

## 2014-09-08 DIAGNOSIS — F515 Nightmare disorder: Secondary | ICD-10-CM | POA: Diagnosis present

## 2014-09-08 DIAGNOSIS — F4310 Post-traumatic stress disorder, unspecified: Secondary | ICD-10-CM | POA: Diagnosis present

## 2014-09-08 DIAGNOSIS — F1120 Opioid dependence, uncomplicated: Secondary | ICD-10-CM | POA: Insufficient documentation

## 2014-09-08 DIAGNOSIS — F1220 Cannabis dependence, uncomplicated: Secondary | ICD-10-CM | POA: Insufficient documentation

## 2014-09-08 DIAGNOSIS — F1490 Cocaine use, unspecified, uncomplicated: Secondary | ICD-10-CM

## 2014-09-08 MED ORDER — MAGNESIUM HYDROXIDE 400 MG/5ML PO SUSP: 30.0000 mL | Freq: Every day | ORAL | Status: DC | PRN

## 2014-09-08 MED ORDER — VENLAFAXINE HCL ER 75 MG PO CP24
225.0000 mg | ORAL_CAPSULE | Freq: Every day | ORAL | Status: DC
  Administered 2014-09-09 – 2014-09-10 (×2): 225 mg via ORAL
  Filled 2014-09-08 (×3): qty 3

## 2014-09-08 MED ORDER — INFLUENZA VAC SPLIT QUAD 0.5 ML IM SUSY
0.5000 mL | PREFILLED_SYRINGE | INTRAMUSCULAR | Status: AC
  Administered 2014-09-09: 0.5 mL via INTRAMUSCULAR
  Filled 2014-09-08: qty 0.5

## 2014-09-08 MED ORDER — TRAZODONE HCL 50 MG PO TABS: 150.0000 mg | ORAL_TABLET | Freq: Every day | ORAL | Status: DC

## 2014-09-08 MED ORDER — ALBUTEROL SULFATE HFA 108 (90 BASE) MCG/ACT IN AERS
1.0000 | INHALATION_SPRAY | RESPIRATORY_TRACT | Status: DC | PRN
Start: 2014-09-08 — End: 2014-09-10

## 2014-09-08 MED ORDER — GABAPENTIN 300 MG PO CAPS
600.0000 mg | ORAL_CAPSULE | Freq: Three times a day (TID) | ORAL | Status: DC
  Administered 2014-09-08 – 2014-09-10 (×7): 600 mg via ORAL
  Filled 2014-09-08 (×11): qty 2

## 2014-09-08 MED ORDER — IBUPROFEN 200 MG PO TABS: 400.0000 mg | ORAL_TABLET | Freq: Four times a day (QID) | ORAL | Status: DC | PRN

## 2014-09-08 MED ORDER — ACETAMINOPHEN 325 MG PO TABS: 650.0000 mg | ORAL_TABLET | Freq: Four times a day (QID) | ORAL | Status: DC | PRN

## 2014-09-08 MED ORDER — TRAZODONE HCL 150 MG PO TABS
150.0000 mg | ORAL_TABLET | Freq: Every day | ORAL | Status: DC
  Administered 2014-09-08 – 2014-09-09 (×2): 150 mg via ORAL
  Filled 2014-09-08 (×4): qty 1

## 2014-09-08 MED ORDER — QUETIAPINE FUMARATE 25 MG PO TABS
300.0000 mg | ORAL_TABLET | Freq: Two times a day (BID) | ORAL | Status: DC
  Administered 2014-09-08: 300 mg via ORAL
  Filled 2014-09-08: qty 4
  Filled 2014-09-08: qty 1

## 2014-09-08 MED ORDER — HYDROXYZINE HCL 25 MG PO TABS: 50.0000 mg | ORAL_TABLET | Freq: Every day | ORAL | Status: DC

## 2014-09-08 MED ORDER — ENSURE COMPLETE PO LIQD
237.0000 mL | Freq: Two times a day (BID) | ORAL | Status: DC
  Administered 2014-09-08 – 2014-09-10 (×4): 237 mL via ORAL

## 2014-09-08 MED ORDER — HYDROXYZINE HCL 50 MG PO TABS
50.0000 mg | ORAL_TABLET | Freq: Every day | ORAL | Status: DC
  Administered 2014-09-08 – 2014-09-09 (×2): 50 mg via ORAL
  Filled 2014-09-08 (×4): qty 1

## 2014-09-08 MED ORDER — NICOTINE 21 MG/24HR TD PT24
21.0000 mg | MEDICATED_PATCH | Freq: Every day | TRANSDERMAL | Status: DC
  Administered 2014-09-09 – 2014-09-10 (×2): 21 mg via TRANSDERMAL
  Filled 2014-09-08 (×3): qty 1

## 2014-09-08 MED ORDER — ALUM & MAG HYDROXIDE-SIMETH 200-200-20 MG/5ML PO SUSP
30.0000 mL | ORAL | Status: DC | PRN
  Administered 2014-09-08: 30 mL via ORAL
  Filled 2014-09-08: qty 30

## 2014-09-08 MED ORDER — VENLAFAXINE HCL ER 75 MG PO CP24
225.0000 mg | ORAL_CAPSULE | Freq: Every day | ORAL | Status: DC
  Administered 2014-09-08: 225 mg via ORAL
  Filled 2014-09-08 (×2): qty 1

## 2014-09-08 NOTE — BH Assessment (Signed)
Inpt recommended, no [facility] beds available. Sent referrals to:  [facility]  Clista BernhardtNancy Kaenan Jake, [place] Triage Specialist 09/08/2014 5:57 AM

## 2014-09-08 NOTE — [facility] Group Notes (Signed)
[facility] LCSW Group Therapy  09/08/2014 1:28 PM  Type of Therapy:  Group Therapy  Participation Level:  Active  Participation Quality:  Appropriate and Attentive  Affect:  Appropriate  Cognitive:  Alert  Insight:  Improving  Engagement in Therapy:  Improving  Modes of Intervention:  Discussion, Education, Exploration, Limit-setting, Rapport Building, Socialization and Support  Summary of Progress/Problems: MHA Speaker came to talk about his personal journey with substance abuse and mental illness. The pt processed ways by which to relate to the speaker. MHA speaker provided handouts and educational information pertaining to groups and services offered by the [facility]. Fatih stated that he enjoyed the music played by speaker and showed interest in the groups offered by [facility]. He thanked the speaker when group concluded.    Smart, Alizia Greif LCSWA 09/08/2014, 1:28 PM

## 2014-09-08 NOTE — Progress Notes (Signed)
Patient ID: [name], male   DOB: 15-Jun-1991, 23 y.o.   MRN: [id number]  [name] is a 23 year old male [facility] and [facility]. Patient reports that his three year old daughter passed away recently and he is depressed due to this situation. Patient reports he does not know how his daughter died. [facility]. Patient also reports he's had 5-6 admissions to mental health facilities during the past year. Patient has a past medical history of asthma, bipolar depression, and schizophrenia. Patient reports he has been using cocaine and heroin as well as smoking THC. Patient reports when he uses substances it is, "as much as possible." Patient's UDS is negative at this time. Patient is oriented to the unit and verbalized understanding of the admission process. Patient has no questions or concerns. Q15 minute safety checks initiated and are maintained at this time.

## 2014-09-08 NOTE — [facility] Suicide Risk Assessment (Signed)
   Nursing information obtained from:    Demographic factors:    Current Mental Status:    Loss Factors:    Historical Factors:    Risk Reduction Factors:    Total Time spent with patient: 45 minutes  CLINICAL FACTORS:   Depression:   Hopelessness Impulsivity Alcohol/Substance Abuse/Dependencies Previous Psychiatric Diagnoses and Treatments  Psychiatric Specialty Exam: Physical Exam  ROS  Blood pressure 112/58, pulse 124, temperature 98.6 F (37 C), temperature source Oral, resp. rate 16, height 5' 5.25" (1.657 m), weight 85.276 kg (188 lb).Body mass index is 31.06 kg/(m^2).   Please see H&P for MSE.  SUICIDE RISK:   Severe:  Frequent, intense, and enduring suicidal ideation, specific plan, no subjective intent, but some objective markers of intent (i.e., choice of lethal method), the method is accessible, some limited preparatory behavior, evidence of impaired self-control, severe dysphoria/symptomatology, multiple risk factors present, and few if any protective factors, particularly a lack of social support.  PLAN OF CARE:Please see H&P.   I certify that inpatient services furnished can reasonably be expected to improve the patient's condition.  Jeniel Slauson md 09/08/2014, 12:34 PM

## 2014-09-08 NOTE — Tx Team (Signed)
Initial Interdisciplinary Treatment Plan   PATIENT STRESSORS: Loss of child   PATIENT STRENGTHS: General fund of knowledge   PROBLEM LIST: Problem List/Patient Goals Date to be addressed Date deferred Reason deferred Estimated date of resolution  Depression 09/08/2014           Risk for Suicide 09/08/2014           Loss of 23 year old child 09/08/2014                              DISCHARGE CRITERIA:  Motivation to continue treatment in a less acute level of care Verbal commitment to aftercare and medication compliance  PRELIMINARY DISCHARGE PLAN: Attend 12-step recovery group Outpatient therapy  PATIENT/FAMIILY INVOLVEMENT: This treatment plan has been presented to and reviewed with the patient, [name].  The patient and family have been given the opportunity to ask questions and make suggestions.  Ned ClinesDopson, [name] 09/08/2014, 1:26 PM
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
## 2014-09-08 NOTE — ED Notes (Signed)
[facility] called for pt pickup

## 2014-09-08 NOTE — ED Notes (Signed)
TTS in progress at this time.  

## 2014-09-08 NOTE — ED Notes (Signed)
Patient states that his 23 year old daughter died 2 weeks ago ([name]). He states that they think it was sids, attempted to verify pt situation and found that the child is not deceased. The mother of the child has posted pictures of the child in the last 12 hours

## 2014-09-08 NOTE — Progress Notes (Signed)
[facility]. Pod C RN aware.  [name], MSW  Social Worker [phone]

## 2014-09-09 LAB — LIPID PANEL
Cholesterol: 215 mg/dL — ABNORMAL HIGH (ref 0–200)
HDL: 64 mg/dL (ref >39)
LDL CALC: 130 mg/dL — AB (ref 0–99)
Total CHOL/HDL Ratio: 3.4 RATIO
Triglycerides: 103 mg/dL (ref <150)
VLDL: 21 mg/dL (ref 0–40)

## 2014-09-09 LAB — HEMOGLOBIN A1C
HEMOGLOBIN A1C: 5.5 % (ref <5.7)
Mean Plasma Glucose: 111 mg/dL (ref <117)

## 2014-09-09 LAB — TSH: TSH: 4.1 u[IU]/mL (ref 0.350–4.500)

## 2014-09-09 MED ORDER — ARIPIPRAZOLE 2 MG PO TABS
2.0000 mg | ORAL_TABLET | Freq: Every day | ORAL | Status: DC
  Administered 2014-09-09: 2 mg via ORAL
  Filled 2014-09-09 (×2): qty 1

## 2014-09-09 NOTE — Progress Notes (Signed)
Pt is a new to the hall this afternoon.  Pt has been visible on the hall and in the dayroom watching TV and talking with peers.  Pt reports he is here for depression and SI.  He says his 773 yo daughter recently died.  Staff reports he has told this same story before at other admissions.  Pt reports he is still having passive SI.  He denies HI/AV at this time.  Pt was encouraged to make his needs known to staff.  Pt voiced understanding.  Pt informed writer what his nighttime meds were supposed to be.  Med schedule was discussed with pt.  Support and encouragement offered.  Safety maintained with q15 minute checks.

## 2014-09-09 NOTE — [facility] Group Notes (Signed)
[facility] LCSW Group Therapy  09/09/2014 3:34 PM   Type of Therapy:  Group Therapy  Participation Level:  Active  Participation Quality:  Attentive  Affect:  Appropriate  Cognitive:  Appropriate  Insight:  Improving  Engagement in Therapy:  Engaged  Modes of Intervention:  Clarification, Education, Exploration and Socialization  Summary of Progress/Problems: Today's group focused on relapse prevention.  We defined the term, and then brainstormed on ways to prevent relapse.  [name] talked about his struggles with drugs, particularly about heroin.  Went into great detail about being "dope sick" and going through withdrawals. Also stated that one cannot relapse if one is in recovery.  He had a minority position on that one.  Went on to talk about people making fun of him, particularly his ears, and how he just laughs it off.  Was given pointed feedback by 3 group members when he blamed his heroin use on his mother.  Was basically told "You are a grown man.  You need to take responsibility for your decisions rather than blaming others.  [name], [name] 09/09/2014 , 3:34 PM

## 2014-09-09 NOTE — Progress Notes (Signed)
D:  Patient's self inventory sheet,  Good sleep, sleep medication helpful.  Good appetite, low energy level, good concentration.  Dated depression, anxiety and hopeless #10.  Denied withdrawals.  SI, will think about how, contracts for safety.  Does have physical pain.   Goal to work on after care plans, will talk to SW.  No discharge plan. A:  Medications administered per MD orders.  Emotional support and encouragement given patient. R:  Denied A/V hallucination.  Denied HI.  SI, contracts for safety.  Safety maintained with 15 minute checks.

## 2014-09-09 NOTE — [facility] Suicide Risk Assessment (Signed)
[facility] INPATIENT:  Family/Significant Other Suicide Prevention Education  Suicide Prevention Education:  Patient Refusal for Family/Significant Other Suicide Prevention Education: The patient [name] has refused to provide written consent for family/significant other to be provided Family/Significant Other Suicide Prevention Education during admission and/or prior to discharge.  Physician notified.  [name], Lovelace Cerveny B 09/09/2014, 3:20 PM
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
## 2014-09-09 NOTE — Plan of Care (Signed)
Problem: Consults Goal: Depression Patient Education See Patient Education Module for education specifics.  Outcome: Completed/Met Date Met:  09/09/14  Discussed depression with patient.

## 2014-09-09 NOTE — Tx Team (Addendum)
  Interdisciplinary Treatment Plan Update   Date Reviewed:  09/09/2014  Time Reviewed:  10:51 AM  Progress in Treatment:   Attending groups: Yes Participating in groups: Yes Taking medication as prescribed: Yes  Tolerating medication: Yes Family/Significant other contact made: Yes Friend Patient understands diagnosis: Yes AEB asking for help with depression, SI, substance abuse placement Discussing patient identified problems/goals with staff: Yes  See initial care plan Medical problems stabilized or resolved: Yes Denies suicidal/homicidal ideation: Yes  In tx team Patient has not harmed self or others: Yes  For review of initial/current patient goals, please see plan of care.  Estimated Length of Stay:  D/C tomorrow  Reason for Continuation of Hospitalization: Depression Medication stabilization  New Problems/Goals identified:  N/A  Discharge Plan or Barriers:   States he wants to try to get into a long term rehab work program  Addendum  Pt did not state where he will be going at d/c tomorrow.  He was given a list of [address], and will be given a bus pass if he requests one.  Additional Comments:  " I was suicidal and depressed ,I just lost my 23 year old daughter." :[name] is a 23 y.o. Caucasian male [facility] SI/Suicide attempt /Depression. Pt reported that he has been SI x3 days with a plan to jump off a bridge; he attempted on 09/07/14 and was stopped by a friend. Pt stated his current mental state was triggered his daughter's passing on Thursday 09/02/14. Pt also stated that he was self medicating by using crack/cocaine and and heroin-his last use was 09/02/14. Pt stated he uses $40-$50 worth of heroin, daily and smokes or snorts the product; uses $35 worth of crack/cocaine and mixes with heroin. He been using both drugs since he was 23 yrs old. Pt is non complaint with psych meds, stating that he was [facility] but has not had any interaction with them in 2 yrs.  Addendum:  Called friend Trudee Gripancy Roberts at [phone] who reported that the information about the daughter is untrue. The mother does not want him to visit as he has been in and out of prison and has no stability in his life, as well as bing a chronic liar.  Pt was told he will be discharging tomorrow.  He [facility], and stated he still wanted to finish the application for [facility].  He was encouraged to finish it, and if he does we will FAX it and see if he can get a phone interview lined up prior to his d/c.  Attendees:  Signature: Ivin BootySarama Eappen, MD 09/09/2014 10:51 AM   Signature: Richelle Itood Amador Braddy, LCSW 09/09/2014 10:51 AM  Signature: Fransisca KaufmannLaura Davis, NP 09/09/2014 10:51 AM  Signature: Joslyn Devonaroline Beaudry, RN 09/09/2014 10:51 AM  Signature: Liborio NixonPatrice White, RN 09/09/2014 10:51 AM  Signature:  09/09/2014 10:51 AM  Signature:   09/09/2014 10:51 AM  Signature:    Signature:    Signature:    Signature:    Signature:    Signature:      Scribe for Treatment Team:   Richelle Itood Orville Mena, LCSW  09/09/2014 10:51 AM

## 2014-09-09 NOTE — Progress Notes (Addendum)
[facility] Discharge [facility] :  Will you be returning to the same living situation after discharge: No  [facility] [place] At discharge, do you have transportation home?:Yes,  bus pass Do you have the ability to pay for your medications:Yes,  mental health  Release of information consent forms completed and in the chart;  Patient's signature needed at discharge.  [facility]  Patient denies SI/HI:   Yes,  yes    Safety Planning and Suicide Prevention discussed:  Yes,  yes  Ida Rogueorth, Micheal Sheen B 09/09/2014, 3:20 PM

## 2014-09-09 NOTE — [facility] Group Notes (Signed)
The focus of this group is to educate the patient on the purpose and policies of crisis stabilization and provide a format to answer questions about their admission.  The group details unit policies and expectations of patients while admitted.  Patient attended 0900 nurse education orientation group this morning.  Patient actively participated, appropriate affect, alert, appropriate insight and engagement.  Today patient will work on 3 goals for discharge.  

## 2014-09-09 NOTE — Progress Notes (Signed)
Adult Psychoeducational Group Note  Date:  09/09/2014 Time:  1:25 AM  Group Topic/Focus:  Wrap-Up Group:   The focus of this group is to help patients review their daily goal of treatment and discuss progress on daily workbooks.  Participation Level:  Active  Participation Quality:  Attentive  Affect:  Appropriate  Cognitive:  Alert  Insight: Good  Engagement in Group:  Engaged  Modes of Intervention:  Discussion  Additional Comments:  Patient says that he had a rough day and hoped for better days ahead. Patient just arrived on the unit today and didn't have much to share.  JOHNSON,TAWANA 09/09/2014, 1:25 AM

## 2014-09-10 DIAGNOSIS — F1220 Cannabis dependence, uncomplicated: Secondary | ICD-10-CM | POA: Insufficient documentation

## 2014-09-10 DIAGNOSIS — F1120 Opioid dependence, uncomplicated: Secondary | ICD-10-CM | POA: Insufficient documentation

## 2014-09-10 DIAGNOSIS — F1420 Cocaine dependence, uncomplicated: Secondary | ICD-10-CM | POA: Insufficient documentation

## 2014-09-10 DIAGNOSIS — F332 Major depressive disorder, recurrent severe without psychotic features: Secondary | ICD-10-CM | POA: Insufficient documentation

## 2014-09-10 MED ORDER — VENLAFAXINE HCL ER 75 MG PO CP24: 225.0000 mg | ORAL_CAPSULE | Freq: Every day | ORAL | Status: DC

## 2014-09-10 MED ORDER — ALBUTEROL SULFATE HFA 108 (90 BASE) MCG/ACT IN AERS: 1.0000 | INHALATION_SPRAY | RESPIRATORY_TRACT | Status: DC | PRN

## 2014-09-10 MED ORDER — ARIPIPRAZOLE 2 MG PO TABS: 2.0000 mg | ORAL_TABLET | Freq: Every day | ORAL | Status: DC

## 2014-09-10 MED ORDER — HYDROXYZINE HCL 50 MG PO TABS: 50.0000 mg | ORAL_TABLET | Freq: Every day | ORAL | Status: DC

## 2014-09-10 MED ORDER — TRAZODONE HCL 150 MG PO TABS: 150.0000 mg | ORAL_TABLET | Freq: Every day | ORAL | Status: DC

## 2014-09-10 MED ORDER — GABAPENTIN 300 MG PO CAPS
600.0000 mg | ORAL_CAPSULE | Freq: Three times a day (TID) | ORAL | Status: DC
Start: 2014-09-10 — End: 2014-10-28
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
## 2014-09-10 NOTE — Progress Notes (Signed)
Patient up and visible in milieu. States he is ready for discharge. Denies all psychiatric complaints. No pain or physical problems. Reviewed patient's status with MD and tx team. Offered support for patient and praised for his progress. Medicated per orders without difficulty. Denies SI/HI/AVH and remains safe. Lawrence MarseillesFriedman, Leeanna Slaby Eakes

## 2014-09-10 NOTE — Progress Notes (Signed)
Patient states he is ready for discharge. Feels improved though does indicate slight depression. "That's not unusual for me. I'm looking forward to seeing my girl. She's going to meet me." Discharge teaching explained, samples provided along with Rx's. Belongings returned. Patient denies SI/HI/AV and is d/c with bus passes and bus fare in stable condition. Lawrence MarseillesFriedman, Lorain Fettes Eakes

## 2014-09-10 NOTE — Progress Notes (Signed)
Writer spoke with pt early in the shift, and he was upset that he had been told he would be discharged on Friday.  Pt says he has nowhere to go and is feeling hopeless and helpless.  He also told Clinical research associatewriter that he was having passive SI, but he could contract for safety at this time.  At other times, pt has been in the dayroom socializing and joking with peers.  He also attended [employer]oke tonight in [employer] [facility].  After returning to the unit, pt has spent a good amount of time on the phone talking/arguing with the person on the other end of the line.  Pt has been cooperative with staff thus far this evening.  Pt was encouraged to discuss his concerns with the MD tomorrow.  Support and encouragement offered.  Safety maintained with q15 minute checks.

## 2014-09-11 ENCOUNTER — Encounter (HOSPITAL_COMMUNITY): Payer: Self-pay

## 2014-09-11 ENCOUNTER — Emergency Department (HOSPITAL_COMMUNITY)
Admission: EM | Admit: 2014-09-11 | Discharge: 2014-09-12 | Disposition: A | Discharge status: Home / Self Care | Payer: Medicaid Other | Attending: Emergency Medicine | Admitting: Emergency Medicine

## 2014-09-11 DIAGNOSIS — F209 Schizophrenia, unspecified: Secondary | ICD-10-CM | POA: Insufficient documentation

## 2014-09-11 DIAGNOSIS — F1210 Cannabis abuse, uncomplicated: Secondary | ICD-10-CM | POA: Insufficient documentation

## 2014-09-11 DIAGNOSIS — F331 Major depressive disorder, recurrent, moderate: Secondary | ICD-10-CM

## 2014-09-11 DIAGNOSIS — J45909 Unspecified asthma, uncomplicated: Secondary | ICD-10-CM | POA: Insufficient documentation

## 2014-09-11 DIAGNOSIS — Z79899 Other long term (current) drug therapy: Secondary | ICD-10-CM | POA: Insufficient documentation

## 2014-09-11 DIAGNOSIS — F332 Major depressive disorder, recurrent severe without psychotic features: Secondary | ICD-10-CM | POA: Insufficient documentation

## 2014-09-11 DIAGNOSIS — R45850 Homicidal ideations: Secondary | ICD-10-CM

## 2014-09-11 DIAGNOSIS — Z720 Tobacco use: Secondary | ICD-10-CM | POA: Diagnosis not present

## 2014-09-11 DIAGNOSIS — R45851 Suicidal ideations: Secondary | ICD-10-CM | POA: Diagnosis present

## 2014-09-11 DIAGNOSIS — F3130 Bipolar disorder, current episode depressed, mild or moderate severity, unspecified: Secondary | ICD-10-CM | POA: Diagnosis not present

## 2014-09-11 LAB — COMPREHENSIVE METABOLIC PANEL
ALK PHOS: 79 U/L (ref 39–117)
ALT: 26 U/L (ref 0–53)
AST: 31 U/L (ref 0–37)
Albumin: 4.4 g/dL (ref 3.5–5.2)
Anion gap: 14 (ref 5–15)
BILIRUBIN TOTAL: 0.7 mg/dL (ref 0.3–1.2)
BUN: 12 mg/dL (ref 6–23)
CHLORIDE: 96 meq/L (ref 96–112)
CO2: 27 meq/L (ref 19–32)
Calcium: 9.6 mg/dL (ref 8.4–10.5)
Creatinine, Ser: 0.64 mg/dL (ref 0.50–1.35)
GFR calc non Af Amer: >90 mL/min (ref >90)
GLUCOSE: 88 mg/dL (ref 70–99)
POTASSIUM: 4.1 meq/L (ref 3.7–5.3)
SODIUM: 137 meq/L (ref 137–147)
TOTAL PROTEIN: 7.7 g/dL (ref 6.0–8.3)

## 2014-09-11 LAB — RAPID URINE DRUG SCREEN, HOSP PERFORMED
Amphetamines: NOT DETECTED
BARBITURATES: NOT DETECTED
Benzodiazepines: NOT DETECTED
Cocaine: NOT DETECTED
OPIATES: NOT DETECTED
TETRAHYDROCANNABINOL: POSITIVE — AB

## 2014-09-11 LAB — ACETAMINOPHEN LEVEL

## 2014-09-11 LAB — CBC
HEMATOCRIT: 39.8 % (ref 39.0–52.0)
Hemoglobin: 13.2 g/dL (ref 13.0–17.0)
MCH: 28.6 pg (ref 26.0–34.0)
MCHC: 33.2 g/dL (ref 30.0–36.0)
MCV: 86.3 fL (ref 78.0–100.0)
Platelets: 173 10*3/uL (ref 150–400)
RBC: 4.61 MIL/uL (ref 4.22–5.81)
RDW: 13.3 % (ref 11.5–15.5)
WBC: 7.7 10*3/uL (ref 4.0–10.5)

## 2014-09-11 LAB — ETHANOL: Alcohol, Ethyl (B): <11 mg/dL (ref 0–11)

## 2014-09-11 LAB — SALICYLATE LEVEL: Salicylate Lvl: <2 mg/dL — ABNORMAL LOW (ref 2.8–20.0)

## 2014-09-11 MED ORDER — LORAZEPAM 1 MG PO TABS: 1.0000 mg | ORAL_TABLET | Freq: Three times a day (TID) | ORAL | Status: DC | PRN

## 2014-09-11 MED ORDER — GABAPENTIN 300 MG PO CAPS
600.0000 mg | ORAL_CAPSULE | Freq: Three times a day (TID) | ORAL | Status: DC
Start: 2014-09-11 — End: 2014-09-12
  Administered 2014-09-11 – 2014-09-12 (×3): 600 mg via ORAL
  Filled 2014-09-11 (×3): qty 2

## 2014-09-11 MED ORDER — ARIPIPRAZOLE 2 MG PO TABS
2.0000 mg | ORAL_TABLET | Freq: Every day | ORAL | Status: DC
  Administered 2014-09-11: 2 mg via ORAL
  Filled 2014-09-11: qty 1

## 2014-09-11 MED ORDER — ONDANSETRON HCL 4 MG PO TABS: 4.0000 mg | ORAL_TABLET | Freq: Three times a day (TID) | ORAL | Status: DC | PRN

## 2014-09-11 MED ORDER — ACETAMINOPHEN 325 MG PO TABS: 650.0000 mg | ORAL_TABLET | ORAL | Status: DC | PRN

## 2014-09-11 MED ORDER — TRAZODONE HCL 50 MG PO TABS
150.0000 mg | ORAL_TABLET | Freq: Every day | ORAL | Status: DC
  Administered 2014-09-11: 150 mg via ORAL
  Filled 2014-09-11 (×2): qty 1

## 2014-09-11 MED ORDER — VENLAFAXINE HCL ER 75 MG PO CP24
225.0000 mg | ORAL_CAPSULE | Freq: Every day | ORAL | Status: DC
  Administered 2014-09-12: 225 mg via ORAL
  Filled 2014-09-11 (×2): qty 1

## 2014-09-11 MED ORDER — HYDROXYZINE HCL 25 MG PO TABS
50.0000 mg | ORAL_TABLET | Freq: Every day | ORAL | Status: DC
  Administered 2014-09-11: 50 mg via ORAL
  Filled 2014-09-11: qty 2

## 2014-09-11 MED ORDER — NICOTINE 21 MG/24HR TD PT24
21.0000 mg | MEDICATED_PATCH | Freq: Every day | TRANSDERMAL | Status: DC
  Administered 2014-09-11 – 2014-09-12 (×2): 21 mg via TRANSDERMAL
  Filled 2014-09-11 (×2): qty 1

## 2014-09-11 NOTE — ED Notes (Signed)
Pt placed in wine colored scrubs at 06:50
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
## 2014-09-11 NOTE — ED Notes (Signed)
Pt given a cup of coffee with 5 sugars and 3 creams per his request; no other needs at this time, watching tv and sitter is at bedside

## 2014-09-11 NOTE — ED Notes (Signed)
SECURITY WANDED PT D/T PT STATES HAD NOT BEEN WANDED.

## 2014-09-11 NOTE — ED Notes (Signed)
MD at bedside. 

## 2014-09-11 NOTE — ED Notes (Signed)
PT REQUESTING FOR ABILIFY TO BE INCREASED. PT AWARE RN SPOKE W/TINA, AC, [facility], WHO ADVISED SHE WILL SPEAK W/PROVIDER.
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
## 2014-09-11 NOTE — ED Notes (Signed)
Given ginger ale 
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
## 2014-09-12 MED ORDER — TRAZODONE HCL 150 MG PO TABS: 150.0000 mg | ORAL_TABLET | Freq: Every day | ORAL | Status: DC

## 2014-09-12 MED ORDER — ARIPIPRAZOLE 5 MG PO TABS: 5.0000 mg | ORAL_TABLET | Freq: Every day | ORAL | Status: DC

## 2014-09-12 MED ORDER — GABAPENTIN 300 MG PO CAPS: 600.0000 mg | ORAL_CAPSULE | Freq: Three times a day (TID) | ORAL | Status: DC

## 2014-09-12 MED ORDER — VENLAFAXINE HCL ER 75 MG PO CP24: 225.0000 mg | ORAL_CAPSULE | Freq: Every day | ORAL | Status: DC

## 2014-09-12 NOTE — ED Notes (Signed)
PER JOSEPHINE, NP, [facility], WILL RECOMMEND TO EDP FOR PT TO BE D/C'D AND WILL NEED 15-30 DAYS OF MEDS. ADVISED WANDA, CM, OF HER RECOMMENDATION.
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
## 2014-09-12 NOTE — ED Notes (Signed)
Case manager and social work working with patient [place] go [place]. He would like a ride on  [place] bus to get there

## 2014-09-12 NOTE — Care Management (Signed)
09/12/14 12:46 W. Aundria Rudogers RN BSN NCM [employer] [place] [facility] [place], spoke with patient regarding train [place], patient is agreeable. [facility] [place] [facility]. Explained the instructions on how to pick up ticket at depot patient understands that this one time process. Patient verbalized understanding. No further ED CM/  ED SW needs identified  09/12/14 12:19 W. Aundria Rudogers RN BSN NCM ED CM received referral from [employer] RN on [facility] regarding medication assistance for discharge. Reviewed record patient receiving Medicaid, unable to assist. Discussed the importance of follow up care after discharge. Patient verbalized understanding. Patient also requesting [place] get [place] [facility]. Contacted in house CSW regarding request. Awaiting response.

## 2014-09-12 NOTE — ED Notes (Signed)
PT INITIALLY STATED SPOKE W/TERESA YESTERDAY RE: HELPING HIM GET INTO A SHELTER IN [place]. THEN STATES HE HAS A PLACE AT [place] HE CAN GO TO TOMORROW. STATES JUST NEEDS $3 FOR PART BUS TRANSPORTATION. STATES THIS FACILITY WILL HELP HIM GET HIS MEDS FILLED SO "ALL I NEED ARE THE PRESCRIPTIONS". STATES HE IS ALSO PLANNING TO GO TO SCHOOL THERE. CONTINUES TO DENY SI/HI AT THIS TIME.

## 2014-09-12 NOTE — Discharge Instructions (Signed)

## 2014-09-12 NOTE — ED Notes (Signed)
telepsych in progress 

## 2014-09-12 NOTE — ED Provider Notes (Signed)
Resting this am.  Awaiting psych disposition.  Medically stable.  Filed Vitals:   09/12/14 0549  BP: 113/80  Pulse: 101  Temp: 97.9 F (36.6 C)  Resp: 18     Linwood DibblesJon Naod Sweetland, MD 09/12/14 [id number]
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
## 2014-09-12 NOTE — ED Notes (Signed)
[name] Dart[name], pt is now denying SI/HI.

## 2014-09-14 NOTE — Progress Notes (Signed)
Patient Discharge Instructions:  After Visit Summary (AVS):   Faxed to:  09/14/14 Discharge Summary Note:   Faxed to:  09/14/14 Psychiatric Admission Assessment Note:   Faxed to:  09/14/14 Suicide Risk Assessment - Discharge Assessment:   Faxed to:  09/14/14 Faxed/Sent to the Next Level Care provider:  09/14/14 [facility] @ [phone]  Jerelene ReddenSheena E Dewey, 09/14/2014, 3:29 PM

## 2014-10-21 ENCOUNTER — Encounter (HOSPITAL_COMMUNITY): Payer: Self-pay | Admitting: Emergency Medicine

## 2014-10-21 ENCOUNTER — Emergency Department (HOSPITAL_COMMUNITY)
Admission: EM | Admit: 2014-10-21 | Discharge: 2014-10-22 | Disposition: A | Discharge status: Other Acute Inpatient Hospital | Payer: Medicaid Other | Attending: Emergency Medicine | Admitting: Emergency Medicine

## 2014-10-21 DIAGNOSIS — R45851 Suicidal ideations: Secondary | ICD-10-CM | POA: Diagnosis present

## 2014-10-21 DIAGNOSIS — Z590 Homelessness: Secondary | ICD-10-CM | POA: Diagnosis not present

## 2014-10-21 DIAGNOSIS — Z79899 Other long term (current) drug therapy: Secondary | ICD-10-CM | POA: Diagnosis not present

## 2014-10-21 DIAGNOSIS — J45909 Unspecified asthma, uncomplicated: Secondary | ICD-10-CM | POA: Diagnosis not present

## 2014-10-21 DIAGNOSIS — Z720 Tobacco use: Secondary | ICD-10-CM | POA: Diagnosis not present

## 2014-10-21 LAB — CBC WITH DIFFERENTIAL/PLATELET
Basophils Absolute: 0 10*3/uL (ref 0.0–0.1)
Basophils Relative: 0 % (ref 0–1)
Eosinophils Absolute: 0 10*3/uL (ref 0.0–0.7)
Eosinophils Relative: 0 % (ref 0–5)
HEMATOCRIT: 42.1 % (ref 39.0–52.0)
Hemoglobin: 13.9 g/dL (ref 13.0–17.0)
Lymphocytes Relative: 14 % (ref 12–46)
Lymphs Abs: 1.1 10*3/uL (ref 0.7–4.0)
MCH: 28.9 pg (ref 26.0–34.0)
MCHC: 33 g/dL (ref 30.0–36.0)
MCV: 87.5 fL (ref 78.0–100.0)
Monocytes Absolute: 0.5 10*3/uL (ref 0.1–1.0)
Monocytes Relative: 7 % (ref 3–12)
NEUTROS ABS: 6.5 10*3/uL (ref 1.7–7.7)
NEUTROS PCT: 79 % — AB (ref 43–77)
Platelets: 236 10*3/uL (ref 150–400)
RBC: 4.81 MIL/uL (ref 4.22–5.81)
RDW: 12.9 % (ref 11.5–15.5)
WBC: 8.2 10*3/uL (ref 4.0–10.5)

## 2014-10-21 LAB — COMPREHENSIVE METABOLIC PANEL
ALT: 33 U/L (ref 0–53)
ANION GAP: 7 (ref 5–15)
AST: 31 U/L (ref 0–37)
Albumin: 4.6 g/dL (ref 3.5–5.2)
Alkaline Phosphatase: 71 U/L (ref 39–117)
BILIRUBIN TOTAL: 0.8 mg/dL (ref 0.3–1.2)
BUN: 8 mg/dL (ref 6–23)
CHLORIDE: 100 meq/L (ref 96–112)
CO2: 31 mmol/L (ref 19–32)
Calcium: 9.7 mg/dL (ref 8.4–10.5)
Creatinine, Ser: 0.72 mg/dL (ref 0.50–1.35)
Glucose, Bld: 141 mg/dL — ABNORMAL HIGH (ref 70–99)
POTASSIUM: 3.8 mmol/L (ref 3.5–5.1)
Sodium: 138 mmol/L (ref 135–145)
Total Protein: 7.5 g/dL (ref 6.0–8.3)

## 2014-10-21 LAB — RAPID URINE DRUG SCREEN, HOSP PERFORMED
Amphetamines: NOT DETECTED
Barbiturates: NOT DETECTED
Benzodiazepines: NOT DETECTED
Cocaine: NOT DETECTED
OPIATES: NOT DETECTED
TETRAHYDROCANNABINOL: NOT DETECTED

## 2014-10-21 LAB — ACETAMINOPHEN LEVEL: Acetaminophen (Tylenol), Serum: <10 ug/mL — ABNORMAL LOW (ref 10–30)

## 2014-10-21 LAB — SALICYLATE LEVEL: Salicylate Lvl: <4 mg/dL (ref 2.8–20.0)

## 2014-10-21 LAB — ETHANOL: Alcohol, Ethyl (B): <5 mg/dL (ref 0–9)
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
## 2014-10-21 NOTE — ED Notes (Signed)
Pt. reports suicidal ideation plans to hang himself , denies hallucinations .  
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
## 2014-10-21 NOTE — BH Assessment (Signed)
[name], [name], confirms adult unit is currently at capacity. Contacted the following facilities for placement:  BED AVAILABLE, FAXED CLINICAL INFORMATION: [name], per [name], per [name], per [name], per [name], per [name], per [name], per [name]  AT CAPACITY: [name], per [facility], per [name], per [name], per [name], per [name], per [name] [name], per [name], per [name], per [name], per [name]  NO RESPONSE: [facility]  PT DECLINED: [facility]  [address] Ellis Patsy BaltimoreWarrick Jr, WisconsinLPC, [facility] Triage Specialist [phone]
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
## 2014-10-21 NOTE — ED Notes (Signed)
Staffing office notified for pt.'s sitter , pt. given purple scrub to wear , security notified to wand pt.

## 2014-10-22 ENCOUNTER — Inpatient Hospital Stay (HOSPITAL_COMMUNITY)
Admission: AD | Admit: 2014-10-22 | Discharge: 2014-10-28 | DRG: 885 | Disposition: A | Discharge status: Home / Self Care | Payer: Medicaid Other | Source: Intra-hospital | Attending: Psychiatry | Admitting: Psychiatry

## 2014-10-22 ENCOUNTER — Encounter (HOSPITAL_COMMUNITY): Payer: Self-pay | Admitting: *Deleted

## 2014-10-22 DIAGNOSIS — Z62810 Personal history of physical and sexual abuse in childhood: Secondary | ICD-10-CM | POA: Diagnosis present

## 2014-10-22 DIAGNOSIS — Z590 Homelessness: Secondary | ICD-10-CM

## 2014-10-22 DIAGNOSIS — X838XXA Intentional self-harm by other specified means, initial encounter: Secondary | ICD-10-CM | POA: Diagnosis present

## 2014-10-22 DIAGNOSIS — F332 Major depressive disorder, recurrent severe without psychotic features: Principal | ICD-10-CM | POA: Diagnosis present

## 2014-10-22 DIAGNOSIS — F17210 Nicotine dependence, cigarettes, uncomplicated: Secondary | ICD-10-CM | POA: Diagnosis present

## 2014-10-22 DIAGNOSIS — Z9114 Patient's other noncompliance with medication regimen: Secondary | ICD-10-CM | POA: Diagnosis present

## 2014-10-22 DIAGNOSIS — F4310 Post-traumatic stress disorder, unspecified: Secondary | ICD-10-CM | POA: Diagnosis present

## 2014-10-22 DIAGNOSIS — J449 Chronic obstructive pulmonary disease, unspecified: Secondary | ICD-10-CM | POA: Diagnosis present

## 2014-10-22 DIAGNOSIS — J45909 Unspecified asthma, uncomplicated: Secondary | ICD-10-CM | POA: Diagnosis present

## 2014-10-22 DIAGNOSIS — G4700 Insomnia, unspecified: Secondary | ICD-10-CM | POA: Diagnosis present

## 2014-10-22 DIAGNOSIS — F209 Schizophrenia, unspecified: Secondary | ICD-10-CM | POA: Diagnosis present

## 2014-10-22 DIAGNOSIS — F411 Generalized anxiety disorder: Secondary | ICD-10-CM | POA: Diagnosis present

## 2014-10-22 DIAGNOSIS — F322 Major depressive disorder, single episode, severe without psychotic features: Secondary | ICD-10-CM | POA: Diagnosis present

## 2014-10-22 DIAGNOSIS — R45851 Suicidal ideations: Secondary | ICD-10-CM | POA: Diagnosis present

## 2014-10-22 DIAGNOSIS — F410 Panic disorder [episodic paroxysmal anxiety] without agoraphobia: Secondary | ICD-10-CM | POA: Diagnosis present

## 2014-10-22 MED ORDER — TRAZODONE HCL 50 MG PO TABS
50.0000 mg | ORAL_TABLET | Freq: Every evening | ORAL | Status: DC | PRN
  Filled 2014-10-22: qty 1

## 2014-10-22 MED ORDER — ALBUTEROL SULFATE HFA 108 (90 BASE) MCG/ACT IN AERS: 1.0000 | INHALATION_SPRAY | RESPIRATORY_TRACT | Status: DC | PRN

## 2014-10-22 MED ORDER — VENLAFAXINE HCL ER 75 MG PO CP24
225.0000 mg | ORAL_CAPSULE | Freq: Every day | ORAL | Status: DC
  Administered 2014-10-23: 225 mg via ORAL
  Filled 2014-10-22 (×2): qty 1

## 2014-10-22 MED ORDER — GABAPENTIN 300 MG PO CAPS
600.0000 mg | ORAL_CAPSULE | Freq: Three times a day (TID) | ORAL | Status: DC
  Administered 2014-10-22 – 2014-10-24 (×5): 600 mg via ORAL
  Filled 2014-10-22 (×11): qty 2

## 2014-10-22 MED ORDER — ALBUTEROL SULFATE HFA 108 (90 BASE) MCG/ACT IN AERS
1.0000 | INHALATION_SPRAY | RESPIRATORY_TRACT | Status: DC
  Filled 2014-10-22: qty 6.7

## 2014-10-22 MED ORDER — ACETAMINOPHEN 325 MG PO TABS: 650.0000 mg | ORAL_TABLET | Freq: Four times a day (QID) | ORAL | Status: DC | PRN

## 2014-10-22 MED ORDER — ARIPIPRAZOLE 10 MG PO TABS: 5.0000 mg | ORAL_TABLET | Freq: Every day | ORAL | Status: DC

## 2014-10-22 MED ORDER — TRAZODONE HCL 100 MG PO TABS: 150.0000 mg | ORAL_TABLET | Freq: Every day | ORAL | Status: DC

## 2014-10-22 MED ORDER — LOPERAMIDE HCL 2 MG PO CAPS
4.0000 mg | ORAL_CAPSULE | ORAL | Status: DC | PRN
  Administered 2014-10-22: 4 mg via ORAL
  Filled 2014-10-22: qty 2

## 2014-10-22 MED ORDER — VENLAFAXINE HCL ER 75 MG PO CP24
225.0000 mg | ORAL_CAPSULE | Freq: Every day | ORAL | Status: DC
  Administered 2014-10-22: 225 mg via ORAL
  Filled 2014-10-22 (×2): qty 1

## 2014-10-22 MED ORDER — ALUM & MAG HYDROXIDE-SIMETH 200-200-20 MG/5ML PO SUSP
30.0000 mL | ORAL | Status: DC | PRN
  Administered 2014-10-25: 30 mL via ORAL
  Filled 2014-10-22: qty 30

## 2014-10-22 MED ORDER — ONDANSETRON HCL 4 MG PO TABS
4.0000 mg | ORAL_TABLET | Freq: Three times a day (TID) | ORAL | Status: DC | PRN
  Administered 2014-10-22 – 2014-10-23 (×2): 4 mg via ORAL
  Filled 2014-10-22 (×2): qty 1

## 2014-10-22 MED ORDER — HYDROXYZINE HCL 25 MG PO TABS: 50.0000 mg | ORAL_TABLET | Freq: Every day | ORAL | Status: DC

## 2014-10-22 MED ORDER — IBUPROFEN 200 MG PO TABS
400.0000 mg | ORAL_TABLET | Freq: Four times a day (QID) | ORAL | Status: DC | PRN
  Administered 2014-10-22: 400 mg via ORAL
  Filled 2014-10-22 (×2): qty 2

## 2014-10-22 MED ORDER — MAGNESIUM HYDROXIDE 400 MG/5ML PO SUSP: 30.0000 mL | Freq: Every day | ORAL | Status: DC | PRN

## 2014-10-22 MED ORDER — GABAPENTIN 300 MG PO CAPS
600.0000 mg | ORAL_CAPSULE | Freq: Three times a day (TID) | ORAL | Status: DC
  Administered 2014-10-22: 600 mg via ORAL
  Filled 2014-10-22: qty 2

## 2014-10-22 MED ORDER — NICOTINE 21 MG/24HR TD PT24
21.0000 mg | MEDICATED_PATCH | Freq: Every day | TRANSDERMAL | Status: DC
  Administered 2014-10-23 – 2014-10-28 (×5): 21 mg via TRANSDERMAL
  Filled 2014-10-22 (×4): qty 1
  Filled 2014-10-22 (×2): qty 14
  Filled 2014-10-22 (×4): qty 1
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
## 2014-10-22 NOTE — Tx Team (Signed)
Initial Interdisciplinary Treatment Plan   PATIENT STRESSORS: Financial difficulties Marital or family conflict Medication change or noncompliance Substance abuse   PATIENT STRENGTHS: Average or above average intelligence Communication skills General fund of knowledge Physical Health Supportive family/friends   PROBLEM LIST: Problem List/Patient Goals Date to be addressed Date deferred Reason deferred Estimated date of resolution  "I need to get back on my meds."  10/22/14           "I think I'm dope sick." 10/22/14           Depression 10/22/14     SI/HI 10/22/14                        DISCHARGE CRITERIA:  Adequate post-discharge living arrangements Improved stabilization in mood, thinking, and/or behavior Motivation to continue treatment in a less acute level of care Need for constant or close observation no longer present Reduction of life-threatening or endangering symptoms to within safe limits Verbal commitment to aftercare and medication compliance  PRELIMINARY DISCHARGE PLAN: Attend aftercare/continuing care group Outpatient therapy  PATIENT/FAMIILY INVOLVEMENT: This treatment plan has been presented to and reviewed with the patient, [name], and/or family member.  The patient and family have been given the opportunity to ask questions and make suggestions.  Lawrence Marseilles 10/22/2014, 4:08 PM

## 2014-10-22 NOTE — BH Assessment (Signed)
Writer consulted with Dr. Lynelle Doctor and there is not a need for another assessment because the patient was assessed on 10-21-2014 and is now pending placement.

## 2014-10-22 NOTE — ED Notes (Signed)
[facility] has been accepted at [facility].

## 2014-10-22 NOTE — BH Assessment (Signed)
Staff from [facility] called to say Pt has been declined.  Harlin Rain Ria Comment, [facility] Triage Specialist [phone]

## 2014-10-22 NOTE — ED Notes (Signed)
[facility] called to transport patient 

## 2014-10-22 NOTE — BH Assessment (Signed)
[facility] Assessment Progress Note   Completed [facility] Referral Form.  [facility], spoke with Madan @ 1005 and obtained [facility] Authorization Number [id number] from 10/22/14 to 10/28/14.  Randol Kern at [facility] @ 1009 to complete phone referral and Junious Dresser called back stating pt is being placed on their wait list @ 1023.  Updated TTS staff.  Casimer Lanius, MS, [facility] Licensed Professional Counselor Therapeutic Triage Specialist [facility] Phone: [phone] Fax: [phone]

## 2014-10-22 NOTE — BH Assessment (Signed)
[facility] Assessment Progress Note   Called [facility] to inform Junious Dresser, RN, that pt accepted to [facility].  He will be taken off of their wait list.  Casimer Lanius, MS, [facility] Licensed Professional Counselor Therapeutic Triage Specialist [facility] Phone: [phone] Fax: [phone]

## 2014-10-22 NOTE — ED Notes (Signed)
[facility] has arrived to transport patient 

## 2014-10-23 ENCOUNTER — Encounter (HOSPITAL_COMMUNITY): Payer: Self-pay | Admitting: Psychiatry

## 2014-10-23 DIAGNOSIS — R45851 Suicidal ideations: Secondary | ICD-10-CM

## 2014-10-23 DIAGNOSIS — F322 Major depressive disorder, single episode, severe without psychotic features: Secondary | ICD-10-CM | POA: Diagnosis present

## 2014-10-23 DIAGNOSIS — F1490 Cocaine use, unspecified, uncomplicated: Secondary | ICD-10-CM

## 2014-10-23 DIAGNOSIS — F1190 Opioid use, unspecified, uncomplicated: Secondary | ICD-10-CM

## 2014-10-23 DIAGNOSIS — F4310 Post-traumatic stress disorder, unspecified: Secondary | ICD-10-CM

## 2014-10-23 DIAGNOSIS — F1290 Cannabis use, unspecified, uncomplicated: Secondary | ICD-10-CM

## 2014-10-23 MED ORDER — HYDROXYZINE HCL 25 MG PO TABS
25.0000 mg | ORAL_TABLET | ORAL | Status: DC | PRN
  Filled 2014-10-23: qty 20

## 2014-10-23 MED ORDER — IBUPROFEN 200 MG PO TABS
400.0000 mg | ORAL_TABLET | Freq: Four times a day (QID) | ORAL | Status: DC | PRN
  Administered 2014-10-23: 400 mg via ORAL
  Filled 2014-10-23: qty 2

## 2014-10-23 MED ORDER — VENLAFAXINE HCL ER 150 MG PO CP24
150.0000 mg | ORAL_CAPSULE | Freq: Every day | ORAL | Status: DC
  Filled 2014-10-23 (×2): qty 1

## 2014-10-23 MED ORDER — ARIPIPRAZOLE 5 MG PO TABS
5.0000 mg | ORAL_TABLET | Freq: Every day | ORAL | Status: DC
  Administered 2014-10-23 – 2014-10-24 (×2): 5 mg via ORAL
  Filled 2014-10-23 (×4): qty 1

## 2014-10-23 MED ORDER — DULOXETINE HCL 20 MG PO CPEP
20.0000 mg | ORAL_CAPSULE | Freq: Every day | ORAL | Status: DC
  Administered 2014-10-23 – 2014-10-27 (×5): 20 mg via ORAL
  Filled 2014-10-23 (×8): qty 1

## 2014-10-23 MED ORDER — ASPIRIN-ACETAMINOPHEN-CAFFEINE 250-250-65 MG PO TABS
1.0000 | ORAL_TABLET | Freq: Three times a day (TID) | ORAL | Status: DC | PRN
  Administered 2014-10-23 – 2014-10-27 (×4): 1 via ORAL
  Filled 2014-10-23 (×4): qty 1

## 2014-10-23 MED ORDER — DICYCLOMINE HCL 20 MG PO TABS: 20.0000 mg | ORAL_TABLET | Freq: Four times a day (QID) | ORAL | Status: AC | PRN

## 2014-10-23 MED ORDER — NAPROXEN 500 MG PO TABS: 500.0000 mg | ORAL_TABLET | Freq: Two times a day (BID) | ORAL | Status: AC | PRN

## 2014-10-23 MED ORDER — TRAZODONE HCL 100 MG PO TABS
100.0000 mg | ORAL_TABLET | Freq: Every day | ORAL | Status: DC
  Administered 2014-10-23: 100 mg via ORAL
  Filled 2014-10-23 (×2): qty 1

## 2014-10-23 MED ORDER — METHOCARBAMOL 500 MG PO TABS: 500.0000 mg | ORAL_TABLET | Freq: Three times a day (TID) | ORAL | Status: AC | PRN

## 2014-10-23 NOTE — Progress Notes (Signed)
Patient ID: [name], male   DOB: 15-Jun-1991, 24 y.o.   MRN: [id number]   1:1 initial note: Pt placed on 1:1 per MD orders. Pt endorses SI with a plan to smother himself with a pillow or the bed sheets. Pt cannot contract for safety at this time. Pt is currently in the dayroom attending group. Pt is stable and in no distress at this time. Sitter is at a therapeutic distance from pt.

## 2014-10-23 NOTE — [facility] Suicide Risk Assessment (Signed)
   Nursing information obtained from:  Patient, Review of record Demographic factors:  Male, Adolescent or young adult, Caucasian, Low socioeconomic status, Unemployed, Living alone Current Mental Status:  Suicide plan, Suicidal ideation indicated by patient, Plan includes specific time, place, or method, Self-harm thoughts, Thoughts of violence towards others, Plan to harm others Loss Factors:  Loss of significant relationship, Financial problems / change in socioeconomic status Historical Factors:  Prior suicide attempts, Family history of suicide, Family history of mental illness or substance abuse, Impulsivity Risk Reduction Factors:    Total Time spent with patient: 45 minutes  CLINICAL FACTORS:   Alcohol/Substance Abuse/Dependencies Previous Psychiatric Diagnoses and Treatments  Psychiatric Specialty Exam: Physical Exam  ROS  Blood pressure 126/67, pulse 75, temperature 98.1 F (36.7 C), temperature source Oral, resp. rate 16, height  (1.702 m), weight 88.451 kg (195 lb), SpO2 98 %.Body mass index is 30.53 kg/(m^2).          Please see H&P.                                       Sleep:  Number of Hours: 5.5    SUICIDE RISK:   Severe:  Frequent, intense, and enduring suicidal ideation, specific plan, no subjective intent, but some objective markers of intent (i.e., choice of lethal method), the method is accessible, some limited preparatory behavior, evidence of impaired self-control, severe dysphoria/symptomatology, multiple risk factors present, and few if any protective factors, particularly a lack of social support.  PLAN OF CARE:Please see H&P.   I certify that inpatient services furnished can reasonably be expected to improve the patient's condition.  Joakim Huesman MD 10/23/2014, 11:07 AM

## 2014-10-23 NOTE — Progress Notes (Signed)
D   Pt is labile and keeps saying he is dope sick   He appears somewhat anxious but is seen in the dayroom playing cards and interacting with others A   Verbal support given   Medications administered and effectiveness monitored   Q 15 min checks R   Pt safe at present

## 2014-10-23 NOTE — Progress Notes (Signed)
Adult Psychoeducational Group Note  Date:  10/23/2014 Time:  1325  Group Topic/Focus:   Coping With Mental Health Crisis:   The purpose of this group is to help patients identify strategies for coping with mental health crisis.  Group discusses possible causes of crisis and ways to manage them effectively.  Participation Level:  Did Not Attend  Affect: n/a  Cognitive:   n/a  Insight: n/a  Engagement in Group:  n/a  Modes of Intervention:  Confrontation, Discussion, Education and Support  Additional Comments:  Pt did not attend group. Pt was in bed asleep.   Aurora Mask 10/23/2014, 3:43 PM

## 2014-10-23 NOTE — Progress Notes (Signed)
D: Pt has depressed affect and mood.  Pt has stayed in his room for the majority of the shift.  He endorses SI with a plan to "smother myself."  Pt denies HI, denies hallucinations.  He did not attend evening group.  He has cooperated with staff.  Minimal interactions with peers.   A: Medications administered per order.  Pt monitored 1:1 for safety.  Met with pt and offered support and encouragement.   R: Pt does not contract for safety.  1:1 sitter remains with pt for safety.  He reports that he will notify staff of needs and concerns.  Will continue to monitor and assess for safety.
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
## 2014-10-23 NOTE — Progress Notes (Signed)
Adult Psychoeducational Group Note  Date:  10/23/2014 Time:  12:10 AM  Group Topic/Focus:  Wrap-Up Group:   The focus of this group is to help patients review their daily goal of treatment and discuss progress on daily workbooks.  Participation Level:  Active  Participation Quality:  Appropriate and Attentive  Affect:  Appropriate  Cognitive:  Appropriate  Insight: Appropriate  Engagement in Group:  Engaged  Modes of Intervention:  Discussion  Additional Comments:  Pt stated his day was "shitty," because he is getting "dope sick." Pt stated tomorrow he wanted to work on "getting this shit over with." Pt would not elaborate.  Caswell Corwin 10/23/2014, 12:10 AM

## 2014-10-23 NOTE — Progress Notes (Signed)
Patient ID: [name], male   DOB: 02-Jul-1991, 24 y.o.   MRN: [id number]  1:1 Note: Pt is lying in bed awake. Pt consulted and endorsed HA. Pt given Ibuprofen. Pt encouraged to go to dayroom since he denied nausea for the first time today. Pt is stable and in no distress. Sitter is within therapeutic range of the pt.

## 2014-10-23 NOTE — [facility] Group Notes (Signed)
[facility] LCSW Group Therapy 10/23/2014 1:15pm  Type of Therapy and Topic: Group Therapy: Avoiding Self-Sabotaging and Enabling Behaviors   Participation Level: Minimal  Description of Group:  Learn how to identify obstacles, self-sabotaging and enabling behaviors, what are they, why do we do them and what needs do these behaviors meet? Discuss unhealthy relationships and how to have positive healthy boundaries with those that sabotage and enable. Explore aspects of self-sabotage and enabling in yourself and how to limit these self-destructive behaviors in everyday life. A scaling question is used to help patient look at where they are now in their motivation to change, from 1 to 10 (lowest to highest motivation).   Therapeutic Goals:  1. Patient will identify one obstacle that relates to self-sabotage and enabling behaviors 2. Patient will identify one personal self-sabotaging or enabling behavior they did prior to admission 3. Patient able to establish a plan to change the above identified behavior they did prior to admission:  4. Patient will demonstrate ability to communicate their needs through discussion and/or role plays.  Summary of Patient Progress:  Pt engaged minimally in group discussion, sleeping through parts of discussion. When prompted, Pt identified his substance use as an area he would like to address by seeking further treatment at discharge. Pt affect was flat and he reported that he was not feeling well.   Therapeutic Modalities:  Cognitive Behavioral Therapy  Person-Centered Therapy  Motivational Interviewing   Chad Cordial, LCSWA 10/23/2014 12:29 PM

## 2014-10-23 NOTE — Progress Notes (Signed)
Adult Psychoeducational Group Note  Date:  10/23/2014 Time:09:35   Group Topic/Focus:  Orientation:   The focus of this group is to educate the patient on the purpose and policies of crisis stabilization and provide a format to answer questions about their admission.  The group details unit policies and expectations of patients while admitted.  Participation Level:  Minimal  Participation Quality:  Resistant  Affect:  Flat  Cognitive:  Alert  Insight: Limited  Engagement in Group:  Limited and Resistant  Modes of Intervention:  Discussion, Education, Orientation and Support  Additional Comments:  Pt attended group but did not identify a goal to accomplish. Minimal interaction during group.   Aurora Mask 10/23/2014, 11:04 AM

## 2014-10-23 NOTE — Progress Notes (Signed)
Patient ID: [name], male   DOB: 10/26/1990, 24 y.o.   MRN: [id number]  1:1 Note: Pt is in bed. Pt currently endorses a migraine and SI. Pt given a cold pack and prn pain medication for HA. Pt in no distress. Sitter at bedside.

## 2014-10-23 NOTE — Plan of Care (Signed)
Problem: Diagnosis: Increased Risk For Suicide Attempt Goal: STG-Patient Will Report Suicidal Feelings to Staff Outcome: Progressing Pt reported SI to writer with a plan to "smother myself."  Pt did not contract for safety.  1:1 staff remains with pt for safety.
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
## 2014-10-24 MED ORDER — TRAZODONE HCL 150 MG PO TABS
150.0000 mg | ORAL_TABLET | Freq: Every day | ORAL | Status: DC
  Administered 2014-10-24 – 2014-10-25 (×2): 150 mg via ORAL
  Filled 2014-10-24 (×4): qty 1

## 2014-10-24 MED ORDER — GABAPENTIN 300 MG PO CAPS
600.0000 mg | ORAL_CAPSULE | ORAL | Status: DC
  Administered 2014-10-24 – 2014-10-28 (×13): 600 mg via ORAL
  Filled 2014-10-24 (×2): qty 2
  Filled 2014-10-24: qty 84
  Filled 2014-10-24 (×3): qty 2
  Filled 2014-10-24: qty 84
  Filled 2014-10-24 (×3): qty 2
  Filled 2014-10-24: qty 84
  Filled 2014-10-24: qty 2
  Filled 2014-10-24: qty 84
  Filled 2014-10-24: qty 2
  Filled 2014-10-24: qty 84
  Filled 2014-10-24 (×3): qty 2
  Filled 2014-10-24: qty 84
  Filled 2014-10-24 (×2): qty 2

## 2014-10-24 NOTE — Progress Notes (Signed)
[facility] Post 1:1 Observation Documentation  For the first (8) hours following discontinuation of 1:1 precautions, a progress note entry by nursing staff should be documented at least every 2 hours, reflecting the patient's behavior, condition, mood, and conversation.  Use the progress notes for additional entries.  Time 1:1 discontinued: 11:09 AM   Patient's Behavior: Patient is calm, cooperative; actively participating in group with the Child psychotherapist.     Patient's Condition: No s/s of distress noted   Patient's Conversation: Logical, coherent  Harold Barban E 10/24/2014, 11:13 AM

## 2014-10-24 NOTE — Progress Notes (Signed)
1:1 note- D: Pt is resting in bed with eyes closed.  Respirations even and unlabored.  No distress noted. A: 1:1 sitter remains with pt for safety.   R: Pt is safe.  Will continue 1:1 for safety.

## 2014-10-24 NOTE — Progress Notes (Addendum)
1:1 note- D: Pt is resting in bed with eyes closed.  Respirations even and unlabored.  No distress noted. A: 1:1 sitter remains with pt for safety.  R: Pt is safe.  Will continue 1:1 monitoring for safety.

## 2014-10-24 NOTE — Plan of Care (Signed)
Problem: Diagnosis: Increased Risk For Suicide Attempt Goal: STG-Patient Will Attend All Groups On The Unit Outcome: Progressing Pt attended evening group on 10/24/13. Goal: STG-Patient Will Comply With Medication Regime Outcome: Progressing Pt was compliant with scheduled medications this shift.

## 2014-10-24 NOTE — Progress Notes (Signed)
Patient ID: [name], male   DOB: [birth year], 24 y.o.   MRN: [id number]   Type of Therapy:  Psychoeducational Skills  Participation Level:  Active  Participation Quality:  Appropriate  Affect:  Appropriate  Cognitive:  Appropriate  Insight:  Appropriate  Engagement in Group:  Engaged  Modes of Intervention:  Discussion  Summary of Progress/Problems: Pt did attend self inventory group.

## 2014-10-24 NOTE — Progress Notes (Signed)
D: Patient has depressed affect and mood. He reported that he's sleeping poorly, improving appetite and ability to pay attention and low energy level. Patient rates depression and feelings of hopelessness "6". He's attending groups and in adherence with current medication regimen.  A: Support and encouragement provided to patient. Administered medications per ordering MD. Monitor Q15 minute checks for safety.  R: Patient receptive. 1:1 observation d/c'd this morning. Currently, denies SI/HI and AVH. Patient remains safe on the unit.

## 2014-10-24 NOTE — Progress Notes (Signed)
[facility] MD Progress Note  10/24/2014 10:47 AM [name]  MRN:  [id number] Subjective: Patient states 'I feel nauseous ."  Objective:Patient seen and chart reviewed.I have discussed patient with treatment team. Patient continues to be very needy and somatic,reporting aches and pains as well as nausea 2/2 withdrawal from Heroin.However patient had UDS negative ,as well as not with any objective signs of opioid withdrawal,did not notice any episodes of vomiting per sitter at bedside even though he has been complaining of "inability to keep anything down.' Discussed with patient about his continued SI ,patient reports feeling SI ,reports plan to smother self ,patient however able to contract for safety ,reports will come and ask for help if he feels suicidal again. This was confirmed with staff as well.  Patient noted to be smiling on and off ,even when he talks about his depression.Patient reports sleep as limited last night.  COWS reviewed ,score =0 ,Vital signs noted to be wnl.  Patient continues to report anxiety as well as depression about his life in general. Discussed with patient that life situations may not change in a day ,however he could change his attitude and outlook on life and start working on coping skills. Patient agreeable.    Diagnosis:   DSM5:  Primary Psychiatric Diagnosis: Major depressive disorder,recurrent ,severe without psychosis   Secondary Psychiatric Diagnosis: PTSD Stimulant use disorder,cocaine type ,severe per history Cannabis use disorder,severe per history Opioid use disorder,severe per history   Non Psychiatric Diagnosis:    Total Time spent with patient: 45 minutes   ADL's:  Intact  Sleep: Fair  Appetite:  Fair   Psychiatric Specialty Exam: Physical Exam  ROS  Blood pressure 116/69, pulse 98, temperature 98.1 F (36.7 C), temperature source Oral, resp. rate 16, height  (1.702 m), weight 88.451 kg (195 lb), SpO2 100 %.Body mass  index is 30.53 kg/(m^2).  General Appearance: Fairly Groomed  Patent attorney::  Fair  Speech:  Clear and Coherent  Volume:  Normal  Mood:  Anxious and Depressed  Affect:  Labile  Thought Process:  Coherent  Orientation:  Full (Time, Place, and Person)  Thought Content:  Rumination  Suicidal Thoughts:  Yes.  with intent/plancontracts for safety  Homicidal Thoughts:  No  Memory:  Immediate;   Fair Recent;   Fair Remote;   Fair  Judgement:  Impaired  Insight:  Shallow  Psychomotor Activity:  Normal  Concentration:  Fair  Recall:  [employer] of Knowledge:Fair  Language: Fair  Akathisia:  No  Handed:  Right  AIMS (if indicated):     Assets:  Physical Health  Sleep:  Number of Hours: 6.75   Musculoskeletal: Strength & Muscle Tone: within normal limits Gait & Station: normal Patient leans: N/A  Current Medications: Current Facility-Administered Medications  Medication Dose Route Frequency Provider Last Rate Last Dose  . albuterol (PROVENTIL HFA;VENTOLIN HFA) 108 (90 BASE) MCG/ACT inhaler 1-2 puff  1-2 puff Inhalation Q4H PRN Lindwood Qua, NP      . alum & mag hydroxide-simeth (MAALOX/MYLANTA) 200-200-20 MG/5ML suspension 30 mL  30 mL Oral Q4H PRN Lindwood Qua, NP      . ARIPiprazole (ABILIFY) tablet 5 mg  5 mg Oral QHS Jomarie Longs, MD   5 mg at 10/23/14 2113  . aspirin-acetaminophen-caffeine (EXCEDRIN MIGRAINE) per tablet 1 tablet  1 tablet Oral Q8H PRN Jomarie Longs, MD   1 tablet at 10/24/14 0947  . dicyclomine (BENTYL) tablet 20 mg  20 mg Oral Q6H PRN  Jomarie Longs, MD      . DULoxetine (CYMBALTA) DR capsule 20 mg  20 mg Oral Daily Jomarie Longs, MD   20 mg at 10/24/14 0757  . gabapentin (NEURONTIN) capsule 600 mg  600 mg Oral [id number] Joyous Gleghorn, MD      . hydrOXYzine (ATARAX/VISTARIL) tablet 25 mg  25 mg Oral Q4H PRN Jomarie Longs, MD      . ibuprofen (ADVIL,MOTRIN) tablet 400 mg  400 mg Oral Q6H PRN Jomarie Longs, MD   400 mg at 10/23/14 1811  .  loperamide (IMODIUM) capsule 4 mg  4 mg Oral PRN Beau Fanny, FNP   4 mg at 10/22/14 1803  . magnesium hydroxide (MILK OF MAGNESIA) suspension 30 mL  30 mL Oral Daily PRN Velna Hatchet May Agustin, NP      . methocarbamol (ROBAXIN) tablet 500 mg  500 mg Oral Q8H PRN Imunique Samad, MD      . naproxen (NAPROSYN) tablet 500 mg  500 mg Oral BID PRN Jomarie Longs, MD      . nicotine (NICODERM CQ - dosed in mg/24 hours) patch 21 mg  21 mg Transdermal Q0600 Lindwood Qua, NP   21 mg at 10/24/14 0757  . ondansetron (ZOFRAN) tablet 4 mg  4 mg Oral Q8H PRN Beau Fanny, FNP   4 mg at 10/23/14 1136  . traZODone (DESYREL) tablet 150 mg  150 mg Oral QHS Jomarie Longs, MD        Lab Results: No results found for this or any previous visit (from the past 48 hour(s)).  Physical Findings: AIMS: Facial and Oral Movements Muscles of Facial Expression: None, normal Lips and Perioral Area: None, normal Jaw: None, normal Tongue: None, normal,Extremity Movements Upper (arms, wrists, hands, fingers): None, normal Lower (legs, knees, ankles, toes): None, normal, Trunk Movements Neck, shoulders, hips: None, normal, Overall Severity Severity of abnormal movements (highest score from questions above): None, normal Incapacitation due to abnormal movements: None, normal Patient's awareness of abnormal movements (rate only patient's report): No Awareness, Dental Status Current problems with teeth and/or dentures?: No Does patient usually wear dentures?: No  CIWA:    COWS:  COWS Total Score: 0  Treatment Plan Summary: Daily contact with patient to assess and evaluate symptoms and progress in treatment Medication management  Plan:  Continue Cymbalta 20 mg po for depression.Will change to take with breakfast ,due to nausea.Will not increase dose today. Will conitnue Abilify 5 mg po for AH as well as paranoia. Will increase Trazodone to 150 mg po qhs for sleep and Vistaril prn for anxiety sx. Change  Gabapentin 600 mg po tid to take Pm dose at qhs  for pain as well as anxiety sx as well as sleep issues.   Will continue to monitor vitals ,medication compliance and treatment side effects while patient is here.  Will monitor for medical issues as well as call consult as needed.  Reviewed labs ,wil order as needed. CSW will start working on disposition.  Patient to participate in therapeutic milieu .     Medical Decision Making Problem Points:  Established problem, worsening (2), Review of last therapy session (1) and Review of psycho-social stressors (1) Data Points:  Review or order medicine tests (1) Review of medication regiment & side effects (2) Review of new medications or change in dosage (2)  I certify that inpatient services furnished can reasonably be expected to improve the patient's condition.   Maryellen Dowdle 10/24/2014, 10:47 AM

## 2014-10-24 NOTE — Progress Notes (Signed)
[facility] Post 1:1 Observation Documentation  For the first (8) hours following discontinuation of 1:1 precautions, a progress note entry by nursing staff should be documented at least every 2 hours, reflecting the patient's behavior, condition, mood, and conversation.  Use the progress notes for additional entrie  Patient's Behavior: Cooperative  Patient's Condition: Anxious affect and mood   Patient's Conversation: Logical, coherent  Melanee Spry 10/24/2014, 5:03 PM

## 2014-10-24 NOTE — Progress Notes (Signed)
1:1 note- D: Pt is resting in bed with eyes closed.  Respirations even and unlabored. A: 1:1 sitter remains with pt for safety. R: Pt is safe.  No distress noted.  Will continue to monitor 1:1 for safety.

## 2014-10-24 NOTE — Progress Notes (Signed)
[facility] Post 1:1 Observation Documentation  For the first (8) hours following discontinuation of 1:1 precautions, a progress note entry by nursing staff should be documented at least every 2 hours, reflecting the patient's behavior, condition, mood, and conversation.  Use the progress notes for additional entries.  Patient's Behavior: Talking on the phone at this time, calm  Patient's Condition: Anxious  Patient's Conversation: Logical, coherent  Melanee Spry 10/24/2014, 7:06 PM

## 2014-10-24 NOTE — [facility] Group Notes (Signed)
[facility] LCSW Group Therapy  10/24/2014   11:00 AM   Type of Therapy:  Group Therapy  Participation Level:  Active  Participation Quality:  Appropriate and Attentive  Affect:  Appropriate, Calm  Cognitive:  Alert and Appropriate  Insight:  Developing/Improving and Engaged  Engagement in Therapy:  Developing/Improving and Engaged  Modes of Intervention:  Clarification, Confrontation, Discussion, Education, Exploration, Limit-setting, Orientation, Problem-solving, Rapport Building, Dance movement psychotherapist, Socialization and Support  Summary of Progress/Problems: The main focus of today's process group was to identify the patient's current support system and decide on other supports that can be put in place.  An emphasis was placed on using counselor, doctor, therapy groups, 12-step groups, and problem-specific support groups to expand supports, as well as doing something different than has been done before. Pt shared that he has no support system but plans to cut out the negative friends that encourage him to use or engage in criminal behavior.  Pt was encouraged by peers to try [facility] for support but pt shot it down, stating he doesn't like groups and doesn't trust others.  Pt discussed needing help, as he is homeless but when programs were suggested he shot them down for various reasons.  A peer confronted pt on this, telling him that if he wanted help he couldn't be picky and that it wouldn't always be easy and would require work.  Pt seemed receptive to this feedback, stating that he needs a positive role model like this peer in his life to guide him.  Pt actively participated and was engaged in group discussion.     Reyes Ivan, LCSW 10/24/2014 1:44 PM

## 2014-10-24 NOTE — [facility] Group Notes (Signed)
[facility] Group Notes:  (Nursing/MHT/Case Management/Adjunct)  Date:  10/24/2014  Time:  1:54 PM  Type of Therapy:  Psychoeducational Skills  Participation Level:  Active  Participation Quality:  Appropriate  Affect:  Appropriate  Cognitive:  Appropriate  Insight:  Appropriate  Engagement in Group:  Engaged  Modes of Intervention:  Discussion  Summary of Progress/Problems: Pt did attend healthy support systems group.     Jacquelyne Balint Shanta 10/24/2014, 1:54 PM

## 2014-10-24 NOTE — Progress Notes (Signed)
[facility] Post 1:1 Observation Documentation  For the first (8) hours following discontinuation of 1:1 precautions, a progress note entry by nursing staff should be documented at least every 2 hours, reflecting the patient's behavior, condition, mood, and conversation.  Use the progress notes for additional entries.   Patient's Behavior: Anxious affect and mood  Patient's Condition: Alert, oriented; cooperative  Patient's Conversation: Logical, interacting with peers in the dayroom at this time  Melanee Spry 10/24/2014, 1:00 PM

## 2014-10-24 NOTE — Progress Notes (Signed)
[facility] Post 1:1 Observation Documentation  For the first (8) hours following discontinuation of 1:1 precautions, a progress note entry by nursing staff should be documented at least every 2 hours, reflecting the patient's behavior, condition, mood, and conversation.  Use the progress notes for additional entries.   Patient's Behavior:  Appropriate to circumstance  Patient's Condition: Calm, alert, cooperative  Patient's Conversation: Conversing in dayroom with peers on the hall   [place], [place] E 10/24/2014, 3:00 PM
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
## 2014-10-24 NOTE — Progress Notes (Signed)
Adult Psychoeducational Group Note  Date:  10/24/2014 Time:  11:12 PM  Group Topic/Focus:  Wrap-Up Group:   The focus of this group is to help patients review their daily goal of treatment and discuss progress on daily workbooks.  Participation Level:  Active  Participation Quality:  Appropriate  Affect:  Appropriate  Cognitive:  Appropriate  Insight: Appropriate  Engagement in Group:  Engaged  Modes of Intervention:  Discussion  Additional Comments:  Pt stated he had a good day today. Tomorrow he will work on his after care and discharge plans.  Caswell Corwin 10/24/2014, 11:12 PM

## 2014-10-24 NOTE — Progress Notes (Signed)
D: Pt has labile affect and mood.  Pt is smiling and joking with peers at times.  Pt reports his goal today was to "try to get out of my depression."  Pt reports "my mood has been up and down, up and down."  Pt reports he attended groups today.  He denies SI at this time, stating "right now I don't feel suicidal, but that could change."  Pt reports plan is to  "smother myself with a pillow, blanket, anything I can get my hands on."  Pt verbally contracts for safety, reporting that he will not act on thoughts of suicide or self-harm and that he will notify staff if he has thoughts of suicide or self-harm.  Pt reports HI towards his ex-girlfriend and verbally contracts for safety here, stating "but I don't know about when I get out of here."  Pt denies hallucinations, denies pain.   A: Met with pt 1:1 and provided support and encouragement.  Safety maintained.  Medications administered per order.   R: Pt is compliant with medications.  He verbally contracts for safety.  He reports he will notify staff of needs and concerns.  Will continue to monitor and assess for safety.

## 2014-10-24 NOTE — Progress Notes (Signed)
1:1 Nursing Note  D: Patient is in his room unpacking and conversing with the MHT/RN sitter; no s/s of distress noted.  A: Monitor 1:1 observation and Q15 minute checks for safety.  R: Patient remains safe on the unit.

## 2014-10-24 NOTE — Progress Notes (Signed)
Adult Psychoeducational Group Note  Date:  10/24/2014 Time:  1:01 AM  Group Topic/Focus:  Wrap-Up Group:   The focus of this group is to help patients review their daily goal of treatment and discuss progress on daily workbooks.  Participation Level:  Did Not Attend  Participation Quality:  n/a  Affect:  n/a  Cognitive:  n/a  Insight: None  Engagement in Group:  None  Modes of Intervention:  Discussion  Additional Comments:  Pt was in his room sleeping at the time of group.  Everrett Coombe C 10/24/2014, 1:01 AM

## 2014-10-25 MED ORDER — QUETIAPINE FUMARATE 50 MG PO TABS
150.0000 mg | ORAL_TABLET | Freq: Every day | ORAL | Status: DC
  Administered 2014-10-25 – 2014-10-27 (×3): 150 mg via ORAL
  Filled 2014-10-25 (×7): qty 1

## 2014-10-25 MED ORDER — QUETIAPINE FUMARATE 50 MG PO TABS
50.0000 mg | ORAL_TABLET | Freq: Every morning | ORAL | Status: DC
  Administered 2014-10-26 – 2014-10-28 (×3): 50 mg via ORAL
  Filled 2014-10-25: qty 1
  Filled 2014-10-25 (×2): qty 14
  Filled 2014-10-25 (×4): qty 1

## 2014-10-25 NOTE — Progress Notes (Signed)
Adult Psychoeducational Group Note  Date:  10/25/2014 Time:  2:05 PM  Group Topic/Focus:  Wellness Toolbox:   The focus of this group is to discuss various aspects of wellness, balancing those aspects and exploring ways to increase the ability to experience wellness.  Patients will create a wellness toolbox for use upon discharge.  Participation Level:  Active  Participation Quality:  Appropriate and Attentive  Affect:  Appropriate  Cognitive:  Alert and Appropriate  Insight: Appropriate  Engagement in Group:  Engaged  Modes of Intervention:  Education and Support  Additional Comments:  Pt participated in group. Pt stated that staying on his medications will help him maintain his wellness.     Marquis Lunch, Jesyca Weisenburger 10/25/2014, 2:05 PM

## 2014-10-25 NOTE — Progress Notes (Signed)
Adult Psychoeducational Group Note  Date:  10/25/2014 Time:  8:50 PM  Group Topic/Focus:  Wrap-Up Group:   The focus of this group is to help patients review their daily goal of treatment and discuss progress on daily workbooks.  Participation Level:  Active  Participation Quality:  Appropriate  Affect:  Appropriate  Cognitive:  Appropriate  Insight: Appropriate  Engagement in Group:  Engaged  Modes of Intervention:  Discussion  Additional Comments:  Pt goal was to get medicine elevated where it needs to be. Pt stated MD did put him on Seroquel and he's hoping that it helps.   Casilda Carls 10/25/2014, 8:50 PM
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
## 2014-10-26 DIAGNOSIS — R45850 Homicidal ideations: Secondary | ICD-10-CM

## 2014-10-26 DIAGNOSIS — F1590 Other stimulant use, unspecified, uncomplicated: Secondary | ICD-10-CM

## 2014-10-26 LAB — HEPATITIS C ANTIBODY: HCV Ab: NEGATIVE

## 2014-10-26 LAB — HEPATITIS B SURFACE ANTIGEN: HEP B S AG: NEGATIVE

## 2014-10-26 LAB — HIV ANTIBODY (ROUTINE TESTING W REFLEX): HIV: NONREACTIVE

## 2014-10-26 MED ORDER — TRAZODONE HCL 100 MG PO TABS
100.0000 mg | ORAL_TABLET | Freq: Every evening | ORAL | Status: DC | PRN
  Administered 2014-10-26: 100 mg via ORAL
  Filled 2014-10-26: qty 1
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
## 2014-10-26 NOTE — Progress Notes (Signed)
D:Pt presents animated. Pt anxious and attention seeking throughout the day. Pt reports passive SI/HI. Pt verbally contracts for safety. Pt observed laughing and joking around throughout the day with other pts. Pt can be inappropriate throughout the day, cursing and being loud. Pt compliant with taking meds and attending groups.  A: Medications administered as ordered per MD. Verbal support given. Pt encouraged to attend groups. Pt requires redirecting for inappropriate behaviors. 15 minute checks performed for safety.  R: Pt has poor insight for treatment. Pt continues to be negative when asked to use his coping skills.
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
## 2014-10-26 NOTE — [facility] Group Notes (Signed)
[facility] Group Notes:  (Counselor/Nursing/MHT/Case Management/Adjunct)  10/26/2014 1:15PM  Type of Therapy:  Group Therapy  Participation Level:  Active  Participation Quality:  Appropriate  Affect:  Flat  Cognitive:  Oriented  Insight:  Improving  Engagement in Group:  Limited  Engagement in Therapy:  Limited  Modes of Intervention:  Discussion, Exploration and Socialization  Summary of Progress/Problems: The topic for group was balance in life.  Pt participated in the discussion about when their life was in balance and out of balance and how this feels.  Pt discussed ways to get back in balance and short term goals they can work on to get where they want to be.  Dramatically described his anger and angry thoughts and lashing out physically at others with his fists.  When other group members pointed out that this will only lead to jail or prison, and he will not be there for his daughter, he dismissed their advice.  "If you cross me or disrespect me, I will let you know it.  She would just have to learn to live without me."   [name], Banita Lehn B 10/26/2014 10:50 AM

## 2014-10-26 NOTE — Progress Notes (Signed)
Adult Psychoeducational Group Note  Date:  10/26/2014 Time:  9:26 PM  Group Topic/Focus:  Wrap-Up Group:   The focus of this group is to help patients review their daily goal of treatment and discuss progress on daily workbooks.  Participation Level:  Active  Participation Quality:  Appropriate  Affect:  Appropriate  Cognitive:  Appropriate  Insight: Good  Engagement in Group:  Engaged  Modes of Intervention:  Discussion  Additional Comments:  Pt goal was to  try not to get angry or aggressive. Pt has accomplish his goal for the day.  Casilda CarlsKELLY, Brigette Hopfer H 10/26/2014, 9:26 PM

## 2014-10-26 NOTE — Clinical Social Work Note (Signed)
Contacted pt's ex girlfriend, Trudee Gripancy Roberts, [phone], based on "duty to worn" to let her know that pt had been making homicidal threats towards her since his admission, and that he would be likely d/ced this week.  Suggested she take out a 50-b.

## 2014-10-27 MED ORDER — DULOXETINE HCL 30 MG PO CPEP
30.0000 mg | ORAL_CAPSULE | Freq: Every day | ORAL | Status: DC
  Administered 2014-10-28: 30 mg via ORAL
  Filled 2014-10-27: qty 1
  Filled 2014-10-27: qty 14
  Filled 2014-10-27: qty 1
  Filled 2014-10-27: qty 14

## 2014-10-27 NOTE — Progress Notes (Signed)
D: Patient has anxious affect and mood. He did not complete the self inventory sheet today. Patient has been actively participating in group sessions; talking, laughing and joking around with peers in the dayroom; interaction and speech with others is loud, but logical. He voiced that he was happy about being discharged on tomorrow. In adherence with medication regimen.  A: Support and encouragement provided to patient. Administered medications per ordering MD. Monitor Q15 minute checks for safety.  R: Patient receptive. Denies SI/HI and AVH. Patient remains safe on the unit.

## 2014-10-27 NOTE — Progress Notes (Signed)
D: Pt denies SI/HI/AVH. Pt is pleasant and cooperative. Pt very attention seeking. Pt shows no insight into Tx. Pt wants people to believe he knows and or has more or has done more than he really has.   A: Pt was offered support and encouragement. Pt was given scheduled medications. Pt was encourage to attend groups. Q 15 minute checks were done for safety.   R:Pt attends groups and interacts well with peers and staff. Pt is taking medication. Pt has no complaints at this time .Pt receptive to treatment and safety maintained on unit.

## 2014-10-27 NOTE — Progress Notes (Signed)
Adult Psychoeducational Group Note  Date:  10/27/2014 Time:  11:29 PM  Group Topic/Focus:  Wrap-Up Group:   The focus of this group is to help patients review their daily goal of treatment and discuss progress on daily workbooks.  Participation Level:  Active  Participation Quality:  Monopolizing  Affect:  Labile  Cognitive:  Oriented  Insight: Limited  Engagement in Group:  Engaged  Modes of Intervention:  Socialization and Support  Additional Comments: Patient attended and participated in group tonight. He reports having a great day. He went to his groups and attended meals.   Lita MainsFrancis, Izel Eisenhardt [facility] 10/27/2014, 11:29 PM

## 2014-10-27 NOTE — [facility] Group Notes (Signed)
[facility]  10/27/2014 , 1:48 PM    Type of Therapy:  [employer] Presentation  Participation Level:  Active  Participation Quality:  Attentive  Affect:  Blunted  Cognitive:  Oriented  Insight:  Limited  Engagement in Therapy:  Engaged  Modes of Intervention:  Discussion, Education and Socialization  Summary of Progress/Problems:  [name] from [employer] came to present his recovery story and play the guitar.  "Can you teach me how to teach me how to play that guitar?"  An example of the kind of questions that [name] asked.  He was in and out of group a couple times as well.  [name], [name] 10/27/2014 , 1:48 PM

## 2014-10-27 NOTE — Tx Team (Signed)
  Interdisciplinary Treatment Plan Update   Date Reviewed:  10/27/2014  Time Reviewed:  1:48 PM  Progress in Treatment:   Attending groups: Yes Participating in groups: Yes Taking medication as prescribed: Yes  Tolerating medication: Yes Family/Significant other contact made: Yes  Patient understands diagnosis: Yes  Discussing patient identified problems/goals with staff: Yes  See initial care plan Medical problems stabilized or resolved: Yes Denies suicidal/homicidal ideation: Yes  In tx team Patient has not harmed self or others: Yes  For review of initial/current patient goals, please see plan of care.  Estimated Length of Stay:  Likely d/c tomorrow  Reason for Continuation of Hospitalization:   New Problems/Goals identified:  N/A  Discharge Plan or Barriers:   has a identified a friend with whom he can stay, follow up ACT team  Additional Comments:  Attendees:  Signature: Ivin BootySarama Eappen, MD 10/27/2014 1:48 PM   Signature: Richelle Itood Betty Daidone, LCSW 10/27/2014 1:48 PM  Signature: Fransisca KaufmannLaura Davis, NP 10/27/2014 1:48 PM  Signature: Harold Barbanonecia Byrd, RN 10/27/2014 1:48 PM  Signature:  10/27/2014 1:48 PM  Signature:  10/27/2014 1:48 PM  Signature:   10/27/2014 1:48 PM  Signature:    Signature:    Signature:    Signature:    Signature:    Signature:      Scribe for Treatment Team:   Richelle Itood Kenly Henckel, LCSW  10/27/2014 1:48 PM

## 2014-10-27 NOTE — Progress Notes (Signed)
Patient ID: [name], male   DOB: [birth year], 24 y.o.   MRN: [id number] [facility] MD Progress Note  10/27/2014 2:37 PM [name]  MRN:  [id number] Subjective: Patient states "I feel better today ,but I still feel HI towards my ex girlfriend.' Objective:Patient seen and chart reviewed. Patient today reports improvement in his depressive symptoms. Patient however continues to endorse HI towards his ex girlfriend ,reports that he will hurt her if he gets discharged. However when patient was told that we have to let law enforcement know about this ,if he continues to have HI on discharge , patient reported that he does not feel HI ,and that he will only do something if she comes in contact with him on discharge. Patient has been seen on unit ,interactive in groups ,very active and seen smiling and talking to peers.Patient's objective presentation does not co relate with his subjective feeling. Will continue monitor. Patient denies any side effects.  Diagnosis:   DSM5:  Primary Psychiatric Diagnosis: Major depressive disorder,recurrent ,severe without psychosis   Secondary Psychiatric Diagnosis: PTSD Stimulant use disorder,cocaine type ,severe per history Cannabis use disorder,severe per history Opioid use disorder,severe per history   Non Psychiatric Diagnosis: See pmh   Total Time spent with patient: 25 minutes    ADL's:  Fair   Sleep: improved  Appetite:  Fair   Psychiatric Specialty Exam: Physical Exam  ROS  Blood pressure 112/54, pulse 85, temperature 97.6 F (36.4 C), temperature source Oral, resp. rate 18, height  (1.702 m), weight 88.451 kg (195 lb), SpO2 100 %.Body mass index is 30.53 kg/(m^2).  General Appearance: Fairly Groomed  Patent attorney::  Good  Speech:  Normal Rate  Volume:  Normal  Mood:  depressed ,improving  Affect:  Labile  Thought Process:  Linear  Orientation:  Full (Time, Place, and Person)  Thought Content:  Rumination- denies  hallucinations, no delusions expressed   Suicidal Thoughts:  No    Homicidal Thoughts:  Yes- describes homicidal ideations towards ex girlfriend  Memory:  Recent and remote grossly intact   Judgement:  Impaired  Insight:  Shallow  Psychomotor Activity:  Normal- no psychomotor agitation  Concentration:  Fair  Recall:  [employer] of Knowledge:Fair  Language: Fair  Akathisia:  No  Handed:  Right  AIMS (if indicated):     Assets:  Physical Health  Sleep:  Number of Hours: 6.75   Musculoskeletal: Strength & Muscle Tone: within normal limits Gait & Station: normal Patient leans: N/A  Current Medications: Current Facility-Administered Medications  Medication Dose Route Frequency Provider Last Rate Last Dose  . albuterol (PROVENTIL HFA;VENTOLIN HFA) 108 (90 BASE) MCG/ACT inhaler 1-2 puff  1-2 puff Inhalation Q4H PRN Lindwood Qua, NP      . alum & mag hydroxide-simeth (MAALOX/MYLANTA) 200-200-20 MG/5ML suspension 30 mL  30 mL Oral Q4H PRN Lindwood Qua, NP   30 mL at 10/25/14 0819  . aspirin-acetaminophen-caffeine (EXCEDRIN MIGRAINE) per tablet 1 tablet  1 tablet Oral Q8H PRN Jomarie Longs, MD   1 tablet at 10/27/14 0832  . dicyclomine (BENTYL) tablet 20 mg  20 mg Oral Q6H PRN Jakaylah Schlafer, MD      . DULoxetine (CYMBALTA) DR capsule 20 mg  20 mg Oral Daily Jomarie Longs, MD   20 mg at 10/27/14 0831  . gabapentin (NEURONTIN) capsule 600 mg  600 mg Oral [id number] Rickita Forstner, MD   600 mg at 10/27/14 0831  . hydrOXYzine (ATARAX/VISTARIL) tablet 25 mg  25  mg Oral Q4H PRN Jomarie LongsSaramma Kara Melching, MD      . ibuprofen (ADVIL,MOTRIN) tablet 400 mg  400 mg Oral Q6H PRN Jomarie LongsSaramma Lillee Mooneyhan, MD   400 mg at 10/23/14 1811  . loperamide (IMODIUM) capsule 4 mg  4 mg Oral PRN Beau FannyJohn C Withrow, FNP   4 mg at 10/22/14 1803  . magnesium hydroxide (MILK OF MAGNESIA) suspension 30 mL  30 mL Oral Daily PRN Velna HatchetSheila May Agustin, NP      . methocarbamol (ROBAXIN) tablet 500 mg  500 mg Oral Q8H PRN Thompson Mckim,  MD      . naproxen (NAPROSYN) tablet 500 mg  500 mg Oral BID PRN Jomarie LongsSaramma Liel Rudden, MD      . nicotine (NICODERM CQ - dosed in mg/24 hours) patch 21 mg  21 mg Transdermal Q0600 Lindwood QuaSheila May Agustin, NP   21 mg at 10/26/14 [id number]  . ondansetron (ZOFRAN) tablet 4 mg  4 mg Oral Q8H PRN Beau FannyJohn C Withrow, FNP   4 mg at 10/23/14 1136  . QUEtiapine (SEROQUEL) tablet 150 mg  150 mg Oral QHS Craige CottaFernando A Cobos, MD   150 mg at 10/26/14 2127  . QUEtiapine (SEROQUEL) tablet 50 mg  50 mg Oral q morning - 10a Craige CottaFernando A Cobos, MD   50 mg at 10/27/14 1002    Lab Results:  Results for orders placed or performed during the hospital encounter of 10/22/14 (from the past 48 hour(s))  Hepatitis C antibody     Status: None   Collection Time: 10/26/14  6:15 AM  Result Value Ref Range   HCV Ab NEGATIVE NEGATIVE    Comment: Performed at [facility]  Hepatitis B surface antigen     Status: None   Collection Time: 10/26/14  6:15 AM  Result Value Ref Range   Hepatitis B Surface Ag NEGATIVE NEGATIVE    Comment: Performed at [facility]  HIV antibody     Status: None   Collection Time: 10/26/14  6:15 AM  Result Value Ref Range   HIV 1&2 Ab, 4th Generation NONREACTIVE NONREACTIVE    Comment: (NOTE) A NONREACTIVE HIV Ag/Ab result does not exclude HIV infection since the time frame for seroconversion is variable. If acute HIV infection is suspected, a HIV-1 RNA Qualitative TMA test is recommended. HIV-1/2 Antibody Diff         Not indicated. HIV-1 RNA, Qual TMA           Not indicated. PLEASE NOTE: This information has been disclosed to you from records whose confidentiality may be protected by state law. If your state requires such protection, then the state law prohibits you from making any further disclosure of the information without the specific written consent of the person to whom it pertains, or as otherwise permitted by law. A general authorization for the release of medical or other information  is NOT sufficient for this purpose. The performance of this assay has not been clinically validated in patients less than 24 years old. Performed at [facility]     Physical Findings: AIMS: Facial and Oral Movements Muscles of Facial Expression: None, normal Lips and Perioral Area: None, normal Jaw: None, normal Tongue: None, normal,Extremity Movements Upper (arms, wrists, hands, fingers): None, normal Lower (legs, knees, ankles, toes): None, normal, Trunk Movements Neck, shoulders, hips: None, normal, Overall Severity Severity of abnormal movements (highest score from questions above): None, normal Incapacitation due to abnormal movements: None, normal Patient's awareness of abnormal movements (rate only patient's report):  No Awareness, Dental Status Current problems with teeth and/or dentures?: No Does patient usually wear dentures?: No  CIWA:  CIWA-Ar Total: 1 COWS:  COWS Total Score: 1     Treatment Plan Summary: Daily contact with patient to assess and evaluate symptoms and progress in treatment Medication management  Plan: Continue inpatient treatment.  Increase Cymbalta to 30 mg po daily. Continue Seroquel 50 mgs QAM and 150 mgs QHS  Will discontinue Trazodone. Gabapentin 600 mg po TID  HIV, Hep B S Ag, Hep C Ab -non reactive.    Medical Decision Making Problem Points:  Established problem, worsening (2), Review of last therapy session (1) and Review of psycho-social stressors (1) Data Points:  Review or order clinical lab tests (1) Review of medication regiment & side effects (2) Review of new medications or change in dosage (2)  I certify that inpatient services furnished can reasonably be expected to improve the patient's condition.   Faith Branan MD 10/27/2014, 2:37 PM

## 2014-10-27 NOTE — Progress Notes (Addendum)
D: Pt denies SI/HI/AVH. Pt is pleasant and cooperative. Pt has hyper-verbal speech and tries to fit in with whoever he is talking to on the unit. Pt stated he does not know where he is going when he leaves, pt stated he has no where to go. Pt was informed to let the CSW know so he could ger some referrals to places he may be able to use. Pt stated he was in back pain 10/10 , but does not present on the unit with consistent movements of someone with back pain 10/10. Pt seen dancing on the unit pt seen making fast jerky motions.   A: Pt was offered support and encouragement. Pt was given scheduled medications. Pt was encourage to attend groups. Q 15 minute checks were done for safety.   R:Pt attends groups and interacts well with peers and staff. Pt is taking medication.Pt receptive to treatment and safety maintained on unit.

## 2014-10-28 MED ORDER — QUETIAPINE FUMARATE 50 MG PO TABS: 150.0000 mg | ORAL_TABLET | Freq: Every day | ORAL | Status: DC

## 2014-10-28 MED ORDER — QUETIAPINE FUMARATE 50 MG PO TABS
150.0000 mg | ORAL_TABLET | Freq: Every day | ORAL | Status: DC
  Filled 2014-10-28 (×2): qty 42

## 2014-10-28 MED ORDER — QUETIAPINE FUMARATE 50 MG PO TABS: 50.0000 mg | ORAL_TABLET | Freq: Every morning | ORAL | Status: DC

## 2014-10-28 MED ORDER — GABAPENTIN 300 MG PO CAPS: 600.0000 mg | ORAL_CAPSULE | ORAL | Status: DC

## 2014-10-28 MED ORDER — NICOTINE 21 MG/24HR TD PT24: 21.0000 mg | MEDICATED_PATCH | Freq: Every day | TRANSDERMAL | Status: DC

## 2014-10-28 MED ORDER — HYDROXYZINE HCL 25 MG PO TABS: 25.0000 mg | ORAL_TABLET | ORAL | Status: DC | PRN

## 2014-10-28 MED ORDER — DULOXETINE HCL 30 MG PO CPEP: 30.0000 mg | ORAL_CAPSULE | Freq: Every day | ORAL | Status: DC
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
## 2014-10-28 NOTE — Progress Notes (Signed)
[facility] Adult Case Management Discharge Plan :  Will you be returning to the same living situation after discharge: No.  Pt was planning on staying with friend, but that fell through at the last minute.  [facility].  He was offered money for [facility], but did not want to leave [place]. At discharge, do you have transportation home?:Yes,  bus pass Do you have the ability to pay for your medications:Yes,  mental health  Release of information consent forms completed and in the chart;  Patient's signature needed at discharge.  Patient to Follow up at: Follow-up Information    [facility] with [facility].   Why:  Call this number to get an appointment.  They received information about you, but did not call with an appointment before you left.   Contact information:   [address] [place]  [336] [phone]      Patient denies SI/HI:   Yes,  yes    Safety Planning and Suicide Prevention discussed:  Yes,  yes  Patient refused referral  Ida Rogueorth, Britten Seyfried B 10/28/2014, 11:26 AM
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
## 2014-10-28 NOTE — Progress Notes (Signed)
Pt d/c home as per order. Mr. [name] was picked up by outpatient care services representatives [facility] He denied pain, SI / HI when assessed. Cooperatative with d/c procedure. D/C teachings, ordered prescriptions and medication samples reviewed with pt. Understanding verbalized.  All belongings in locker 14 given to pt and belonging sheet signed in agreement to items received. No physical distress to note at the time, pt observed with bright affect, steady gait as he bid good bye to peers and staff. Safety maintained on Q 15 minutes checks as per order till time of d/c.

## 2014-10-28 NOTE — [facility] Group Notes (Signed)
[facility] Group Notes:  (Nursing/MHT/Case Management/Adjunct)  Date:  10/28/2014  Time:  9:30am  Type of Therapy:  Nurse Education - Positive Lifestyle Changes  Participation Level:  Active  Participation Quality:  Monopolizing  Affect:  Excited  Cognitive:  Oriented  Insight:  Lacking  Engagement in Group:  Distracting  Modes of Intervention:  Discussion, Education and Support  Summary of Progress/Problems: Patient was silly and distracting in group. Refused to take lifestyle packet upon discharge. "I will just rely on my ACT team for everything." Not responsive to suggestions for lifestyle changes and positive choices.   Merian CapronFriedman, Donella Pascarella [facility] 10/28/2014, 9:43 AM

## 2014-11-01 NOTE — Progress Notes (Signed)
Patient Discharge Instructions:  After Visit Summary (AVS):   Faxed to:  11/01/14 Discharge Summary Note:   Faxed to:  11/01/14 Psychiatric Admission Assessment Note:   Faxed to:  11/01/14 Suicide Risk Assessment - Discharge Assessment:   Faxed to:  11/01/14 Faxed/Sent to the Next Level Care provider:  11/01/14 [facility] to [facility] @ [phone] Jerelene ReddenSheena E [place], 11/01/2014, 2:47 PM

## 2014-11-19 ENCOUNTER — Encounter (HOSPITAL_COMMUNITY): Payer: Self-pay | Admitting: *Deleted

## 2014-11-19 ENCOUNTER — Emergency Department (HOSPITAL_COMMUNITY)
Admission: EM | Admit: 2014-11-19 | Discharge: 2014-11-20 | Disposition: A | Discharge status: Other Acute Inpatient Hospital | Payer: Medicaid Other | Source: Home / Self Care | Attending: Emergency Medicine | Admitting: Emergency Medicine

## 2014-11-19 DIAGNOSIS — R45851 Suicidal ideations: Secondary | ICD-10-CM

## 2014-11-19 LAB — COMPREHENSIVE METABOLIC PANEL
ALBUMIN: 4.7 g/dL (ref 3.5–5.2)
ALT: 18 U/L (ref 0–53)
ANION GAP: 8 (ref 5–15)
AST: 20 U/L (ref 0–37)
Alkaline Phosphatase: 75 U/L (ref 39–117)
BUN: 15 mg/dL (ref 6–23)
CALCIUM: 9.8 mg/dL (ref 8.4–10.5)
CHLORIDE: 103 mmol/L (ref 96–112)
CO2: 29 mmol/L (ref 19–32)
Creatinine, Ser: 1 mg/dL (ref 0.50–1.35)
GFR calc Af Amer: >90 mL/min (ref >90)
GFR calc non Af Amer: >90 mL/min (ref >90)
GLUCOSE: 92 mg/dL (ref 70–99)
Potassium: 4.2 mmol/L (ref 3.5–5.1)
SODIUM: 140 mmol/L (ref 135–145)
TOTAL PROTEIN: 7.6 g/dL (ref 6.0–8.3)
Total Bilirubin: 0.9 mg/dL (ref 0.3–1.2)

## 2014-11-19 LAB — CBC WITH DIFFERENTIAL/PLATELET
BASOS PCT: 0 % (ref 0–1)
Basophils Absolute: 0 10*3/uL (ref 0.0–0.1)
Eosinophils Absolute: 0 10*3/uL (ref 0.0–0.7)
Eosinophils Relative: 0 % (ref 0–5)
HCT: 41.7 % (ref 39.0–52.0)
Hemoglobin: 14.1 g/dL (ref 13.0–17.0)
LYMPHS ABS: 1.8 10*3/uL (ref 0.7–4.0)
LYMPHS PCT: 20 % (ref 12–46)
MCH: 28.9 pg (ref 26.0–34.0)
MCHC: 33.8 g/dL (ref 30.0–36.0)
MCV: 85.5 fL (ref 78.0–100.0)
MONO ABS: 0.6 10*3/uL (ref 0.1–1.0)
MONOS PCT: 7 % (ref 3–12)
Neutro Abs: 6.5 10*3/uL (ref 1.7–7.7)
Neutrophils Relative %: 73 % (ref 43–77)
PLATELETS: 229 10*3/uL (ref 150–400)
RBC: 4.88 MIL/uL (ref 4.22–5.81)
RDW: 12.5 % (ref 11.5–15.5)
WBC: 8.9 10*3/uL (ref 4.0–10.5)

## 2014-11-19 LAB — RAPID URINE DRUG SCREEN, HOSP PERFORMED
AMPHETAMINES: NOT DETECTED
BARBITURATES: NOT DETECTED
BENZODIAZEPINES: NOT DETECTED
Cocaine: NOT DETECTED
Opiates: NOT DETECTED
Tetrahydrocannabinol: POSITIVE — AB

## 2014-11-19 LAB — ETHANOL: Alcohol, Ethyl (B): <5 mg/dL (ref 0–9)

## 2014-11-19 MED ORDER — LORAZEPAM 1 MG PO TABS: 1.0000 mg | ORAL_TABLET | Freq: Three times a day (TID) | ORAL | Status: DC | PRN

## 2014-11-19 MED ORDER — ACETAMINOPHEN 325 MG PO TABS: 650.0000 mg | ORAL_TABLET | ORAL | Status: DC | PRN

## 2014-11-19 MED ORDER — ONDANSETRON HCL 4 MG PO TABS: 4.0000 mg | ORAL_TABLET | Freq: Three times a day (TID) | ORAL | Status: DC | PRN

## 2014-11-19 MED ORDER — NICOTINE 21 MG/24HR TD PT24: 21.0000 mg | MEDICATED_PATCH | Freq: Every day | TRANSDERMAL | Status: DC
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
## 2014-11-19 NOTE — ED Notes (Signed)
Sitter at bedside.

## 2014-11-19 NOTE — ED Notes (Signed)
Staffing notified of need for sitter, charge RN aware

## 2014-11-19 NOTE — ED Notes (Signed)
Security at bedside to wand patient. Pt changed into maroon scrubs.

## 2014-11-19 NOTE — ED Provider Notes (Signed)
CSN: [id number]     Arrival date & time 11/19/14  2144 History   First MD Initiated Contact with Patient 11/19/14 2208     Chief Complaint  Patient presents with  . Suicidal     (Consider location/radiation/quality/duration/timing/severity/associated sxs/prior Treatment) Patient is a 24 y.o. male presenting with mental health disorder. The history is provided by the patient. No language interpreter was used.  Mental Health Problem Presenting symptoms: suicidal thoughts   Presenting symptoms: no self mutilation   Associated symptoms comment:  Recurrent suicidal thoughts today. He has a significant history of ideation and attempt. He denies any self harm today. He called a friend and the friend brought him to the ED for further evaluation and treatment.    Past Medical History  Diagnosis Date  . Asthma   . Bipolar depression   . Schizophrenia   . Suicide attempt   . Homelessness   . H/O suicide attempt     attempted hanging, gun to head, cut self   Past Surgical History  Procedure Laterality Date  . No past surgeries     Family History  Problem Relation Age of Onset  . Drug abuse Mother   . Mental illness Mother    History  Substance Use Topics  . Smoking status: Current Every Day Smoker -- 1.50 packs/day for 13 years    Types: Cigarettes  . Smokeless tobacco: Not on file  . Alcohol Use: No    Review of Systems  Constitutional: Negative for fever and chills.  HENT: Negative.   Respiratory: Negative.   Cardiovascular: Negative.   Gastrointestinal: Negative.   Musculoskeletal: Negative.   Skin: Negative.   Neurological: Negative.   Psychiatric/Behavioral: Positive for suicidal ideas. Negative for self-injury.      Allergies  Review of patient's allergies indicates no known allergies.  Home Medications   Prior to Admission medications   Medication Sig Start Date End Date Taking? Authorizing Provider  albuterol (PROVENTIL HFA;VENTOLIN HFA) 108 (90 BASE)  MCG/ACT inhaler Inhale 1-2 puffs into the lungs every 4 (four) hours as needed for wheezing or shortness of breath. 09/10/14   Velna HatchetSheila May Agustin, NP  DULoxetine (CYMBALTA) 30 MG capsule Take 1 capsule (30 mg total) by mouth daily. 10/28/14   Velna HatchetSheila May Agustin, NP  gabapentin (NEURONTIN) 300 MG capsule Take 2 capsules (600 mg total) by mouth 3 (three) times daily at 8am, 2pm and bedtime. 10/28/14   Lindwood QuaSheila May Agustin, NP  hydrOXYzine (ATARAX/VISTARIL) 25 MG tablet Take 1 tablet (25 mg total) by mouth every 4 (four) hours as needed for anxiety. 10/28/14   Velna HatchetSheila May Agustin, NP  nicotine (NICODERM CQ - DOSED IN MG/24 HOURS) 21 mg/24hr patch Place 1 patch (21 mg total) onto the skin daily at 6 (six) AM. 10/28/14   Velna HatchetSheila May Agustin, NP  QUEtiapine (SEROQUEL) 50 MG tablet Take 3 tablets (150 mg total) by mouth at bedtime. 10/28/14   Velna HatchetSheila May Agustin, NP  QUEtiapine (SEROQUEL) 50 MG tablet Take 1 tablet (50 mg total) by mouth every morning. 10/28/14   Velna HatchetSheila May Agustin, NP   BP 130/64 mmHg  Pulse 92  Temp(Src) 98 F (36.7 C) (Oral)  Resp 18  SpO2 96% Physical Exam  Constitutional: He is oriented to person, place, and time. He appears well-developed and well-nourished.  HENT:  Head: Normocephalic.  Neck: Normal range of motion. Neck supple.  Cardiovascular: Normal rate and regular rhythm.   Pulmonary/Chest: Effort normal and breath sounds normal.  Abdominal: Soft. Bowel  sounds are normal. There is no tenderness. There is no rebound and no guarding.  Musculoskeletal: Normal range of motion.  Neurological: He is alert and oriented to person, place, and time.  Skin: Skin is warm and dry. No rash noted.  Psychiatric: His speech is normal and behavior is normal. He exhibits a depressed mood. He expresses suicidal ideation.    ED Course  Procedures (including critical care time) Labs Review Labs Reviewed  URINE RAPID DRUG SCREEN (HOSP PERFORMED) - Abnormal; Notable for the following:     Tetrahydrocannabinol POSITIVE (*)    All other components within normal limits  CBC WITH DIFFERENTIAL/PLATELET  COMPREHENSIVE METABOLIC PANEL  ETHANOL    Imaging Review No results found.   EKG Interpretation None      MDM   Final diagnoses:  None    1. SI  The patient has a history of SI and returns to ED for help with recurrent ideations. Will have TTS consult to determine appropriate disposition.    Arnoldo Hooker, PA-C 11/19/14 2346  Richardean Canal, MD 11/19/14 2352

## 2014-11-20 ENCOUNTER — Inpatient Hospital Stay (HOSPITAL_COMMUNITY)
Admission: AD | Admit: 2014-11-20 | Discharge: 2014-11-23 | DRG: 885 | Disposition: A | Discharge status: Home / Self Care | Payer: Medicaid Other | Source: Intra-hospital | Attending: Psychiatry | Admitting: Psychiatry

## 2014-11-20 ENCOUNTER — Encounter (HOSPITAL_COMMUNITY): Payer: Self-pay | Admitting: *Deleted

## 2014-11-20 DIAGNOSIS — R45851 Suicidal ideations: Secondary | ICD-10-CM | POA: Diagnosis present

## 2014-11-20 DIAGNOSIS — F1220 Cannabis dependence, uncomplicated: Secondary | ICD-10-CM | POA: Diagnosis present

## 2014-11-20 DIAGNOSIS — G4700 Insomnia, unspecified: Secondary | ICD-10-CM | POA: Diagnosis present

## 2014-11-20 DIAGNOSIS — J45909 Unspecified asthma, uncomplicated: Secondary | ICD-10-CM | POA: Diagnosis present

## 2014-11-20 DIAGNOSIS — Z830 Family history of human immunodeficiency virus [HIV] disease: Secondary | ICD-10-CM | POA: Diagnosis not present

## 2014-11-20 DIAGNOSIS — F4310 Post-traumatic stress disorder, unspecified: Secondary | ICD-10-CM | POA: Diagnosis present

## 2014-11-20 DIAGNOSIS — F419 Anxiety disorder, unspecified: Secondary | ICD-10-CM | POA: Diagnosis present

## 2014-11-20 DIAGNOSIS — F1120 Opioid dependence, uncomplicated: Secondary | ICD-10-CM

## 2014-11-20 DIAGNOSIS — F332 Major depressive disorder, recurrent severe without psychotic features: Secondary | ICD-10-CM | POA: Diagnosis not present

## 2014-11-20 DIAGNOSIS — F209 Schizophrenia, unspecified: Secondary | ICD-10-CM | POA: Diagnosis not present

## 2014-11-20 DIAGNOSIS — F17210 Nicotine dependence, cigarettes, uncomplicated: Secondary | ICD-10-CM | POA: Diagnosis present

## 2014-11-20 DIAGNOSIS — Z590 Homelessness: Secondary | ICD-10-CM

## 2014-11-20 DIAGNOSIS — F322 Major depressive disorder, single episode, severe without psychotic features: Secondary | ICD-10-CM | POA: Diagnosis present

## 2014-11-20 DIAGNOSIS — F1420 Cocaine dependence, uncomplicated: Secondary | ICD-10-CM

## 2014-11-20 DIAGNOSIS — Z720 Tobacco use: Secondary | ICD-10-CM | POA: Diagnosis not present

## 2014-11-20 DIAGNOSIS — Z79899 Other long term (current) drug therapy: Secondary | ICD-10-CM | POA: Diagnosis not present

## 2014-11-20 DIAGNOSIS — F329 Major depressive disorder, single episode, unspecified: Secondary | ICD-10-CM | POA: Diagnosis not present

## 2014-11-20 MED ORDER — DULOXETINE HCL 30 MG PO CPEP
30.0000 mg | ORAL_CAPSULE | Freq: Every day | ORAL | Status: DC
  Administered 2014-11-20 – 2014-11-22 (×3): 30 mg via ORAL
  Filled 2014-11-20 (×4): qty 1

## 2014-11-20 MED ORDER — GABAPENTIN 300 MG PO CAPS
300.0000 mg | ORAL_CAPSULE | Freq: Three times a day (TID) | ORAL | Status: DC
  Administered 2014-11-20 – 2014-11-23 (×7): 300 mg via ORAL
  Filled 2014-11-20 (×11): qty 1

## 2014-11-20 MED ORDER — HYDROXYZINE HCL 25 MG PO TABS
25.0000 mg | ORAL_TABLET | ORAL | Status: DC | PRN
  Administered 2014-11-20 – 2014-11-22 (×2): 25 mg via ORAL
  Filled 2014-11-20: qty 1
  Filled 2014-11-20: qty 6

## 2014-11-20 MED ORDER — DULOXETINE HCL 30 MG PO CPEP
ORAL_CAPSULE | ORAL | Status: AC
  Filled 2014-11-20: qty 1

## 2014-11-20 MED ORDER — QUETIAPINE FUMARATE 25 MG PO TABS
ORAL_TABLET | ORAL | Status: AC
  Filled 2014-11-20: qty 2

## 2014-11-20 MED ORDER — MAGNESIUM HYDROXIDE 400 MG/5ML PO SUSP: 30.0000 mL | Freq: Every day | ORAL | Status: DC | PRN

## 2014-11-20 MED ORDER — QUETIAPINE FUMARATE 50 MG PO TABS
150.0000 mg | ORAL_TABLET | Freq: Every day | ORAL | Status: DC
  Administered 2014-11-20 – 2014-11-22 (×3): 150 mg via ORAL
  Filled 2014-11-20 (×4): qty 1

## 2014-11-20 MED ORDER — ALUM & MAG HYDROXIDE-SIMETH 200-200-20 MG/5ML PO SUSP: 30.0000 mL | ORAL | Status: DC | PRN

## 2014-11-20 MED ORDER — ACETAMINOPHEN 325 MG PO TABS: 650.0000 mg | ORAL_TABLET | Freq: Four times a day (QID) | ORAL | Status: DC | PRN

## 2014-11-20 MED ORDER — HYDROXYZINE HCL 25 MG PO TABS
ORAL_TABLET | ORAL | Status: AC
  Filled 2014-11-20: qty 1

## 2014-11-20 MED ORDER — QUETIAPINE FUMARATE 50 MG PO TABS
50.0000 mg | ORAL_TABLET | Freq: Every morning | ORAL | Status: DC
  Administered 2014-11-20 – 2014-11-23 (×4): 50 mg via ORAL
  Filled 2014-11-20 (×5): qty 1

## 2014-11-20 NOTE — [facility] Group Notes (Signed)
[facility] Group Notes:  (Nursing/MHT/Case Management/Adjunct)  Date:  11/20/2014  Time:  9:30am  Type of Therapy:  Nurse Education - Goal setting/Coping skills  Participation Level:  None  Participation Quality:  Inattentive  Affect:  Flat  Cognitive:  Oriented  Insight:  None - patient did not speak/share  Engagement in Group:  Lacking  Modes of Intervention:  Discussion, Education and Support  Summary of Progress/Problems: discussed goal setting and reviewed the SMART process for goals. Also reviewed healthy coping skills and tools for success as applied to a real life problem solving scenario.     Lawrence MarseillesFriedman, Lateefa Crosby Eakes 11/20/2014, 4:13 PM

## 2014-11-20 NOTE — ED Notes (Signed)
[facility] request gotten pt to be transfer after 8 am.
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
## 2014-11-20 NOTE — ED Notes (Signed)
[facility] called to pick up pt at 8:30 am. Service confirmed.

## 2014-11-20 NOTE — Tx Team (Signed)
Initial Interdisciplinary Treatment Plan   PATIENT STRESSORS: Legal issue Marital or family conflict   PATIENT STRENGTHS: Ability for insight Capable of independent living Physical Health   PROBLEM LIST: Problem List/Patient Goals Date to be addressed Date deferred Reason deferred Estimated date of resolution  Depression "nothing works for my depression" 11/20/14   At D/C  Suicide risk "I was going to smother myself" 11/20/14   At D/C                                             DISCHARGE CRITERIA:  Ability to meet basic life and health needs Adequate post-discharge living arrangements Improved stabilization in mood, thinking, and/or behavior Motivation to continue treatment in a less acute level of care Need for constant or close observation no longer present Reduction of life-threatening or endangering symptoms to within safe limits Verbal commitment to aftercare and medication compliance  PRELIMINARY DISCHARGE PLAN: Outpatient therapy  PATIENT/FAMIILY INVOLVEMENT: This treatment plan has been presented to and reviewed with the patient, Jathniel Chojnowski.  The patient and family have been given the opportunity to ask questions and make suggestions.  Celene KrasRobinson, Catherene Kaleta G 11/20/2014, 10:50 PM

## 2014-11-20 NOTE — BH Assessment (Signed)
Assessment complete. Binnie RailJoann Glover, [facility]. Gave clinical report to Nanine MeansJamison Lord, NP who accepted Pt to the service of Dr. Geoffery LyonsIrving Lugo, room 404-2. Notified Dr. Krista BlueA. Oni and Trula Orehristina, RN of acceptance.  Harlin RainFord Ellis Ria CommentWarrick Jr, LPC, [facility] Triage Specialist [phone]

## 2014-11-20 NOTE — Progress Notes (Signed)
[facility] Group Notes:  (Nursing/MHT/Case Management/Adjunct)  Date:  11/20/2014  Time:  2030  Type of Therapy:  wrap up group  Participation Level:  Minimal  Participation Quality:  Attentive and Supportive  Affect:  Irritable  Cognitive:  Appropriate  Insight:  Lacking  Engagement in Group:  Limited  Modes of Intervention:  Clarification, Education and Support  Summary of Progress/Problems: Pt couldn't report anything good about the day. Pt shares that he will discharge home and expects his medicine to be changed. Pt reports no other contributing factors to his current state other than wrong medicine.   Shelah LewandowskySquires, Areesha Dehaven Carol 11/20/2014, 10:13 PM
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
## 2014-11-20 NOTE — ED Notes (Signed)
Verified w/[facility] - aware pt will need [facility].

## 2014-11-20 NOTE — Progress Notes (Signed)
Patient vol admitted after [facility]. [facility] He reports increasing depression with SI to smother himself with a pillow. States he has been med compliant however he feels the meds "just aren't working." States he has conflict with gf with whom he was living and she asked him to leave. He is uncertain if he can return. Court date 11/22/14 for misdemeanor larceny. Denies HI/AVH and contracts for safety. Reports back pain of a 10/10 but denies other problems. Oriented to unit, belongings searched and secured. Level III obs initiated. Notified Aggie, NP that admit orders needed. Patient verbalizes understanding and is safe in the dayroom with peers. Will continue to monitor closely as patient's behavior on past admissions has been impulsive and disruptive. Lawrence MarseillesFriedman, Dhamar Gregory Eakes

## 2014-11-20 NOTE — [facility] Group Notes (Signed)
[facility] Group Notes:  (Clinical Social Work)  11/20/2014     10:30-11AM  Summary of Progress/Problems:   The main focus of today's process group was to discuss what the patients would like to work on changing in their lives, and how they might go about it.    The patient stated he wants to work on his depression, anxiety and drinking.  He stated that money is an issue for him, but was extremely resistant to all suggestions.  He had to be redirected several times from side conversations he was initiating.  Type of Therapy:  Group Therapy - Process   Participation Level:  Active  Participation Quality:  Resistant  Affect:  Blunted  Cognitive:  Appropriate  Insight:  Developing/Improving  Engagement in Therapy:  Developing/Improving  Modes of Intervention:  Education, Motivational Interviewing  Ambrose MantleMareida Grossman-Orr, LCSW 11/20/2014, 2:41 PM

## 2014-11-20 NOTE — BH Assessment (Signed)
Contacted Dr. Mora Bellmanni who said to proceed with tele-assessment.  Harlin RainFord Ellis Ria CommentWarrick Jr, LPC, [facility] Triage Specialist [phone]

## 2014-11-20 NOTE — Progress Notes (Signed)
Pt alert and cooperative. Affect/mood sad and depressd. "It seems like I can never be happy or successful in my life". Passive SI, -HI, verbally contracts for safety. -A/[place]. Visible in milieu, minimum interaction with peers. Emotional support and encouragement given. Will continue to monitor closely and evaluate for stabilization.

## 2014-11-20 NOTE — Tx Team (Addendum)
Initial Interdisciplinary Treatment Plan   PATIENT STRESSORS: Legal issue Loss of breakup with gf Substance abuse   PATIENT STRENGTHS: Average or above average intelligence Communication skills General fund of knowledge   PROBLEM LIST: Problem List/Patient Goals Date to be addressed Date deferred Reason deferred Estimated date of resolution  "I need to get my meds straight. They stopped working." 11/20/14           "I just want to feel better." 11/20/14           Depression 11/20/14     SI 11/20/14                         DISCHARGE CRITERIA:  Adequate post-discharge living arrangements Improved stabilization in mood, thinking, and/or behavior Need for constant or close observation no longer present Reduction of life-threatening or endangering symptoms to within safe limits Verbal commitment to aftercare and medication compliance  PRELIMINARY DISCHARGE PLAN: Attend aftercare/continuing care group Outpatient therapy Placement in alternative living arrangements  PATIENT/FAMIILY INVOLVEMENT: This treatment plan has been presented to and reviewed with the patient, [name], and/or family member.  The patient and family have been given the opportunity to ask questions and make suggestions.  Lawrence MarseillesFriedman, Aleia Larocca Eakes 11/20/2014, 10:12 AM

## 2014-11-21 DIAGNOSIS — F4310 Post-traumatic stress disorder, unspecified: Secondary | ICD-10-CM

## 2014-11-21 DIAGNOSIS — F339 Major depressive disorder, recurrent, unspecified: Secondary | ICD-10-CM

## 2014-11-21 NOTE — Progress Notes (Signed)
D: Patient slept most of the morning, opting to skip his morning meds.  He has not attended group today.  Patient presents with pleasant mood.  Patient has been intrusive at the nurse's station.  Patient denies any SI/HI/AVH.  Patient continue to report depressive symptoms, however, has been in day room cutting up and laughing. A: Continue to monitor medication management and MD orders.  Safety checks continued every 15 minutes per protocol.  Meet 1:1 with patient to address concerns and offer encouragement. R: Patient is redirectable.
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
## 2014-11-21 NOTE — [facility] Group Notes (Signed)
[facility] Group Notes: (Clinical Social Work)   11/21/2014      Type of Therapy:  Group Therapy   Participation Level:  Did Not Attend despite prompting   Deagen Krass Grossman-Orr, LCSW 11/21/2014, 12:33 PM         

## 2014-11-21 NOTE — Progress Notes (Signed)
Patient did not attend the evening speaker AA meeting. Pt reported AA meetings make him want to drink and remained in the hall.

## 2014-11-21 NOTE — Progress Notes (Signed)
Pt alert and cooperative. Affect/mood animated and labile. Denies SI/HI to Clinical research associatewriter, verbally contracts for safety. -A/Vhall. Visible in milieu, intrusive at times and interactive with peers. Emotional support and encouragement given. Will continue to monitor closely and evaluate for stabilization.

## 2014-11-21 NOTE — [facility] Group Notes (Signed)
[facility] Group Notes:  (Nursing/MHT/Case Management/Adjunct)  Date:  11/21/2014  Time:  0930 am  Type of Therapy:  Psychoeducational Skills  Participation Level:  Did Not Attend   [name] 11/21/2014, 10:32 AM 

## 2014-11-21 NOTE — Progress Notes (Signed)
Adult Psychoeducational Group Note  Date:  11/21/2014 Time:  3:23 PM  Group Topic/Focus:  Therapeutic Activity   Participation Level:  Active  Participation Quality:  Appropriate  Affect:  Appropriate  Cognitive:  Appropriate  Insight: Good  Engagement in Group:  Developing/Improving and Engaged  Modes of Intervention:  Activity and Socialization  Elijio MilesMercer, Rendon Howell N 11/21/2014, 3:23 PM

## 2014-11-22 DIAGNOSIS — F3130 Bipolar disorder, current episode depressed, mild or moderate severity, unspecified: Secondary | ICD-10-CM

## 2014-11-22 MED ORDER — DULOXETINE HCL 20 MG PO CPEP
40.0000 mg | ORAL_CAPSULE | Freq: Every day | ORAL | Status: DC
Start: 2014-11-23 — End: 2014-11-23
  Administered 2014-11-23: 40 mg via ORAL
  Filled 2014-11-22 (×3): qty 2

## 2014-11-22 NOTE — [facility] Group Notes (Signed)
[facility]   11/22/2014 11:44 AM    Participation Quality:  Appropraite  Mood/Affect:  Appropriate  Depression Rating:  10  Anxiety Rating:  10  Thoughts of Suicide:  No  Will you contract for safety?   NA  Current AVH:  No  Plan for Discharge/Comments:  Patient attended discharge planning group and actively participated in group. Patient advised of having a disagreement with wife and attempting SI.  He plans to return home with wife and will continue follow up with [facility] ACT Team.  Suicide prevention education reviewed and SPE document provided.   Transportation Means: Patient has transportation.   Supports:  Patient has a support system.   [name], [name] 11/22/2014   11:44 AM

## 2014-11-22 NOTE — [facility] Group Notes (Signed)
[facility] Group Notes:  (Nursing/MHT/Case Management/Adjunct)  Date:  11/22/2014  Time:  9:54 PM  Type of Therapy:  Group Therapy  Participation Level:  Active  Participation Quality:  Monopolizing and Redirectable  Affect:  Anxious and Defensive  Cognitive:  Appropriate and Lacking  Insight:  Improving and Lacking  Engagement in Group:  Distracting, Engaged, Improving and Monopolizing  Modes of Intervention:  Discussion  Summary of Progress/Problems: Pt stated he had no goal, due to dr. Not putting pt on his ADHD/Depression meds. Delos Haringhillips, Malaney Mcbean A 11/22/2014, 9:54 PM

## 2014-11-22 NOTE — Progress Notes (Signed)
Pt alert, and cooperative. Affect/mood anxious and depressed. Denies SI/HI to Clinical research associatewriter, verbally contracts for safety. -A/Vhall. Visible in milieu and interactive with peers. Emotional support and encouragement given. Will continue to monitor closely and evaluate for stabilization.
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
## 2014-11-22 NOTE — Progress Notes (Signed)
D:  Patient denied SI and HI, contracts for safety.  Denied A/V hallucinations.  Denied pain. A:  Medications administered per MD orders.  Emotional support and encouragement given patient. R:  Safety maintained with 15 minute checks.  
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
## 2014-11-22 NOTE — [facility] Group Notes (Signed)
[facility] LCSW Group Therapy          Overcoming Obstacles       1:15 -2:30        11/22/2014   2:56 PM     Type of Therapy:  Group Therapy  Participation Level: Minimal  Participation Quality:  Minimal  Affect:  Depressed, Flat, Drowsy  Cognitive:   Appropriate  Insight: Developing/Improving   Engagement in Therapy: Developing/Imprvoing   Modes of Intervention:  Discussion Exploration  Education Rapport BuildingProblem-Solving Support     [name] BankerHodnett, [name] 11/22/2014   2:56 PM

## 2014-11-23 ENCOUNTER — Encounter (HOSPITAL_COMMUNITY): Payer: Self-pay | Admitting: Emergency Medicine

## 2014-11-23 ENCOUNTER — Emergency Department (HOSPITAL_COMMUNITY)
Admission: EM | Admit: 2014-11-23 | Discharge: 2014-11-24 | Disposition: A | Discharge status: Home / Self Care | Payer: Medicaid Other | Attending: Emergency Medicine | Admitting: Emergency Medicine

## 2014-11-23 DIAGNOSIS — Z720 Tobacco use: Secondary | ICD-10-CM | POA: Insufficient documentation

## 2014-11-23 DIAGNOSIS — R45851 Suicidal ideations: Secondary | ICD-10-CM

## 2014-11-23 DIAGNOSIS — F329 Major depressive disorder, single episode, unspecified: Secondary | ICD-10-CM | POA: Diagnosis not present

## 2014-11-23 DIAGNOSIS — Z79899 Other long term (current) drug therapy: Secondary | ICD-10-CM | POA: Insufficient documentation

## 2014-11-23 DIAGNOSIS — F209 Schizophrenia, unspecified: Secondary | ICD-10-CM | POA: Insufficient documentation

## 2014-11-23 DIAGNOSIS — J45909 Unspecified asthma, uncomplicated: Secondary | ICD-10-CM | POA: Insufficient documentation

## 2014-11-23 DIAGNOSIS — Z590 Homelessness: Secondary | ICD-10-CM | POA: Insufficient documentation

## 2014-11-23 LAB — COMPREHENSIVE METABOLIC PANEL
ALK PHOS: 73 U/L (ref 39–117)
ALT: 18 U/L (ref 0–53)
AST: 29 U/L (ref 0–37)
Albumin: 4.6 g/dL (ref 3.5–5.2)
Anion gap: 9 (ref 5–15)
BUN: 9 mg/dL (ref 6–23)
CALCIUM: 9.9 mg/dL (ref 8.4–10.5)
CO2: 28 mmol/L (ref 19–32)
CREATININE: 0.97 mg/dL (ref 0.50–1.35)
Chloride: 103 mmol/L (ref 96–112)
GFR calc Af Amer: >90 mL/min (ref >90)
GLUCOSE: 139 mg/dL — AB (ref 70–99)
Potassium: 3.9 mmol/L (ref 3.5–5.1)
SODIUM: 140 mmol/L (ref 135–145)
TOTAL PROTEIN: 7.5 g/dL (ref 6.0–8.3)
Total Bilirubin: 0.6 mg/dL (ref 0.3–1.2)

## 2014-11-23 LAB — CBC
HCT: 42.8 % (ref 39.0–52.0)
HEMOGLOBIN: 14.6 g/dL (ref 13.0–17.0)
MCH: 29.8 pg (ref 26.0–34.0)
MCHC: 34.1 g/dL (ref 30.0–36.0)
MCV: 87.3 fL (ref 78.0–100.0)
Platelets: 169 10*3/uL (ref 150–400)
RBC: 4.9 MIL/uL (ref 4.22–5.81)
RDW: 12.5 % (ref 11.5–15.5)
WBC: 8.2 10*3/uL (ref 4.0–10.5)

## 2014-11-23 LAB — RAPID URINE DRUG SCREEN, HOSP PERFORMED
AMPHETAMINES: NOT DETECTED
Barbiturates: NOT DETECTED
Benzodiazepines: NOT DETECTED
Cocaine: NOT DETECTED
Opiates: NOT DETECTED
Tetrahydrocannabinol: POSITIVE — AB

## 2014-11-23 LAB — SALICYLATE LEVEL: Salicylate Lvl: <4 mg/dL (ref 2.8–20.0)

## 2014-11-23 LAB — ACETAMINOPHEN LEVEL: Acetaminophen (Tylenol), Serum: <10 ug/mL — ABNORMAL LOW (ref 10–30)

## 2014-11-23 LAB — ETHANOL

## 2014-11-23 MED ORDER — ONDANSETRON HCL 4 MG PO TABS: 4.0000 mg | ORAL_TABLET | Freq: Three times a day (TID) | ORAL | Status: DC | PRN

## 2014-11-23 MED ORDER — QUETIAPINE FUMARATE 25 MG PO TABS: 50.0000 mg | ORAL_TABLET | ORAL | Status: DC

## 2014-11-23 MED ORDER — IBUPROFEN 400 MG PO TABS: 600.0000 mg | ORAL_TABLET | Freq: Three times a day (TID) | ORAL | Status: DC | PRN

## 2014-11-23 MED ORDER — GABAPENTIN 300 MG PO CAPS
300.0000 mg | ORAL_CAPSULE | Freq: Three times a day (TID) | ORAL | Status: DC
  Administered 2014-11-23 – 2014-11-24 (×2): 300 mg via ORAL
  Filled 2014-11-23 (×2): qty 1

## 2014-11-23 MED ORDER — ACETAMINOPHEN 325 MG PO TABS: 650.0000 mg | ORAL_TABLET | ORAL | Status: DC | PRN

## 2014-11-23 MED ORDER — DULOXETINE HCL 40 MG PO CPEP: 40.0000 mg | ORAL_CAPSULE | Freq: Every day | ORAL | Status: DC

## 2014-11-23 MED ORDER — HYDROXYZINE HCL 25 MG PO TABS: 25.0000 mg | ORAL_TABLET | ORAL | Status: DC | PRN

## 2014-11-23 MED ORDER — NICOTINE 21 MG/24HR TD PT24
21.0000 mg | MEDICATED_PATCH | Freq: Every day | TRANSDERMAL | Status: DC
  Administered 2014-11-23: 21 mg via TRANSDERMAL
  Filled 2014-11-23 (×2): qty 1

## 2014-11-23 MED ORDER — ALUM & MAG HYDROXIDE-SIMETH 200-200-20 MG/5ML PO SUSP: 30.0000 mL | ORAL | Status: DC | PRN

## 2014-11-23 MED ORDER — GABAPENTIN 300 MG PO CAPS: 300.0000 mg | ORAL_CAPSULE | Freq: Three times a day (TID) | ORAL | Status: DC

## 2014-11-23 MED ORDER — QUETIAPINE FUMARATE 50 MG PO TABS: ORAL_TABLET | ORAL | Status: DC

## 2014-11-23 MED ORDER — ALBUTEROL SULFATE HFA 108 (90 BASE) MCG/ACT IN AERS: 1.0000 | INHALATION_SPRAY | RESPIRATORY_TRACT | Status: DC | PRN

## 2014-11-23 MED ORDER — ZOLPIDEM TARTRATE 5 MG PO TABS: 5.0000 mg | ORAL_TABLET | Freq: Every evening | ORAL | Status: DC | PRN

## 2014-11-23 MED ORDER — DULOXETINE HCL 20 MG PO CPEP
40.0000 mg | ORAL_CAPSULE | Freq: Every day | ORAL | Status: DC
  Administered 2014-11-23 – 2014-11-24 (×2): 40 mg via ORAL
  Filled 2014-11-23 (×2): qty 2

## 2014-11-23 MED ORDER — LORAZEPAM 1 MG PO TABS: 1.0000 mg | ORAL_TABLET | Freq: Three times a day (TID) | ORAL | Status: DC | PRN

## 2014-11-23 NOTE — Progress Notes (Signed)
Pt attended spiritual care group on grief and loss facilitated by chaplain Burnis KingfisherMatthew Stalnaker and counseling intern [place] Dehlia Kilner. Group opened with brief discussion and psycho-social ed around grief and loss in relationships and in relation to self - identifying life patterns, circumstances, changes that cause losses. Established group norm of speaking from own life experience. Group goal of establishing open and affirming space for members to share loss and experience with grief, normalize grief experience and provide psycho social education and grief support.  Group drew on narrative and Alderian therapeutic modalities.   Raydan was present for the beginning of group but did not participate before leaving. Xavi did not return to group.   [place] Takera Rayl Counseling Intern
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
## 2014-11-23 NOTE — [facility] Group Notes (Signed)
The focus of this group is to educate the patient on the purpose and policies of crisis stabilization and provide a format to answer questions about their admission.  The group details unit policies and expectations of patients while admitted.  Patient attended 0900 nurse education orientation group this morning.  Patient actively participated, appropriate affect, alert, appropriate insight and engagement.  Today patient will work on 3 goals for discharge.  

## 2014-11-23 NOTE — Progress Notes (Signed)
CSW faxed referral to the following inpatient facilities:  [facility], [facility], [facility], [facility], [facility], [facility], [facility], [facility].  Will continue to seek placement.  [place] Abtsatia Emmarose Klinke, LCSWA Disposition staff 11/23/2014 11:25 PM

## 2014-11-23 NOTE — Progress Notes (Signed)
  [facility] Discharge Plan :  Will you be returning to the same living situation after discharge:  Yes,  Patient plans to return to his previous living environment. At discharge, do you have transportation home?: Yes,  Patient assisted with bus passes. Do you have the ability to pay for your medications: Yes,  Patient has [employer].  Release of information consent forms completed and in the chart;  Patient's signature needed at discharge.  Patient to Follow up at: Follow-up Information    Follow up with Cordella RegisterGina [name] [facility] ACT Team On 11/24/2014.   Why:  You are scheduled to be seen by Cordella RegisterGina [name], [facility], Wednesday, November 24, 2014 at 11AM at [facility]   Contact information:   [address] [place]   [address]  [phone]      Patient denies SI/HI: Patient no longer endorsing SI/HI or other thoughts of self harm.     Safety Planning and Suicide Prevention discussed: .Reviewed with all patients during discharge planning group  Has patient been referred to the Quitline? Patient declined referral to Quitline.  [name] BankerHodnett, [name] 11/23/2014, 11:04 AM
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
## 2014-11-23 NOTE — Progress Notes (Signed)
D:  Patient's self inventory sheet, patient slept good, sleep medication was helpful.  Good appetite, normal energy level, poor concentration.  Rated depression, hopeless and anxiety 10.  Denied withdrawals.  Denied SI.  Denied physical pain.   Pain medicaton is helpful.  Goal is to figure out what to do after discharge.  Plans to talk to SW.  No discharge plans.  No problems anticipated after discharge. A:  Medications administered per MD orders.  Emotional support and encouragement given patient. R:  Denied SI and HI, contracts for safety.  Denied A/V hallucinations.  Safety maintained with 15 minute checks.
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
## 2014-11-23 NOTE — ED Notes (Signed)
TTS called ready for tele psych. Placed machine in room performing tele psych.

## 2014-11-23 NOTE — ED Notes (Signed)
Sitter at bedside.

## 2014-11-23 NOTE — Progress Notes (Signed)
Discharge Note:  Patient discharged with bus passes.  Denied SI and HI.  Denied A/V hallucinations.  Suicide prevention information given and discussed with patient who stated he understood and had no questions.  Patient stated he received all his belongings, clothing, toiletries, medications, prescriptions.  Patient stated he [facility] all [facility].

## 2014-11-23 NOTE — BHH Suicide Risk Assessment (Signed)
[facility] INPATIENT:  Family/Significant Other Suicide Prevention Education  Suicide Prevention Education:  Contact Attempts; [name], Wife, [phone];  has been identified by the patient as the family member/significant other with whom the patient will be residing, and identified as the person(s) who will aid the patient in the event of a mental health crisis.  With written consent from the patient, two attempts were made to provide suicide prevention education, prior to and/or following the patient's discharge.  We were unsuccessful in providing suicide prevention education.  A suicide education pamphlet was given to the patient to share with family/significant other.  Date and time of first attempt:  Message left on voicemail 11/23/14 at 9AM Date and time of second attempt: Called [name] again at [employer] Noon.  [name], [name] 11/23/2014, 9:03 AM

## 2014-11-23 NOTE — Tx Team (Signed)
Interdisciplinary Treatment Plan Update   Date Reviewed:  11/23/2014  Time Reviewed:  8:58 AM  Progress in Treatment:   Attending groups: Yes, patient is attending groups. Participating in groups: Yes, patient participates in discussion Taking medication as prescribed: Yes  Tolerating medication: Yes Family/Significant other contact made:  No,but consent give for collateral contact wife.  Message left on voicemail Patient understands diagnosis: Yes, patient understands diagnosis and need for treatment. Discussing patient identified problems/goals with staff: Yes, patient is able to express goals for treatment and discharge. Medical problems stabilized or resolved: Yes Denies suicidal/homicidal ideation: Yes Patient has not harmed self or others: Yes  For review of initial/current patient goals, please see plan of care.  Estimated Length of Stay:  Discharge today  Reasons for Continued Hospitalization:   New Problems/Goals identified:    Discharge Plan or Barriers:    Additional Comments:     Patient and CSW reviewed patient's identified goals and treatment plan.  Patient verbalized understanding and agreed to treatment plan.   Attendees:  Patient:  11/23/2014 8:58 AM   Signature:  Sallyanne HaversF. Cobos, MD 11/23/2014 8:58 AM  Signature: Geoffery LyonsIrving Lugo, MD 11/23/2014 8:58 AM  Signature:  Waynetta SandyJan Wright, RN 11/23/2014 8:58 AM  Signature: Quintella ReichertBeverly Knight, RN 11/23/2014 8:58 AM  Signature:   11/23/2014 8:58 AM  Signature:  Juline PatchQuylle Elgie Landino, LCSW 11/23/2014 8:58 AM  Signature:  Belenda CruiseKristin Drinkard, LCSW-A 11/23/2014 8:58 AM  Signature:  Leisa LenzValerie Enoch, Care Coordinator [facility] 11/23/2014 8:58 AM  Signature:   11/23/2014 8:58 AM  Signature: 11/23/2014  8:58 AM  Signature:   Onnie BoerJennifer Clark, RN [facility] 11/23/2014  8:58 AM  Signature:   11/23/2014  8:58 AM    Scribe for Treatment Team:   Juline PatchQuylle Tyronda Vizcarrondo,  11/23/2014 8:58 AM

## 2014-11-23 NOTE — ED Notes (Signed)
[facility]

## 2014-11-24 NOTE — ED Notes (Signed)
Snack given.
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
## 2014-11-24 NOTE — Discharge Instructions (Signed)
Follow up as directed

## 2014-11-24 NOTE — ED Notes (Signed)
Called [facility] psych ed and spoke to [employer]. He advises they do have beds and can accept the patient. i called jen the charge nurse and she advises they cannot accept the patient because they have over 6 in the psych ed

## 2014-11-24 NOTE — ED Notes (Signed)
SPOKE TO [employer] Corporation[employer]ANNAH WITH SOCIAL WORK ABOUT PT CHALLENGES IN GETTING WITH HIS ACT TEAM. SHE IS GOING TO INVESTIGATE AND SEE WHAT CAN BE DONE TO MAKE ACT MORE EFFECTIVE FOR PATIENT

## 2014-11-24 NOTE — Progress Notes (Signed)
LCS[facility] received call from [facility], that patient never showed up for his ACT team appointment to complete PCP.  Patient did receive a taxi voucher and [facility], however unsure of where he went after that.  ACT services are currently on hold as initial assessment has not been completed.  No ACT services at this time until assessment completed.   Deretha EmoryHannah Kamyah Wilhelmsen LCSW, MSW Clinical Social Work: Emergency Room [phone]

## 2014-11-24 NOTE — Progress Notes (Addendum)
Patient to be discharged from [facility] ED.Spoke with ACT team: Dorene Sorrow, who was suppose to meet patient at 11am this morning. She is aware he is here and can see him at DC. LCSW complete taxi voucher and faxed necessary clinicals to her to complete assessment.  Patient to go to [facility] directly.    LCSW met with patient at the bedside.  Patient very appropriate with LCSW reports he remembers her from [facility] in the past.  Patient reports he will be stay at [facility] when he is discharged from the hospital.  LCSW called [facility] who report he has been there in the past, but unsure if he is able to return at this time as he was kicked out.  Someone from [facility] will follow up with Probation officer regarding disposition.   ([place] # [phone])   LCSW called patient ACT team Beverly Sessions) with regards to status of patient on team and to alert his admission to [facility] ED  Patient is currently going through the process of receiving ACT services and not part of team yet per worker.  LCSW gave information for clinician to help complete referral and obtain authorization. Message left for another clinician in effort to give more information with recent [facility] d/c and quick readmission to hospital.  Lane Hacker, MSW Clinical Social Work: Emergency Room [phone]

## 2014-11-24 NOTE — ED Notes (Signed)
Pt is in stable condition upon d/c and is [facility] [place] [facility]. Pt verbalizes understanding rt d/c instructions.

## 2014-11-26 NOTE — Progress Notes (Signed)
Patient Discharge Instructions:  After Visit Summary (AVS):   Faxed to:  11/26/14 Discharge Summary Note:   Faxed to:  11/26/14 Psychiatric Admission Assessment Note:   Faxed to:  11/26/14 Suicide Risk Assessment - Discharge Assessment:   Faxed to:  11/26/14 Faxed/Sent to the Next Level Care provider:  11/26/14 [facility]ax[facility] to [facility] @ [phone]  Jerelene ReddenSheena E Superior, 11/26/2014, 3:14 PM

## 2015-01-01 ENCOUNTER — Encounter (HOSPITAL_COMMUNITY): Payer: Self-pay | Admitting: *Deleted

## 2015-01-01 DIAGNOSIS — F1210 Cannabis abuse, uncomplicated: Secondary | ICD-10-CM | POA: Insufficient documentation

## 2015-01-01 DIAGNOSIS — J45909 Unspecified asthma, uncomplicated: Secondary | ICD-10-CM | POA: Diagnosis not present

## 2015-01-01 DIAGNOSIS — Z79899 Other long term (current) drug therapy: Secondary | ICD-10-CM | POA: Diagnosis not present

## 2015-01-01 DIAGNOSIS — R45851 Suicidal ideations: Secondary | ICD-10-CM | POA: Diagnosis present

## 2015-01-01 DIAGNOSIS — F322 Major depressive disorder, single episode, severe without psychotic features: Secondary | ICD-10-CM | POA: Insufficient documentation

## 2015-01-01 DIAGNOSIS — Z590 Homelessness: Secondary | ICD-10-CM | POA: Insufficient documentation

## 2015-01-01 DIAGNOSIS — Z720 Tobacco use: Secondary | ICD-10-CM | POA: Diagnosis not present

## 2015-01-01 DIAGNOSIS — F332 Major depressive disorder, recurrent severe without psychotic features: Secondary | ICD-10-CM | POA: Diagnosis not present

## 2015-01-01 NOTE — ED Notes (Signed)
The pt reports that he is depressed and is feeling suicidal . No drug or alcohol use

## 2015-01-01 NOTE — ED Notes (Signed)
Staffing office and the charge rn  Made aware of the need for a sitter.  Security will wand the pt  Changed into scrubs

## 2015-01-02 ENCOUNTER — Emergency Department (HOSPITAL_COMMUNITY)
Admission: EM | Admit: 2015-01-02 | Discharge: 2015-01-03 | Disposition: A | Discharge status: Home / Self Care | Payer: No Typology Code available for payment source | Attending: Emergency Medicine | Admitting: Emergency Medicine

## 2015-01-02 DIAGNOSIS — F331 Major depressive disorder, recurrent, moderate: Secondary | ICD-10-CM

## 2015-01-02 DIAGNOSIS — F322 Major depressive disorder, single episode, severe without psychotic features: Secondary | ICD-10-CM | POA: Diagnosis not present

## 2015-01-02 DIAGNOSIS — R45851 Suicidal ideations: Secondary | ICD-10-CM

## 2015-01-02 DIAGNOSIS — F332 Major depressive disorder, recurrent severe without psychotic features: Secondary | ICD-10-CM

## 2015-01-02 LAB — CBC
HCT: 38.6 % — ABNORMAL LOW (ref 39.0–52.0)
Hemoglobin: 12.7 g/dL — ABNORMAL LOW (ref 13.0–17.0)
MCH: 28.9 pg (ref 26.0–34.0)
MCHC: 32.9 g/dL (ref 30.0–36.0)
MCV: 87.7 fL (ref 78.0–100.0)
PLATELETS: 216 10*3/uL (ref 150–400)
RBC: 4.4 MIL/uL (ref 4.22–5.81)
RDW: 13 % (ref 11.5–15.5)
WBC: 9.8 10*3/uL (ref 4.0–10.5)

## 2015-01-02 LAB — COMPREHENSIVE METABOLIC PANEL
ALK PHOS: 74 U/L (ref 39–117)
ALT: 25 U/L (ref 0–53)
AST: 20 U/L (ref 0–37)
Albumin: 4.1 g/dL (ref 3.5–5.2)
Anion gap: 9 (ref 5–15)
BUN: 8 mg/dL (ref 6–23)
CALCIUM: 9.1 mg/dL (ref 8.4–10.5)
CHLORIDE: 104 mmol/L (ref 96–112)
CO2: 26 mmol/L (ref 19–32)
Creatinine, Ser: 0.77 mg/dL (ref 0.50–1.35)
GFR calc Af Amer: >90 mL/min (ref >90)
GFR calc non Af Amer: >90 mL/min (ref >90)
GLUCOSE: 87 mg/dL (ref 70–99)
Potassium: 3.3 mmol/L — ABNORMAL LOW (ref 3.5–5.1)
SODIUM: 139 mmol/L (ref 135–145)
Total Bilirubin: 0.5 mg/dL (ref 0.3–1.2)
Total Protein: 6.9 g/dL (ref 6.0–8.3)

## 2015-01-02 LAB — RAPID URINE DRUG SCREEN, HOSP PERFORMED
AMPHETAMINES: NOT DETECTED
BARBITURATES: NOT DETECTED
Benzodiazepines: NOT DETECTED
Cocaine: NOT DETECTED
Opiates: NOT DETECTED
TETRAHYDROCANNABINOL: POSITIVE — AB

## 2015-01-02 LAB — ACETAMINOPHEN LEVEL: Acetaminophen (Tylenol), Serum: <10 ug/mL — ABNORMAL LOW (ref 10–30)

## 2015-01-02 LAB — SALICYLATE LEVEL

## 2015-01-02 LAB — ETHANOL

## 2015-01-02 MED ORDER — ALBUTEROL SULFATE HFA 108 (90 BASE) MCG/ACT IN AERS: 1.0000 | INHALATION_SPRAY | RESPIRATORY_TRACT | Status: DC | PRN

## 2015-01-02 MED ORDER — LORAZEPAM 1 MG PO TABS: 1.0000 mg | ORAL_TABLET | Freq: Three times a day (TID) | ORAL | Status: DC | PRN

## 2015-01-02 MED ORDER — ACETAMINOPHEN 325 MG PO TABS: 650.0000 mg | ORAL_TABLET | ORAL | Status: DC | PRN

## 2015-01-02 MED ORDER — QUETIAPINE FUMARATE 25 MG PO TABS
50.0000 mg | ORAL_TABLET | Freq: Two times a day (BID) | ORAL | Status: DC
  Administered 2015-01-02 – 2015-01-03 (×4): 50 mg via ORAL
  Filled 2015-01-02 (×4): qty 2

## 2015-01-02 MED ORDER — ONDANSETRON HCL 4 MG PO TABS: 4.0000 mg | ORAL_TABLET | Freq: Three times a day (TID) | ORAL | Status: DC | PRN

## 2015-01-02 MED ORDER — DULOXETINE HCL 20 MG PO CPEP
40.0000 mg | ORAL_CAPSULE | Freq: Every day | ORAL | Status: DC
  Administered 2015-01-02 – 2015-01-03 (×2): 40 mg via ORAL
  Filled 2015-01-02 (×2): qty 2

## 2015-01-02 MED ORDER — NICOTINE 21 MG/24HR TD PT24
21.0000 mg | MEDICATED_PATCH | Freq: Every day | TRANSDERMAL | Status: DC
  Administered 2015-01-02: 21 mg via TRANSDERMAL
  Filled 2015-01-02 (×2): qty 1

## 2015-01-02 MED ORDER — HYDROXYZINE HCL 25 MG PO TABS: 25.0000 mg | ORAL_TABLET | ORAL | Status: DC | PRN

## 2015-01-02 MED ORDER — GABAPENTIN 300 MG PO CAPS
300.0000 mg | ORAL_CAPSULE | Freq: Three times a day (TID) | ORAL | Status: DC
  Administered 2015-01-02 – 2015-01-03 (×5): 300 mg via ORAL
  Filled 2015-01-02 (×5): qty 1
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
## 2015-01-02 NOTE — BH Assessment (Signed)
Received notification of TTS consult request. Attempted to speak with Antony MaduraKelly Humes, PA-C but ED staff said she is with a patient. To avoid delay, tele-assessment will be initiated.  Harlin RainFord Ellis Ria CommentWarrick Jr, LPC, [facility] Triage Specialist [phone]

## 2015-01-02 NOTE — ED Provider Notes (Signed)
CSN: [id number]     Arrival date & time 01/01/15  2308 History   First MD Initiated Contact with Patient 01/02/15 0008     Chief Complaint  Patient presents with  . Psychiatric Evaluation    (Consider location/radiation/quality/duration/timing/severity/associated sxs/prior Treatment) HPI Comments: Patient is a 24 year old male with a history of bipolar depression, schizophrenia, suicide attempt, and homelessness. He presents to the emergency department for worsening depression, which has been worsening over the past few weeks. Patient states that he is feeling suicidal with no plan. He denies drug or alcohol use. He states he has been compliant with his depression medications, but they are not helping him. He denies any homicidal thoughts.  The history is provided by the patient. No language interpreter was used.    Past Medical History  Diagnosis Date  . Asthma   . Bipolar depression   . Schizophrenia   . Suicide attempt   . Homelessness   . H/O suicide attempt     attempted hanging, gun to head, cut self   Past Surgical History  Procedure Laterality Date  . No past surgeries     Family History  Problem Relation Age of Onset  . Drug abuse Mother   . Mental illness Mother    History  Substance Use Topics  . Smoking status: Current Every Day Smoker -- 1.50 packs/day for 13 years    Types: Cigarettes  . Smokeless tobacco: Not on file  . Alcohol Use: No    Review of Systems  Psychiatric/Behavioral: Positive for suicidal ideas and behavioral problems.  All other systems reviewed and are negative.   Allergies  Review of patient's allergies indicates no known allergies.  Home Medications   Prior to Admission medications   Medication Sig Start Date End Date Taking? Authorizing Provider  albuterol (PROVENTIL HFA;VENTOLIN HFA) 108 (90 BASE) MCG/ACT inhaler Inhale 1-2 puffs into the lungs every 4 (four) hours as needed for wheezing or shortness of breath. 11/23/14  Yes Sanjuana KavaAgnes  I Nwoko, NP  DULoxetine 40 MG CPEP Take 40 mg by mouth daily. For depression 11/23/14  Yes Sanjuana KavaAgnes I Nwoko, NP  gabapentin (NEURONTIN) 300 MG capsule Take 1 capsule (300 mg total) by mouth 3 (three) times daily. For agitation 11/23/14  Yes Sanjuana KavaAgnes I Nwoko, NP  hydrOXYzine (ATARAX/VISTARIL) 25 MG tablet Take 1 tablet (25 mg total) by mouth every 4 (four) hours as needed for anxiety (anxiety). 11/23/14  Yes Sanjuana KavaAgnes I Nwoko, NP  QUEtiapine (SEROQUEL) 50 MG tablet Take 1 tablet (50 mg) daily at 10 a.m & 3 tablets (150 mg) at bedtime:  For agitation/ mood control 11/23/14  Yes Sanjuana KavaAgnes I Nwoko, NP   BP 121/57 mmHg  Pulse 58  Temp(Src) 97.6 F (36.4 C) (Oral)  Resp 18  Ht 5\' 7"  (1.702 m)  Wt 170 lb (77.111 kg)  BMI 26.62 kg/m2  SpO2 98%   Physical Exam  Constitutional: He is oriented to person, place, and time. He appears well-developed and well-nourished. No distress.  HENT:  Head: Normocephalic and atraumatic.  Eyes: Conjunctivae and EOM are normal. No scleral icterus.  Neck: Normal range of motion.  Pulmonary/Chest: Effort normal. No respiratory distress.  Musculoskeletal: Normal range of motion.  Neurological: He is alert and oriented to person, place, and time. He exhibits normal muscle tone. Coordination normal.  Skin: Skin is warm and dry. No rash noted. He is not diaphoretic. No erythema. No pallor.  Psychiatric: His speech is normal. He is withdrawn. Cognition and memory are  normal. He exhibits a depressed mood. He expresses suicidal ideation. He expresses no homicidal ideation. He expresses no suicidal plans and no homicidal plans.  Nursing note and vitals reviewed.   ED Course  Procedures (including critical care time) Labs Review Labs Reviewed  ACETAMINOPHEN LEVEL - Abnormal; Notable for the following:    Acetaminophen (Tylenol), Serum <10.0 (*)    All other components within normal limits  CBC - Abnormal; Notable for the following:    Hemoglobin 12.7 (*)    HCT 38.6 (*)    All other  components within normal limits  COMPREHENSIVE METABOLIC PANEL - Abnormal; Notable for the following:    Potassium 3.3 (*)    All other components within normal limits  URINE RAPID DRUG SCREEN (HOSP PERFORMED) - Abnormal; Notable for the following:    Tetrahydrocannabinol POSITIVE (*)    All other components within normal limits  ETHANOL  SALICYLATE LEVEL    Imaging Review No results found.   EKG Interpretation None      MDM   Final diagnoses:  Major depressive disorder, recurrent, severe without psychotic behavior  Suicidal ideations    24 year old male presents to the emergency department for suicidal ideations and worsening depression. Patient has been medically cleared and evaluated by TTS. He is pending inpatient placement. Disposition to be determined by oncoming ED provider.   Filed Vitals:   01/01/15 2319 01/02/15 0114 01/02/15 0606  BP: 133/61 122/58 121/57  Pulse: 67 59 58  Temp: 98.8 F (37.1 C) 97.9 F (36.6 C) 97.6 F (36.4 C)  TempSrc: Oral Oral Oral  Resp: Height:  (1.702 m)    Weight: 170 lb (77.111 kg)    SpO2: 97% 96% 98%     Antony Madura, PA-C 01/02/15 1610  Marisa Severin, MD 01/02/15 [phone]
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
## 2015-01-02 NOTE — [facility] Counselor (Signed)
[name], AC at [facility], confirms adult unit is currently at capacity. Contacted the following facilities for placement:  BED AVAILABLE, FAXED CLINICAL INFORMATION: [facility], per [facility], per Ale[facility], per Linda [facility], per Nek[facility]  AT CAPACITY: [facility], per Renita [facility], per Jennifer (No beds until Monday) [facility], per David [facility], per El[facility], per [facility], per [facility], per Nancy [facility], per Colleen (No beds until Monday) [facility], per [facility], per Barbara [facility], per Melanie (No beds until Monday) [facility], per Malika [name], per [facility], per James [name], per Ron [facility], per Christina [facility], per Barbara [facility], per Rose [facility], per Jonah   Nate Common Ellis Teagon Kron Jr, LPC, [facility] Triage Specialist [phone] 
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
## 2015-01-02 NOTE — ED Notes (Signed)
NO SIGNS OR SYMPTOMS OF SELF-INJURIOUS BEHAVIOR HAS BEEN DISPLAYED BY PT. PT DOES NOT APPEAR TO BE RESPONDING TO INTERNAL STIMULI.
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
## 2015-01-02 NOTE — ED Provider Notes (Signed)
[facility] are attempting to get ACT team for pt - want to d/c after - will need reeval in AM  Eber HongBrian Austyn Perriello, MD 01/02/15 [phone]

## 2015-01-02 NOTE — Progress Notes (Signed)
CSW contacted Marcelino DusterMichelle the [facility] team nurse who confirmed patient is a new client of their team.  Marcelino DusterMichelle states that one of the ACTT team members will come by the ED tomorrow to check on patient.  [facility] Rudene Poulsen Macy MisLCSW,LCAS [place] ED CSW [phone]

## 2015-01-03 DIAGNOSIS — F332 Major depressive disorder, recurrent severe without psychotic features: Secondary | ICD-10-CM | POA: Diagnosis not present

## 2015-01-03 DIAGNOSIS — F322 Major depressive disorder, single episode, severe without psychotic features: Secondary | ICD-10-CM | POA: Diagnosis not present

## 2015-01-03 NOTE — ED Notes (Signed)
Snack and drink to patient 

## 2015-01-03 NOTE — ED Notes (Signed)
SAFETY CONTRACT SIGNED BY PATIENT PRIOR TO DISCHARGE AND PLACED IN MEDICAL RECORDS.

## 2015-01-03 NOTE — Discharge Instructions (Signed)
Take your medicines as prescribed.   Follow up with your psychiatrist.   Return to ER if you have thoughts of harming yourself or others, hallucinations.

## 2015-01-03 NOTE — ED Notes (Signed)
[facility] AND ASKED ABOUT DISPO ON PATIENT. SHE IS GOING TO CHECK WITH PROVIDER

## 2015-01-03 NOTE — [facility] Counselor (Signed)
Clint Bolderori Beck, [facility], confirmed adult unit is currently at capacity. Contacted the following facilities for placement:  AT CAPACITY: [facility], per [facility], per [facility], per [facility], per [facility], per [employer], per [facility], per [facility], per [facility], per [facility], per [facility], per [facility], per [facility], per [facility] [employer]Janie [facility], per [name], per [facility], per [facility] [name], per Ron Alvia GroveBrynn Marr, per [facility]Denise [facility], per [facility], per [facility], per [facility], per [facility], per [employer]istar International CorporationStephanie  Pt has [facility]T team through [facility] and one of their staff will evaluate Pt later today.   Harlin RainFord Ellis Ria CommentWarrick Jr, LPC, [facility] Triage Specialist [phone]

## 2015-01-03 NOTE — ED Notes (Signed)
All belongings returned to patient- patient signature on belongings sheet verifying all belongings were accounted for.

## 2015-01-03 NOTE — ED Provider Notes (Signed)
  Physical Exam  BP 118/36 mmHg  Pulse 62  Temp(Src) 97.6 F (36.4 C) (Oral)  Resp 20  Ht 5\' 7"  (1.702 m)  Wt 170 lb (77.111 kg)  BMI 26.62 kg/m2  SpO2 99%  Physical Exam  ED Course  Procedures  MDM [facility] from [facility]. Recommend d/c.     Richardean Canalavid H Kimoni Pickerill, MD 01/03/15 [phone]

## 2015-01-03 NOTE — ED Notes (Signed)
tts in progress 

## 2015-01-03 NOTE — ED Notes (Signed)
[facility]

## 2015-01-03 NOTE — Progress Notes (Addendum)
Per Alber[facility] SamFran Hobson, Pt to be discharged. She is requesting that Pt ACTT assess him in the ED for medication management assistance prior to discharge. CSW has attempted multiple times to contact ACTT but has not been able to get in touch with staff. If ACTT does not come by 4pm, Pt can be discharged, per Alberteen SamFran Hobson.  Chad CordialLauren Carter, LCSWA 01/03/2015 2:30 PM
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
## 2015-01-04 ENCOUNTER — Encounter (HOSPITAL_COMMUNITY): Payer: Self-pay | Admitting: Adult Health

## 2015-01-04 ENCOUNTER — Emergency Department (EMERGENCY_DEPARTMENT_HOSPITAL)
Admission: EM | Admit: 2015-01-04 | Discharge: 2015-01-05 | Disposition: A | Discharge status: Other Acute Inpatient Hospital | Payer: No Typology Code available for payment source | Source: Home / Self Care | Attending: Emergency Medicine | Admitting: Emergency Medicine

## 2015-01-04 DIAGNOSIS — Z720 Tobacco use: Secondary | ICD-10-CM | POA: Insufficient documentation

## 2015-01-04 DIAGNOSIS — R45851 Suicidal ideations: Secondary | ICD-10-CM | POA: Insufficient documentation

## 2015-01-04 DIAGNOSIS — F332 Major depressive disorder, recurrent severe without psychotic features: Secondary | ICD-10-CM | POA: Diagnosis present

## 2015-01-04 DIAGNOSIS — J45909 Unspecified asthma, uncomplicated: Secondary | ICD-10-CM | POA: Insufficient documentation

## 2015-01-04 DIAGNOSIS — Z590 Homelessness: Secondary | ICD-10-CM

## 2015-01-04 DIAGNOSIS — T71162A Asphyxiation due to hanging, intentional self-harm, initial encounter: Secondary | ICD-10-CM

## 2015-01-04 DIAGNOSIS — F329 Major depressive disorder, single episode, unspecified: Secondary | ICD-10-CM | POA: Diagnosis not present

## 2015-01-04 DIAGNOSIS — Z79899 Other long term (current) drug therapy: Secondary | ICD-10-CM

## 2015-01-04 NOTE — ED Notes (Signed)
Presents with SI and HI thoughts due to "some bad news" endorses wanting to hurt others and plan to hang self. Flat affect.

## 2015-01-05 ENCOUNTER — Encounter (HOSPITAL_COMMUNITY): Payer: Self-pay | Admitting: *Deleted

## 2015-01-05 ENCOUNTER — Inpatient Hospital Stay (HOSPITAL_COMMUNITY)
Admission: AD | Admit: 2015-01-05 | Discharge: 2015-01-13 | DRG: 885 | Disposition: A | Discharge status: Home / Self Care | Payer: No Typology Code available for payment source | Source: Intra-hospital | Attending: Psychiatry | Admitting: Psychiatry

## 2015-01-05 DIAGNOSIS — Z79899 Other long term (current) drug therapy: Secondary | ICD-10-CM

## 2015-01-05 DIAGNOSIS — R45850 Homicidal ideations: Secondary | ICD-10-CM | POA: Diagnosis not present

## 2015-01-05 DIAGNOSIS — F1220 Cannabis dependence, uncomplicated: Secondary | ICD-10-CM | POA: Diagnosis present

## 2015-01-05 DIAGNOSIS — R45851 Suicidal ideations: Secondary | ICD-10-CM | POA: Insufficient documentation

## 2015-01-05 DIAGNOSIS — F329 Major depressive disorder, single episode, unspecified: Secondary | ICD-10-CM | POA: Diagnosis present

## 2015-01-05 DIAGNOSIS — F17210 Nicotine dependence, cigarettes, uncomplicated: Secondary | ICD-10-CM | POA: Diagnosis present

## 2015-01-05 DIAGNOSIS — F332 Major depressive disorder, recurrent severe without psychotic features: Principal | ICD-10-CM | POA: Diagnosis present

## 2015-01-05 LAB — RAPID URINE DRUG SCREEN, HOSP PERFORMED
Amphetamines: NOT DETECTED
BARBITURATES: NOT DETECTED
BENZODIAZEPINES: NOT DETECTED
COCAINE: NOT DETECTED
Opiates: NOT DETECTED
Tetrahydrocannabinol: POSITIVE — AB

## 2015-01-05 LAB — CBC
HCT: 42.1 % (ref 39.0–52.0)
HEMOGLOBIN: 13.8 g/dL (ref 13.0–17.0)
MCH: 28.3 pg (ref 26.0–34.0)
MCHC: 32.8 g/dL (ref 30.0–36.0)
MCV: 86.4 fL (ref 78.0–100.0)
Platelets: 244 10*3/uL (ref 150–400)
RBC: 4.87 MIL/uL (ref 4.22–5.81)
RDW: 12.9 % (ref 11.5–15.5)
WBC: 10.7 10*3/uL — ABNORMAL HIGH (ref 4.0–10.5)

## 2015-01-05 LAB — SALICYLATE LEVEL: Salicylate Lvl: <4 mg/dL (ref 2.8–20.0)

## 2015-01-05 LAB — COMPREHENSIVE METABOLIC PANEL
ALBUMIN: 4.6 g/dL (ref 3.5–5.2)
ALK PHOS: 81 U/L (ref 39–117)
ALT: 22 U/L (ref 0–53)
ANION GAP: 10 (ref 5–15)
AST: 24 U/L (ref 0–37)
BUN: 18 mg/dL (ref 6–23)
CALCIUM: 9.8 mg/dL (ref 8.4–10.5)
CO2: 30 mmol/L (ref 19–32)
CREATININE: 1.01 mg/dL (ref 0.50–1.35)
Chloride: 101 mmol/L (ref 96–112)
GFR calc non Af Amer: >90 mL/min (ref >90)
GLUCOSE: 92 mg/dL (ref 70–99)
Potassium: 5 mmol/L (ref 3.5–5.1)
Sodium: 141 mmol/L (ref 135–145)
TOTAL PROTEIN: 7.3 g/dL (ref 6.0–8.3)
Total Bilirubin: 0.8 mg/dL (ref 0.3–1.2)

## 2015-01-05 LAB — ETHANOL: Alcohol, Ethyl (B): 73 mg/dL — ABNORMAL HIGH (ref 0–9)

## 2015-01-05 LAB — ACETAMINOPHEN LEVEL: Acetaminophen (Tylenol), Serum: <10 ug/mL — ABNORMAL LOW (ref 10–30)

## 2015-01-05 MED ORDER — ALBUTEROL SULFATE HFA 108 (90 BASE) MCG/ACT IN AERS: 1.0000 | INHALATION_SPRAY | RESPIRATORY_TRACT | Status: DC | PRN

## 2015-01-05 MED ORDER — GABAPENTIN 300 MG PO CAPS
300.0000 mg | ORAL_CAPSULE | Freq: Three times a day (TID) | ORAL | Status: DC
  Administered 2015-01-05 (×2): 300 mg via ORAL
  Filled 2015-01-05 (×2): qty 1

## 2015-01-05 MED ORDER — HYDROXYZINE HCL 25 MG PO TABS
25.0000 mg | ORAL_TABLET | Freq: Four times a day (QID) | ORAL | Status: AC | PRN
  Administered 2015-01-05 – 2015-01-08 (×4): 25 mg via ORAL
  Filled 2015-01-05 (×5): qty 1

## 2015-01-05 MED ORDER — DULOXETINE HCL 20 MG PO CPEP
40.0000 mg | ORAL_CAPSULE | Freq: Every day | ORAL | Status: DC
  Administered 2015-01-05: 40 mg via ORAL
  Filled 2015-01-05: qty 2

## 2015-01-05 MED ORDER — LOPERAMIDE HCL 2 MG PO CAPS: 2.0000 mg | ORAL_CAPSULE | ORAL | Status: AC | PRN

## 2015-01-05 MED ORDER — DULOXETINE HCL 20 MG PO CPEP
40.0000 mg | ORAL_CAPSULE | Freq: Every day | ORAL | Status: DC
  Administered 2015-01-06 – 2015-01-07 (×2): 40 mg via ORAL
  Filled 2015-01-05 (×4): qty 2

## 2015-01-05 MED ORDER — ADULT MULTIVITAMIN W/MINERALS CH
1.0000 | ORAL_TABLET | Freq: Every day | ORAL | Status: DC
  Administered 2015-01-06: 1 via ORAL
  Filled 2015-01-05 (×3): qty 1

## 2015-01-05 MED ORDER — VITAMIN B-1 100 MG PO TABS
100.0000 mg | ORAL_TABLET | Freq: Every day | ORAL | Status: DC
  Administered 2015-01-06: 100 mg via ORAL
  Filled 2015-01-05 (×3): qty 1

## 2015-01-05 MED ORDER — QUETIAPINE FUMARATE 50 MG PO TABS
50.0000 mg | ORAL_TABLET | Freq: Every morning | ORAL | Status: DC
  Administered 2015-01-06 – 2015-01-07 (×2): 50 mg via ORAL
  Filled 2015-01-05 (×4): qty 1

## 2015-01-05 MED ORDER — QUETIAPINE FUMARATE 25 MG PO TABS
50.0000 mg | ORAL_TABLET | Freq: Every morning | ORAL | Status: DC
  Administered 2015-01-05: 50 mg via ORAL
  Filled 2015-01-05: qty 2

## 2015-01-05 MED ORDER — HYDROXYZINE HCL 25 MG PO TABS: 25.0000 mg | ORAL_TABLET | ORAL | Status: DC | PRN

## 2015-01-05 MED ORDER — ONDANSETRON 4 MG PO TBDP: 4.0000 mg | ORAL_TABLET | Freq: Four times a day (QID) | ORAL | Status: AC | PRN

## 2015-01-05 MED ORDER — CHLORDIAZEPOXIDE HCL 25 MG PO CAPS
25.0000 mg | ORAL_CAPSULE | Freq: Four times a day (QID) | ORAL | Status: DC | PRN
  Administered 2015-01-05 – 2015-01-06 (×2): 25 mg via ORAL
  Filled 2015-01-05 (×2): qty 1

## 2015-01-05 MED ORDER — THIAMINE HCL 100 MG/ML IJ SOLN: 100.0000 mg | Freq: Once | INTRAMUSCULAR | Status: DC

## 2015-01-05 MED ORDER — GABAPENTIN 300 MG PO CAPS
300.0000 mg | ORAL_CAPSULE | Freq: Three times a day (TID) | ORAL | Status: DC
Start: 2015-01-05 — End: 2015-01-07
  Administered 2015-01-05 – 2015-01-07 (×7): 300 mg via ORAL
  Filled 2015-01-05 (×12): qty 1
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
## 2015-01-05 NOTE — ED Notes (Signed)
[facility] called to transport 
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
## 2015-01-05 NOTE — Progress Notes (Signed)
D:Patient in the bed on approach.  Patient is irritable and angry.  Patient states he is going through alcohol withdrawal.  Patient states he has been drinking and states he drank right before he came into the hospital.    Patient states he is depressed.  Patient states h eis passive SI but verbally contracts for safety.  Patient denies HI and denies AVH.   A: Staff to monitor Q 15 mins for safety.  Encouragement and support offered.  Scheduled medications administered per orders.   R: Patient remains safe on the unit.  Patient did not attend group tonight.  Patient only visible on the unit only for snack and medications.  Patient taking administered medications.

## 2015-01-05 NOTE — Tx Team (Signed)
Initial Interdisciplinary Treatment Plan   PATIENT STRESSORS: Financial difficulties Legal issue Occupational concerns Substance abuse   PATIENT STRENGTHS: Average or above average intelligence Capable of independent living Communication skills Physical Health   PROBLEM LIST: Problem List/Patient Goals Date to be addressed Date deferred Reason deferred Estimated date of resolution  Legal problems 01/05/2015     Alcohol abuse 01/05/2015     Mult psyche admits 01/05/2015     Noncompliance w/meds 01/05/2015                                    DISCHARGE CRITERIA:  Reduction of life-threatening or endangering symptoms to within safe limits Withdrawal symptoms are absent or subacute and managed without 24-hour nursing intervention  PRELIMINARY DISCHARGE PLAN: Outpatient therapy Return to previous living arrangement  PATIENT/FAMIILY INVOLVEMENT: This treatment plan has been presented to and reviewed with the patient, [name].  The patient and family have been given the opportunity to ask questions and make suggestions.  Cranford MonBeaudry, Slayden Mennenga Evans 01/05/2015, 6:00 PM

## 2015-01-05 NOTE — ED Notes (Signed)
TELEPSYCH IN PROGRESS TO REEVALUATE PATIENT

## 2015-01-05 NOTE — ED Notes (Signed)
[facility] here to transport pt to [facility]. All belongings sent with patient
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
## 2015-01-05 NOTE — ED Provider Notes (Signed)
CSN: [id number]     Arrival date & time 01/04/15  2302 History   First MD Initiated Contact with Patient 01/04/15 2331     Chief Complaint  Patient presents with  . Suicidal  . Homicidal     HPI Pt was seen at 2335. Per pt, c/o gradual onset and worsening of persistent "depression" that began this evening PTA. Pt states he "got some really bad news on the telephone" and then "tried to hang myself." Pt states he was found by his girlfriend and she brought him to the ED for further evaluation. Pt was evaluated yesterday in the ED for SI/SA. Pt continues to endorse SI. Denies hallucinations, no hoarse voice, no neck pain, no syncope, no focal motor weakness, no tingling/numbness in extremities.      Past Medical History  Diagnosis Date  . Asthma   . Bipolar depression   . Schizophrenia   . Suicide attempt   . Homelessness   . H/O suicide attempt     attempted hanging, gun to head, cut self   Past Surgical History  Procedure Laterality Date  . No past surgeries     Family History  Problem Relation Age of Onset  . Drug abuse Mother   . Mental illness Mother    History  Substance Use Topics  . Smoking status: Current Every Day Smoker -- 1.50 packs/day for 13 years    Types: Cigarettes  . Smokeless tobacco: Not on file  . Alcohol Use: No    Review of Systems ROS: Statement: All systems negative except as marked or noted in the HPI; Constitutional: Negative for fever and chills. ; ; Eyes: Negative for eye pain, redness and discharge. ; ; ENMT: Negative for ear pain, hoarseness, nasal congestion, sinus pressure and sore throat. ; ; Cardiovascular: Negative for chest pain, palpitations, diaphoresis, dyspnea and peripheral edema. ; ; Respiratory: Negative for cough, wheezing and stridor. ; ; Gastrointestinal: Negative for nausea, vomiting, diarrhea, abdominal pain, blood in stool, hematemesis, jaundice and rectal bleeding. . ; ; Genitourinary: Negative for dysuria, flank pain and  hematuria. ; ; Musculoskeletal: Negative for back pain and neck pain. Negative for swelling and trauma.; ; Skin: Negative for pruritus, rash, abrasions, blisters, bruising and skin lesion.; ; Neuro: Negative for headache, lightheadedness and neck stiffness. Negative for weakness, altered level of consciousness , altered mental status, extremity weakness, paresthesias, involuntary movement, seizure and syncope.; Psych:  +SI, +SA. No HI, no hallucinations.     Allergies  Review of patient's allergies indicates no known allergies.  Home Medications   Prior to Admission medications   Medication Sig Start Date End Date Taking? Authorizing Provider  albuterol (PROVENTIL HFA;VENTOLIN HFA) 108 (90 BASE) MCG/ACT inhaler Inhale 1-2 puffs into the lungs every 4 (four) hours as needed for wheezing or shortness of breath. 11/23/14  Yes Sanjuana KavaAgnes I Nwoko, NP  DULoxetine 40 MG CPEP Take 40 mg by mouth daily. For depression 11/23/14  Yes Sanjuana KavaAgnes I Nwoko, NP  gabapentin (NEURONTIN) 300 MG capsule Take 1 capsule (300 mg total) by mouth 3 (three) times daily. For agitation 11/23/14  Yes Sanjuana KavaAgnes I Nwoko, NP  hydrOXYzine (ATARAX/VISTARIL) 25 MG tablet Take 1 tablet (25 mg total) by mouth every 4 (four) hours as needed for anxiety (anxiety). 11/23/14  Yes Sanjuana KavaAgnes I Nwoko, NP  QUEtiapine (SEROQUEL) 50 MG tablet Take 1 tablet (50 mg) daily at 10 a.m & 3 tablets (150 mg) at bedtime:  For agitation/ mood control 11/23/14  Yes Nelda MarseilleAgnes I  Nwoko, NP   BP 128/67 mmHg  Pulse 93  Temp(Src) 98 F (36.7 C) (Oral)  Resp 16  Ht  (1.702 m)  Wt 170 lb (77.111 kg)  BMI 26.62 kg/m2  SpO2 96% Physical Exam  2340: Physical examination:  Nursing notes reviewed; Vital signs and O2 SAT reviewed;  Constitutional: Well developed, Well nourished, Well hydrated, In no acute distress; Head:  Normocephalic, atraumatic; Eyes: EOMI, PERRL, No scleral icterus; ENMT: Mouth and pharynx normal, Mucous membranes moist. No hoarse voice, no drooling, no stridor.;  Neck: Supple, Full range of motion, No lymphadenopathy. No rash. No soft tissue crepitus.; Cardiovascular: Regular rate and rhythm, No murmur, rub, or gallop; Respiratory: Breath sounds clear & equal bilaterally, No rales, rhonchi, wheezes.  Speaking full sentences with ease, Normal respiratory effort/excursion; Chest: Nontender, Movement normal; Abdomen: Soft, Nontender, Nondistended, Normal bowel sounds; Genitourinary: No CVA tenderness; Extremities: Pulses normal, No tenderness, No edema, No calf edema or asymmetry.; Neuro: AA&Ox3, Major CN grossly intact.  Speech clear. No gross focal motor or sensory deficits in extremities.; Skin: Color normal, Warm, Dry.; Psych:  Affect flat, poor eye contact.    ED Course  Procedures     EKG Interpretation None      MDM  MDM Reviewed: previous chart, nursing note and vitals Reviewed previous: labs Interpretation: labs     Results for orders placed or performed during the hospital encounter of 01/04/15  Acetaminophen level  Result Value Ref Range   Acetaminophen (Tylenol), Serum <10.0 (L) 10 - 30 ug/mL  CBC  Result Value Ref Range   WBC 10.7 (H) 4.0 - 10.5 K/uL   RBC 4.87 4.22 - 5.81 MIL/uL   Hemoglobin 13.8 13.0 - 17.0 g/dL   HCT 16.1 09.6 - 04.5 %   MCV 86.4 78.0 - 100.0 fL   MCH 28.3 26.0 - 34.0 pg   MCHC 32.8 30.0 - 36.0 g/dL   RDW 40.9 81.1 - 91.4 %   Platelets 244 150 - 400 K/uL  Comprehensive metabolic panel  Result Value Ref Range   Sodium 141 135 - 145 mmol/L   Potassium 5.0 3.5 - 5.1 mmol/L   Chloride 101 96 - 112 mmol/L   CO2 30 19 - 32 mmol/L   Glucose, Bld 92 70 - 99 mg/dL   BUN 18 6 - 23 mg/dL   Creatinine, Ser 7.82 0.50 - 1.35 mg/dL   Calcium 9.8 8.4 - 95.6 mg/dL   Total Protein 7.3 6.0 - 8.3 g/dL   Albumin 4.6 3.5 - 5.2 g/dL   AST 24 0 - 37 U/L   ALT 22 0 - 53 U/L   Alkaline Phosphatase 81 39 - 117 U/L   Total Bilirubin 0.8 0.3 - 1.2 mg/dL   GFR calc non Af Amer >90 >90 mL/min   GFR calc Af Amer >90 >90  mL/min   Anion gap 10 5 - 15  Ethanol (ETOH)  Result Value Ref Range   Alcohol, Ethyl (B) 73 (H) 0 - 9 mg/dL  Salicylate level  Result Value Ref Range   Salicylate Lvl <4.0 2.8 - 20.0 mg/dL  Urine Drug Screen  Result Value Ref Range   Opiates NONE DETECTED NONE DETECTED   Cocaine NONE DETECTED NONE DETECTED   Benzodiazepines NONE DETECTED NONE DETECTED   Amphetamines NONE DETECTED NONE DETECTED   Tetrahydrocannabinol POSITIVE (A) NONE DETECTED   Barbiturates NONE DETECTED NONE DETECTED    0050:  TTS evaluation pending. Holding orders written.     Nicholos Johns  Clarene Duke, DO 01/05/15 0101

## 2015-01-05 NOTE — Progress Notes (Signed)
Pt did not attend group this evening.  

## 2015-01-06 DIAGNOSIS — R45851 Suicidal ideations: Secondary | ICD-10-CM

## 2015-01-06 DIAGNOSIS — F332 Major depressive disorder, recurrent severe without psychotic features: Principal | ICD-10-CM

## 2015-01-06 DIAGNOSIS — R45850 Homicidal ideations: Secondary | ICD-10-CM

## 2015-01-06 MED ORDER — LORAZEPAM 1 MG PO TABS
1.0000 mg | ORAL_TABLET | Freq: Four times a day (QID) | ORAL | Status: AC | PRN
  Administered 2015-01-06 – 2015-01-07 (×5): 1 mg via ORAL
  Filled 2015-01-06 (×5): qty 1

## 2015-01-06 MED ORDER — VITAMIN B-1 100 MG PO TABS
100.0000 mg | ORAL_TABLET | Freq: Every day | ORAL | Status: DC
  Administered 2015-01-07 – 2015-01-13 (×7): 100 mg via ORAL
  Filled 2015-01-06 (×9): qty 1

## 2015-01-06 MED ORDER — ADULT MULTIVITAMIN W/MINERALS CH
1.0000 | ORAL_TABLET | Freq: Every day | ORAL | Status: DC
  Administered 2015-01-07 – 2015-01-13 (×7): 1 via ORAL
  Filled 2015-01-06 (×10): qty 1

## 2015-01-06 NOTE — Progress Notes (Signed)
Recreation Therapy Notes  Animal-Assisted Activity/Therapy (AAA/T) Program Checklist/Progress Notes Patient Eligibility Criteria Checklist & Daily Group note for Rec Tx Intervention  Date: 03.17.2016 Time: 2:45pm [facility]: [address]   AAA/T Program Assumption of Risk Form signed by Patient/ or Parent Legal Guardian yes  Patient is free of allergies or sever asthma yes  Patient reports no fear of animals yes  Patient reports no history of cruelty to animals yes  Patient understands his/her participation is voluntary yes  Patient washes hands before animal contact yes  Patient washes hands after animal contact yes  Behavioral Response: Monopolizing, Intrusive   Education: Charity fundraiserHand Washing, Appropriate Animal Interaction   Education Outcome: Acknowledges education.   Clinical Observations/Feedback: Patient attempted to monopolize therapy dog by engaging him in repeated attempts to perform obedience commands, even as he was engaged with other patients. Patient required three separate prompts from LRT to discontinue behavior and was asked by handler to let therapy dog move on to other patients.     Marykay Lexenise L Latanja Lehenbauer, LRT/CTRS  Dylen Mcelhannon L 01/06/2015 5:22 PM

## 2015-01-06 NOTE — Progress Notes (Signed)
D: Patient has blunted, depressed affect and mood; anxious at times as well. He reported on the self inventory sheet that his sleep, appetite and ability to concentrate are all poor and energy level is low. Patient rates depression, feelings of hopelessness and anxiety "10". Writer observed that the patient has been conversing and playing card games with peers in the dayroom. Patient complained of withdrawal symptoms. He complies with the current medication regimen.  A: Support and encouragement provided to patient. Scheduled medications given per MD orders. Maintain Q15 minute checks for safety.  R: Patient receptive. Denies SI/HI. Endorses AVH, verbalizing that he's hearing the voice of his wife and seeing his wife's face; contracts for safety. Patient remains safe on the hall.
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
## 2015-01-06 NOTE — BHH Group Notes (Signed)
BHH LCSW Group Therapy  [facility] 1:15 - 2:30 PM  01/06/2015 3:26 PM  Type of Therapy:  Group Therapy  Participation Level:  Patient did not attend group.    [name], [name] 01/06/2015   3:26 PM
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
## 2015-01-07 MED ORDER — QUETIAPINE FUMARATE 50 MG PO TABS
50.0000 mg | ORAL_TABLET | Freq: Every day | ORAL | Status: DC
  Administered 2015-01-08 – 2015-01-09 (×2): 50 mg via ORAL
  Filled 2015-01-07 (×4): qty 1

## 2015-01-07 MED ORDER — DULOXETINE HCL 60 MG PO CPEP
60.0000 mg | ORAL_CAPSULE | Freq: Every day | ORAL | Status: DC
  Administered 2015-01-08 – 2015-01-13 (×6): 60 mg via ORAL
  Filled 2015-01-07 (×9): qty 1

## 2015-01-07 MED ORDER — GABAPENTIN 300 MG PO CAPS
600.0000 mg | ORAL_CAPSULE | Freq: Two times a day (BID) | ORAL | Status: DC
  Administered 2015-01-08 – 2015-01-13 (×11): 600 mg via ORAL
  Filled 2015-01-07 (×16): qty 2

## 2015-01-07 NOTE — Progress Notes (Signed)
B[facility] Group Notes:  (Nursing/MHT/Case Management/Adjunct)  Date:  01/07/2015  Time:  10:10 PM  Type of Therapy:  Psychoeducational Skills  Participation Level:  Minimal  Participation Quality:  Inattentive  Affect:  Flat  Cognitive:  Lacking  Insight:  Lacking  Engagement in Group:  Resistant  Modes of Intervention:  Education  Summary of Progress/Problems: The patient had little to share in group except to say that he is dealing with the loss of his wife. The patient was unable to state a coping skill as a theme for the day.   Karalee Hauter S 01/07/2015, 10:10 PM

## 2015-01-07 NOTE — [facility] Group Notes (Signed)
[facility]  01/07/2015 3:54 PM  Type of Therapy:  Group Therapy  Participation Level:  Did Not Attend  [name], Louie Meaders Hairston 01/07/2015, 3:54 PM

## 2015-01-07 NOTE — Tx Team (Signed)
Interdisciplinary Treatment Plan Update   Date Reviewed:  01/07/2015  Time Reviewed:  8:28 AM  Progress in Treatment:   Attending groups: Yes, patient is attending groups. Participating in groups: Yes, engages in group discussion. Taking medication as prescribed: Yes  Tolerating medication: Yes Family/Significant other contact made:  Yes, collateral contact with friend. Patient understands diagnosis: Yes, patient understands diagnosis and need for treatment. Discussing patient identified problems/goals with staff: Yes, patient is able to express goals for treatment and discharge. Medical problems stabilized or resolved: Yes Denies suicidal/homicidal ideation: No Patient has not harmed self or others: Yes  For review of initial/current patient goals, please see plan of care.  Estimated Length of Stay:  3-4 days  Reasons for Continued Hospitalization:  Anxiety Depression Medication stabilization Suicidal ideation  New Problems/Goals identified:    Discharge Plan or Barriers:   [facility]  Additional Comments:  Patient and CSW reviewed patient's identified goals and treatment plan.  Patient verbalized understanding and agreed to treatment plan.   Attendees:  Patient:  01/07/2015 8:28 AM   Signature:  Sallyanne HaversF. Cobos, MD 01/07/2015 8:28 AM  Signature:  01/07/2015 8:28 AM  Signature:  Sheryle HailKimberly Okafor, RN 01/07/2015 8:28 AM  Signature:Britney Guthrie, RN 01/07/2015 8:28 AM  Signature:   01/07/2015 8:28 AM  Signature:  Juline PatchQuylle Nabila Albarracin, LCSW 01/07/2015 8:28 AM  Signature:  Trula SladeHeather Smart,  LCSW-A 01/07/2015 8:28 AM  Signature:  Leisa LenzValerie Enoch, Care Coordinator [facility] 01/07/2015 8:28 AM  Signature: 01/07/2015 8:28 AM  Signature:  01/07/2015  8:28 AM  Signature:   Onnie BoerJennifer Clark, RN [facility] 01/07/2015  8:28 AM  Signature:   01/07/2015  8:28 AM    Scribe for Treatment Team:   Juline PatchQuylle Arthur Speagle,  01/07/2015 8:28 AM

## 2015-01-07 NOTE — Progress Notes (Signed)
Recreation Therapy Notes  Date: 03.18.2016 Time: 9:30am Location: 300 [address] Group Room   Group Topic: Stress Management  Goal Area(s) Addresses:  Patient will actively participate in stress management techniques presented during session.   Behavioral Response: Did not attend.    Marykay Lexenise L Katrianna Friesenhahn, LRT/CTRS  Jearl KlinefelterBlanchfield, Demia Viera L 01/07/2015 4:36 PM

## 2015-01-07 NOTE — [facility] Group Notes (Signed)
[facility]   01/07/2015 1:06 PM    Participation Quality:  Appropraite  Mood/Affect:  Appropriate  Depression Rating:  9  Anxiety Rating:  9  Thoughts of Suicide:  No  Will you contract for safety?   NA  Current AVH:  No  Plan for Discharge/Comments:  Patient attended discharge planning group and actively participated in group. Patient [facility].Suicide prevention education reviewed and SPE document provided.   Transportation Means: Patient has transportation.   Supports:  Patient has a support system.   [name], Joesph JulyQuylle Hairston

## 2015-01-07 NOTE — Progress Notes (Signed)
Pt alert and oriented. Pt anxious and reports mild withdrawls this morning. Pt was medicated and was compliant with meds. Pt also received ativan to help with withdrawal symptoms. Pt denied any SI/HI/AH/VH. Pt participated in some group therapy and ate lunch with other patients. Pt remains appropriate for milieu. No current concerns. RN will continue to monitor.
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
## 2015-01-07 NOTE — [facility] Suicide Risk Assessment (Signed)
[facility] INPATIENT:  Family/Significant Other Suicide Prevention Education  Suicide Prevention Education:  Education Completed; [name], Friend, [phone];  has been identified by the patient as the family member/significant other with whom the patient will be residing, and identified as the person(s) who will aid the patient in the event of a mental health crisis (suicidal ideations/suicide attempt).  With written consent from the patient, the family member/significant other has been provided the following suicide prevention education, prior to the and/or following the discharge of the patient.  The suicide prevention education provided includes the following:  Suicide risk factors  Suicide prevention and interventions  National Suicide Hotline telephone number  [facility] assessment telephone number  [facility] Assistance 911  [facility] and/or [facility]idential Mobile Crisis Unit telephone number  Request made of family/significant other to:  Remove weapons (e.g., guns, rifles, knives), all items previously/currently identified as safety concern.  Friend advised patient does not have access to weapons.    Remove drugs/medications (over-the-counter, prescriptions, illicit drugs), all items previously/currently identified as a safety concern.  The family member/significant other verbalizes understanding of the suicide prevention education information provided.  The family member/significant other agrees to remove the items of safety concern listed above.  [name], [name] 01/07/2015, 1:08 PM

## 2015-01-07 NOTE — Progress Notes (Signed)
Patient ID: [name], male   DOB: December 13, 1990, [age] y.o.   MRN: [id number]  D: Patient pleasant and cooperative with staff. Pt participated in the karaoke group time. No inappropriate behaviors noted during shift. A: Q 15 minute safety checks, encourage staff/peer interaction and group participation. Administer medications as ordered by MD. R: Pt denies SI or plans to harm himself; pt anxious at times. No s/s of distress noted.

## 2015-01-07 NOTE — Progress Notes (Signed)
Patient ID: [name], male   DOB: 1991/04/09, 24 y.o.   MRN: [id number]  Pt has a flat affect and anxious behavior. Pt frequently asks for medications. Pt states "I need my Ativan. I'm having all the symptoms of alcohol withdrawal." Pt endorses nausea, diarrhea, chilling and body aches.   Pt given scheduled and prn medications. Pt encouraged to express concerns and ask questions.   Pt remains safe with 15 minute checks. Will continue to monitor.

## 2015-01-08 ENCOUNTER — Encounter (HOSPITAL_COMMUNITY): Payer: Self-pay | Admitting: Registered Nurse

## 2015-01-08 MED ORDER — HYDROXYZINE HCL 25 MG PO TABS
25.0000 mg | ORAL_TABLET | Freq: Four times a day (QID) | ORAL | Status: AC | PRN
  Administered 2015-01-08 – 2015-01-11 (×9): 25 mg via ORAL
  Filled 2015-01-08 (×8): qty 1

## 2015-01-08 NOTE — Progress Notes (Signed)
D:Pt is irritable this morning and when asking pt about a goal for today, he responded "to get on ADHD medication." Pt then went on to say "when the doctors get their head out of their asses" Pt shows no signs of tremors or sweating at this time. Pt reports passive si thoughts.  A:Offered support and redirection. Gave medications as ordered. R:Pt contracts with staff for safety. Safety maintained on the unit.

## 2015-01-08 NOTE — [facility] Group Notes (Signed)
[facility] Group Notes: (Clinical Social Work)   01/08/2015      Type of Therapy:  Group Therapy   Participation Level:  Did Not Attend despite MHT prompting   Yolunda Kloos Grossman-Orr, LCSW 01/08/2015, 3:00 PM     

## 2015-01-08 NOTE — Progress Notes (Signed)
Patient ID: [name], male   DOB: [birth year], 24 y.o.   MRN: [id number] Adult Psychoeducational Group Note  Date:  01/08/2015 Time: 09:30   Group Topic/Focus:  Orientation:   The focus of this group is to educate the patient on the purpose and policies of crisis stabilization and provide a format to answer questions about their admission.  The group details unit policies and expectations of patients while admitted. Wellness Toolbox:   The focus of this group is to discuss various aspects of wellness, balancing those aspects and exploring ways to increase the ability to experience wellness.  Patients will create a wellness toolbox for use upon discharge.  Participation Level:  Did Not Attend  Participation Quality: n/a  Affect: n/a  Cognitive: n/a  Insight: n/a  Engagement in Group: n/a  Modes of Intervention:  Discussion, Education, Orientation and Support  Additional Comments:  Pt did not attend group. Pt in bed asleep.   Aurora Maskwyman, Saia Derossett E 01/08/2015, 12:52 PM

## 2015-01-08 NOTE — Progress Notes (Signed)
Patient ID: [name], male   DOB: 1991-06-21, 24 y.o.   MRN: [id number]  D: Patient anxious at times, loud and attention-seeking at times. Pt laughing in dayroom, yet with sad affect when approaching medication window. Pt states he is sad because his wife died. RN informed pt that he stated earlier in shift that she was alive, pt just shrugged his shoulders and said "Well, you know". Pt compliant with medications and group session. A: Q 15 minute safety checks, encourage staff/peer interaction and group participation. Administer medications as ordered by MD. R: Pt denies SI. No s/s of distress noted.
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
## 2015-01-09 MED ORDER — IBUPROFEN 600 MG PO TABS
600.0000 mg | ORAL_TABLET | Freq: Four times a day (QID) | ORAL | Status: DC | PRN
  Administered 2015-01-09 – 2015-01-12 (×3): 600 mg via ORAL
  Filled 2015-01-09 (×3): qty 1

## 2015-01-09 NOTE — Progress Notes (Signed)
Patient ID: [name], male   DOB: Apr 07, 1991, 24 y.o.   MRN: [id number] States he spoke to another friend today and she was going to try to find out what is going on with his wife. He states she is in her 8340's and has had Leukemia for years and this time not sure she will make it. He says she is currently in the hospital. He has positive interactions with peers, is verbal and pleasant. Positive for pm groups.

## 2015-01-09 NOTE — Progress Notes (Signed)
Patient ID: [name], male   DOB: 1990-11-02, 24 y.o.   MRN: [id number] D-States this am he is anxious and requested a Vistaril this am at am med pass. He states he is anxious because he got information yesterday from a friend that his wife died. He doesn't seem sad, or provide any additional information.Suggested he contact someone else to verify the story since staff had not heard this to be so. He has a flat affect. Is pleasant and verbal. His self inventory indicates he is a 10 on feelings of depression, anxiety and hopelessness.  A-Support offered. Monitored for safety. Medications as ordered.  R-Did not attend group this am. No complaints voiced. Goal for today is to talk to the Child psychotherapistsocial worker.

## 2015-01-09 NOTE — Progress Notes (Signed)
Patient ID: [name], male   DOB: 08-24-1991, 24 y.o.   MRN: [id number] D)  Has been out on the hall this evening, has been irritating peers.  They have reported him grabbing himself many times over the course of the day, females don't want to interact with him.  Male peers have reported him saying various things that they have found irritating and they have stated they may have to take care of it.  Pt reported he had a headache, stated has had all day and was rather loud about saying no one was helping him, although I told him this was the first time he had mentioned it on this shift. A)  Spoke with pt about his inappropriate behavior, he nodded in acknowledgement, agreed to stop, obtained order for motrin, was given ginger ale and encouraged fluids, was encouraged to lower his voice, watch his language.  Will continue to monitor for safety, continue POC R)   Was receptive, safety maintained.

## 2015-01-09 NOTE — [facility] Group Notes (Signed)
[facility] Group Notes:  (Nursing/MHT/Case Management/Adjunct)  Date:  01/09/2015  Time: 0900 am   Type of Therapy:  Psychoeducational Skills  Participation Level:  Did Not Attend   Cranford MonBeaudry, Lizbet Cirrincione Evans 01/09/2015, 2:34 PM

## 2015-01-09 NOTE — Progress Notes (Signed)
[facility] Group Notes:  (Nursing/MHT/Case Management/Adjunct)  Date:  01/09/2015  Time:  12:09 AM  Type of Therapy:  Group Therapy  Participation Level:  Active  Participation Quality:  Appropriate  Affect:  Excited and Labile  Cognitive:  Alert  Insight:  Improving  Engagement in Group:  Developing/Improving  Modes of Intervention:  Socialization and Support  Summary of Progress/Problems: Pt. Stated his energy level was a 4.  Pt. Stated his family was his support system.  Sondra ComeWilson, Roma Bondar J 01/09/2015, 12:09 AM

## 2015-01-09 NOTE — [facility] Group Notes (Signed)
[facility] Group Notes:  (Clinical Social Work)  01/09/2015  1:15-2:45pm  Summary of Progress/Problems:   The main focus of today's process group was to   1)  Discuss the importance of adding supports  2)  Experience various pieces of music and discuss feelings evoked  3)  Identify the patient's current healthy supports and plan what to add.  An emphasis was placed on using counselor, doctor, therapy groups, 12-step groups, and problem-specific support groups to expand supports.  Suggestions were generated by group about non-professional supports that could possibly be added, including animals, volunteer work, music, coloring, hobbies, church, etc.  The patient expressed full comprehension of the concepts presented, and agreed that there is a need to add more supports.  The patient stated that music helps him a great deal, and he was appropriately interactive with others.  He requested several songs that were connected to his wife whom he said he just lost to cancer.  Although CSW expressed reluctance to make him become emotional, he stated that he needed it.  He did not become emotional, but did joke around with other group members.  Type of Therapy:  Processing Group  Participation Level:  Active  Participation Quality:  Attentive and Sharing  Affect:  Blunted  Cognitive:  Oriented  Insight:  Developing/Improving  Engagement in Therapy:  Engaged  Modes of Intervention:    Activity and Processing  Ambrose MantleMareida Grossman-Orr, LCSW 01/09/2015, 3:40pm

## 2015-01-09 NOTE — Progress Notes (Signed)
Patient ID: [name], male   DOB: 09/03/1991, 24 y.o.   MRN: [id number] D)  Was standing at the nursing station at the beginning of the shift asking about ativan.  Said he had severe anxiety because his wife died, but appeared calm, demeanor was as if he was discussing the weather.  Was told his wife wasn't dead, he looked away, went back into the dayroom where he was soon laughing and interacting with peers.  Came back out to say that if he had a seizure he could fall, asked if he had seizures, he said he might.  Attended group, has been compliant with meds. A)  Will continue to monitor for safety, continue POC R)  Safety maintained.
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
## 2015-01-10 MED ORDER — QUETIAPINE FUMARATE 25 MG PO TABS
25.0000 mg | ORAL_TABLET | Freq: Two times a day (BID) | ORAL | Status: DC
  Administered 2015-01-10 – 2015-01-12 (×5): 25 mg via ORAL
  Filled 2015-01-10 (×12): qty 1

## 2015-01-10 MED ORDER — ATOMOXETINE HCL 18 MG PO CAPS
18.0000 mg | ORAL_CAPSULE | Freq: Every day | ORAL | Status: DC
  Administered 2015-01-11 – 2015-01-13 (×3): 18 mg via ORAL
  Filled 2015-01-10 (×6): qty 1

## 2015-01-10 MED ORDER — ACAMPROSATE CALCIUM 333 MG PO TBEC
666.0000 mg | DELAYED_RELEASE_TABLET | Freq: Three times a day (TID) | ORAL | Status: DC
  Administered 2015-01-10 – 2015-01-13 (×8): 666 mg via ORAL
  Filled 2015-01-10 (×15): qty 2

## 2015-01-10 MED ORDER — QUETIAPINE FUMARATE 100 MG PO TABS
100.0000 mg | ORAL_TABLET | Freq: Every day | ORAL | Status: DC
  Administered 2015-01-10: 100 mg via ORAL
  Filled 2015-01-10 (×4): qty 1

## 2015-01-10 NOTE — Progress Notes (Signed)
Patient ID: [name], male   DOB: [birth year], 24 y.o.   MRN: [id number]  DAR: Pt. Denies HI and A/V Hallucinations. Patient reports passive SI but reports he is able to contract for safety. MD and interdisciplinary team notified during treatment meeting. Patient reports sleep was poor, appetite is fair, energy level is low, and concentration level is poor. Patient rates depression, anxiety, and hopelessness at 10/10. Patient received PRN Vistaril and on reassessment relief was noted. Patient reported pain at 10/10 in back and received PRN Ibuprofen. Patient reported on reassessment the pain was gone. Patient reports no distress at this time. Support and encouragement provided to the patient. Boundaries set and reinforced with this patient. Scheduled medications administered to patient per physician's orders. Patient continues to act immature in his interactions with staff and peers. Patient is attention seeking as well. Q15 minute checks are maintained for safety.
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
## 2015-01-10 NOTE — Plan of Care (Signed)
Problem: Diagnosis: Increased Risk For Suicide Attempt Goal: STG-Patient Will Report Suicidal Feelings to Staff Outcome: Progressing Patient able to reports passive SI to writer and during Print production plannerassessment contracts for safety.

## 2015-01-10 NOTE — Progress Notes (Signed)
D:Patient in the hallway on approach.  Patient is intrusive and childlike.  Patient Facilities managertapped writer on the shoulder and was asked not to do that.  Patient states his goal for today was to stop thinking about alcohol.  Patient states he learned some coping skills to deal with his alcohol cravings.  Patient states he is passive SI but verbally contracts for safety.  Patient denies HI/AVH.   A: Staff to monitor Q 15 mins for safety.  Encouragement and support offered.  Scheduled medications administered per orders. R: Patient remains safe on the unit.  Patient attended group tonight. Patient visible on the unit and interacting with peers.  Patient taking administered medications.

## 2015-01-10 NOTE — [facility] Group Notes (Signed)
[facility]   01/10/2015 9:47 AM    Participation Quality:  Appropraite  Mood/Affect:  Appropriate  Depression Rating:  10  Anxiety Rating:  10  Thoughts of Suicide:  Yes  Will you contract for safety?  Patient states he will try to contract for safety.  Current AVH:  No  Plan for Discharge/Comments:  Patient attended discharge planning group and actively participated in group. He advised of doing "terrible today."  [facility]  Suiicide prevention education reviewed and SPE document provided.   Transportation Means: Patient uses public ttransportation.   Supports:  Patient has a limited support system.   Margarita Bobrowski, Joesph JulyQuylle Hairston

## 2015-01-10 NOTE — [facility] Group Notes (Signed)
[facility] LCSW Group Therapy          Overcoming Obstacles       1:15 -2:30        01/10/2015   3:27 PM  Type of Therapy:  Group Therapy  Participation Level:  Appropriate  Participation Quality:  Appropriate  Affect:  Appropriate  Cognitive:  Appropriate  Insight: Developing/Improving   Engagement in Therapy: Developing/Imprvoing   Modes of Intervention:  Discussion Exploration  Education Rapport BuildingProblem-Solving Support  Summary of Progress/Problems:  The main focus of today's group was overcoming obstacles. He advised the obstacle he has to overcome is his increased drinking.  Patient stated he has a young daughter and does not want her to know about his mental illness or substance abuse.  Patient encouraged to use his problems as a way to teach his child it is okay to ask for help.     Patient able to identify appropriate coping skills.   [name], [name] 01/10/2015   3:27 PM    '

## 2015-01-10 NOTE — Progress Notes (Signed)
Recreation Therapy Notes  Date: 03.21.2016 Time: 9:30am Location: 300 Hall Group Room   Group Topic: Stress Management  Goal Area(s) Addresses:  Patient will actively participate in stress management techniques presented during session.   Behavioral Response: Did not attend.   Marykay Lexenise L Makiyla Linch, LRT/CTRS  Hetvi Shawhan L 01/10/2015 10:12 AM

## 2015-01-11 MED ORDER — ALUM & MAG HYDROXIDE-SIMETH 200-200-20 MG/5ML PO SUSP
30.0000 mL | Freq: Four times a day (QID) | ORAL | Status: DC | PRN
  Administered 2015-01-11: 30 mL via ORAL
  Filled 2015-01-11: qty 30

## 2015-01-11 MED ORDER — QUETIAPINE FUMARATE 300 MG PO TABS
300.0000 mg | ORAL_TABLET | Freq: Every day | ORAL | Status: DC
  Administered 2015-01-11 – 2015-01-12 (×2): 300 mg via ORAL
  Filled 2015-01-11 (×5): qty 1

## 2015-01-11 MED ORDER — ONDANSETRON HCL 4 MG PO TABS
4.0000 mg | ORAL_TABLET | Freq: Three times a day (TID) | ORAL | Status: DC | PRN
  Administered 2015-01-11: 4 mg via ORAL
  Filled 2015-01-11: qty 1
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
## 2015-01-11 NOTE — Tx Team (Signed)
Interdisciplinary Treatment Plan Update   Date Reviewed:  01/11/2015  Time Reviewed:  8:40 AM  Progress in Treatment:   Attending groups: Yes, patient is attending groups. Participating in groups: Yes, engages in group discussion. Taking medication as prescribed: Yes  Tolerating medication: Yes Family/Significant other contact made:  Yes, collateral contact with friend. Patient understands diagnosis: Yes, patient understands diagnosis and need for treatment. Discussing patient identified problems/goals with staff: Yes, patient is able to express goals for treatment and discharge. Medical problems stabilized or resolved: Yes Denies suicidal/homicidal ideation: No Patient has not harmed self or others: Yes  For review of initial/current patient goals, please see plan of care.  Estimated Length of Stay:  1-3 days  Reasons for Continued Hospitalization:  Anxiety Depression Medication stabilization Suicidal Ideation  New Problems/Goals identified:    Discharge Plan or Barriers:   [facility]  Additional Comments:  Continue medication stabilization  Patient and CSW reviewed patient's identified goals and treatment plan.  Patient verbalized understanding and agreed to treatment plan.   Attendees:  Patient:  01/11/2015 8:40 AM   Signature:  Sallyanne HaversF. Cobos, MD 01/11/2015 8:40 AM  Signature:  01/11/2015 8:40 AM  Signature:  Anne HahnBritney Guthried, RN 01/11/2015 8:40 AM  Signature:Britney Laurine BlazerGuthrie, RN 01/11/2015 8:40 AM  Signature:  Baxter HireKristen Drinkard, RN 01/11/2015 8:40 AM  Signature:  Juline PatchQuylle Adylene Dlugosz, LCSW 01/11/2015 8:40 AM  Signature:  Trula SladeHeather Smart,  LCSW-A 01/11/2015 8:40 AM  Signature:  Leisa LenzValerie Enoch, Care Coordinator [facility] 01/11/2015 8:40 AM  Signature: 01/11/2015 8:40 AM  Signature:  01/11/2015  8:40 AM  Signature:   Onnie BoerJennifer Clark, RN [facility] 01/11/2015  8:40 AM  Signature:   01/11/2015  8:40 AM    Scribe for Treatment Team:   Juline PatchQuylle Genna Casimir,  01/11/2015 8:40 AM

## 2015-01-11 NOTE — BHH Group Notes (Signed)
B[facility] Group Notes:  (Nursing/MHT/Case Management/Adjunct)  Date:  01/11/2015  Time:  0900am  Type of Therapy:  Nurse Education  Participation Level:  Did Not Attend  Participation Quality:  Did not attend  Affect:  Did not attend  Cognitive:  Did not attend  Insight:  None  Engagement in Group:  Did not attend  Modes of Intervention:  Discussion, support, education  Summary of Progress/Problems: Pt was invited to group. Pt did not attend and remained in bed resting.  Lendell CapriceGuthrie, Mical Kicklighter A 01/11/2015, 9:56 AM

## 2015-01-11 NOTE — Progress Notes (Signed)
D:  Patient in the hallway on approach.  Patient states he is irritated with staff because he wanted to shave for 30 minutes.  Writer explained to patient states when he wanted to shave the nurses and staff were in shift report.  Patient was told he could shave after group time tonight.  Patient has been intrusive and hyperactive in the hallway.  Patient has been disruptive in the milieu and has t be redirected by Clinical research associatewriter.  Writer also overheard patient talking to another patient about medications.  Writer overheard patient say, "Oh you got Valium?, that aint nothing but a nerve pill."  Writer explained to patient this type of conversation was inappropriate.  Patient states he is passive SI denies HI denies AVH.  Patient verbally contracts for safety.  Patient  A: Staff to monitor Q 15 mins for safety.  Encouragement and support offered.  Scheduled medications administered per orders.   R: Patient remains safe on the unit.  Patient attended group tonight.  Patient visible on the unit and interacting with peers.  Patient taking administered medications.

## 2015-01-11 NOTE — Progress Notes (Signed)
D: Patient is alert and oriented. Pt's mood and affect is depressed and blunted, pt animated and silly at times. Pt's eye contact is fair. Pt denies HI and AVH. Pt reports "passing" suicidal thoughts. Pt's experiencing hypotension this morning (See docflowsheet-vitals). Pt complains of anxiety this morning. Pt not attending morning groups. Pt complains of withdrawal symptoms including nausea and diarrhea, pt reports he will request PRN medication if he does not feel better after eating lunch. Pt continues to complain of nausea this afternoon with no relief from PRN medication, new orders acknowledged. Pt complains of heartburn this evening with no relief from PRN medication. A: Will reassess BP frequently. Active listening by RN. NP, Nicole KindredAgnes made aware of pt's complaints. Pt encouraged to avoid greasy foods and remain upright after meals for 1 hour to reduce heartburn. PRN medication administered for anxiety, nausea, and heartburn per providers orders (See MAR). Scheduled medications administered per providers orders (See MAR). 15 minute checks continued per protocol for patient safety.  R: Pt verbally contracts not to harm self and to come to staff with increased intensity of suicidal thoughts. Patient cooperative and receptive to nursing interventions. Pt remains safe.

## 2015-01-11 NOTE — Plan of Care (Signed)
Problem: Alteration in mood Goal: LTG-Patient reports reduction in suicidal thoughts (Patient reports reduction in suicidal thoughts and is able to verbalize a safety plan for whenever patient is feeling suicidal)  Outcome: Not Progressing Patient continues to endorse having suicidal thoughts today. Pt is able to verbally contract not to harm self and to come to staff with increased intensity of suicidal thoughts.  Problem: Ineffective individual coping Goal: STG: Patient will remain free from self harm Outcome: Progressing Patient remains free from self harm. 15 minute checks continued per protocol for patient safety.   Problem: Diagnosis: Increased Risk For Suicide Attempt Goal: STG-Patient Will Comply With Medication Regime Outcome: Progressing Patient has adhered to medication regimen today with ease.

## 2015-01-12 MED ORDER — QUETIAPINE FUMARATE 50 MG PO TABS
50.0000 mg | ORAL_TABLET | Freq: Two times a day (BID) | ORAL | Status: DC
  Administered 2015-01-13: 50 mg via ORAL
  Filled 2015-01-12 (×4): qty 1

## 2015-01-12 MED ORDER — NICOTINE POLACRILEX 2 MG MT GUM
2.0000 mg | CHEWING_GUM | OROMUCOSAL | Status: DC | PRN
  Filled 2015-01-12: qty 1

## 2015-01-12 MED ORDER — NICOTINE POLACRILEX 2 MG MT GUM: 2.0000 mg | CHEWING_GUM | OROMUCOSAL | Status: DC | PRN

## 2015-01-12 NOTE — [facility] Group Notes (Signed)
[facility] Note   01/12/2015 10:14 AM  Participation Quality:  Patient asked to come to group but did not attend.  [name], Joesph JulyQuylle Hairston
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
## 2015-01-12 NOTE — Progress Notes (Signed)
Recreation Therapy Notes  Animal-Assisted Activity/Therapy (AAA/T) Program Checklist/Progress Notes Patient Eligibility Criteria Checklist & Daily Group note for Rec Tx Intervention  Date: 03.22.2016 Time: 2:45pm Location: 400 Hall Dayroom    AAA/T Program Assumption of Risk Form signed by Patient/ or Parent Legal Guardian yes  Patient is free of allergies or sever asthma yes  Patient reports no fear of animals yes  Patient reports no history of cruelty to animals yes  Patient understands his/her participation is voluntary yes  Patient washes hands before animal contact yes  Patient washes hands after animal contact yes  Behavioral Response: Appropriate  Education: Hand Washing, Appropriate Animal Interaction   Education Outcome: Acknowledges education.   Clinical Observations/Feedback: Patient interacted appropriately with therapy dog team and peers during session. Patient asked appropriate questions about therapy dog and his training.   Lunabelle Oatley L Yazan Gatling, LRT/CTRS        Zakiya Sporrer L 01/12/2015 8:25 AM 
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
## 2015-01-12 NOTE — Treatment Plan (Signed)
Pt overheard in the courtyard discussing a plan to have peers cheek their medications in order to assist a suicidal peer in committing suicide in the hospital.    Pt has also been sending inappropriate and threaetning notes to male peers describing his desire to be with them "one way or another, if its the last thing I do."  As a result of pt's inappropriate behaviors, Dr. Jama Flavorsobos feels that it is necessary for peer safety to keep the pt behind the locked doors of the 500 hall and to explore discharging the patient tomorrow.

## 2015-01-13 MED ORDER — ATOMOXETINE HCL 18 MG PO CAPS: 18.0000 mg | ORAL_CAPSULE | Freq: Every day | ORAL | Status: DC

## 2015-01-13 MED ORDER — GABAPENTIN 300 MG PO CAPS: 600.0000 mg | ORAL_CAPSULE | Freq: Two times a day (BID) | ORAL | Status: DC

## 2015-01-13 MED ORDER — DULOXETINE HCL 60 MG PO CPEP: 60.0000 mg | ORAL_CAPSULE | Freq: Every day | ORAL | Status: DC

## 2015-01-13 MED ORDER — NICOTINE POLACRILEX 2 MG MT GUM: 2.0000 mg | CHEWING_GUM | OROMUCOSAL | Status: DC | PRN

## 2015-01-13 MED ORDER — ALBUTEROL SULFATE HFA 108 (90 BASE) MCG/ACT IN AERS: 1.0000 | INHALATION_SPRAY | RESPIRATORY_TRACT | Status: DC | PRN

## 2015-01-13 MED ORDER — QUETIAPINE FUMARATE 300 MG PO TABS
300.0000 mg | ORAL_TABLET | Freq: Every day | ORAL | Status: DC
Start: 2015-01-13 — End: 2015-09-27

## 2015-01-13 MED ORDER — QUETIAPINE FUMARATE 50 MG PO TABS
50.0000 mg | ORAL_TABLET | Freq: Two times a day (BID) | ORAL | Status: DC
Start: 2015-01-13 — End: 2015-09-27

## 2015-01-13 NOTE — Progress Notes (Addendum)
Pt resting in bed with eyes closed.  Respirations even and unlabored.  No distress noted.  Will continue to monitor and assess for safety.

## 2015-01-13 NOTE — Tx Team (Signed)
  Interdisciplinary Treatment Plan Update   Date Reviewed:  01/13/2015  Time Reviewed:  10:22 AM  Progress in Treatment:   Attending groups: Yes Participating in groups: Yes Taking medication as prescribed: Yes  Tolerating medication: Yes Family/Significant other contact made: Yes  Patient understands diagnosis: Yes  Discussing patient identified problems/goals with staff: Yes  See initial care plan Medical problems stabilized or resolved: Yes Denies suicidal/homicidal ideation: Yes  In tx team Patient has not harmed self or others: Yes  For review of initial/current patient goals, please see plan of care.  Estimated Length of Stay:  D/C today  Reason for Continuation of Hospitalization:   New Problems/Goals identified:  N/A  Discharge Plan or Barriers:   [facility]  Additional Comments:  Attendees:  Signature: Ivin BootySarama Eappen, MD 01/13/2015 10:22 AM   Signature: Richelle Itood Zeek Rostron, LCSW 01/13/2015 10:22 AM  Signature:  01/13/2015 10:22 AM  Signature: Kathi SimpersSarah Twyman, RN 01/13/2015 10:22 AM  Signature:  01/13/2015 10:22 AM  Signature:  01/13/2015 10:22 AM  Signature:   01/13/2015 10:22 AM  Signature:    Signature:    Signature:    Signature:    Signature:    Signature:      Scribe for Treatment Team:   Nucor Corporationod Jansen Goodpasture, LCSW  01/13/2015 10:22 AM
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
## 2015-01-13 NOTE — Progress Notes (Signed)
D: Pt has anxious affect and depressed mood.  Pt reports "I'm still depressed."  He reports his day "started out rough but it's a little better."  Pt reports SI "off and on."  Pt denies plan at this time and he verbally contracts for safety.  Pt reports "I think by the end of the weekend I'll be completely better."  Pt reports he plans to "follow up with care plans.  Seek substance and alcohol treatment.  Get away from people here and start over."  Pt denies HI, denies hallucinations.  He attended evening group.  He has been visible in the milieu, interacting with staff and peers appropriately.   A: Introduced self to pt.  Medications administered per order.  PRN medication administered for pain, see flow sheet.  Encouraged and supported pt.  Actively listened to pt.   R: Pt is compliant with medications.  He verbally contracts for safety.  Will continue to monitor and assess.

## 2015-01-13 NOTE — Progress Notes (Signed)
Patient ID: [name], male   DOB: 06-15-1991, 24 y.o.   MRN: [id number]   Pt currently presents with a flat affect and anxious behavior. Per self inventory, pt rates depression, hopelessness and anxiety at a 0. Pt reports not having a daily goal. Pt states "I hope I'm not making the right [facility]. The doc says it is structured there and that's what I need." Pt reports good sleep, concentration and appetite.  Pt provided with medications per providers orders. Pt's labs and vitals were monitored throughout the day. Pt supported emotionally and encouraged to express concerns and questions. Pt educated on medications and drug abuse. Pt's safety ensured with 15 minute and environmental checks. Pt currently denies SI/HI and A/V hallucinations. Pt verbally agrees to seek staff if SI/HI or A/VH occurs and to consult with staff before acting on these thoughts. Pt to be discharged today.

## 2015-01-13 NOTE — Progress Notes (Signed)
Patient ID: [name], male   DOB: Jun 30, 1991, 24 y.o.   MRN: [id number]  [facility] with [facility] bus pass and a train ticket. Pt was stable. All papers and prescriptions were given and valuables returned. Verbal understanding expressed. Denies SI/HI and A/VH. Pt given opportunity to express concerns and ask questions.

## 2015-01-13 NOTE — Progress Notes (Signed)
Adult Psychoeducational Group Note  Date:  01/13/2015 Time: 09:15 AM  Group Topic/Focus:  Orientation:   The focus of this group is to educate the patient on the purpose and policies of crisis stabilization and provide a format to answer questions about their admission.  The group details unit policies and expectations of patients while admitted.  Participation Level:  Did Not Attend  Participation Quality:  n/a  Affect: n/a  Cognitive: n/a  Insight: n/a  Engagement in Group: n/a  Modes of Intervention:  Discussion, Education, Orientation and Support  Additional Comments:  Pt did not attend group. Pt in bed asleep.   Aurora Maskwyman, Azael Ragain E 01/13/2015, 11:52 AM
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
## 2015-01-13 NOTE — Progress Notes (Signed)
  [facility] Adult Case Management Discharge Plan :  Will you be returning to the same living situation after discharge: No  At discharge, do you have transportation home?: Yes,  [facility] Do you have the ability to pay for your medications: Yes,  MCD  Release of information consent forms completed and in the chart;  Patient's signature needed at discharge.  Patient to Follow up at: Follow-up Information    Follow up with [facility] On 01/18/2015.   Why:  Tuesday, January 18, 2015 at [facility] for an admission assessment.   Contact information:   [address] [place], [place]   [id number]  [phone]      [facility] with [facility].   Why:  Walk in between 8 and 1 M-F.  they are not open tomorrow 01/14/15, so go next week   Contact information:   [address]  [place]  [919] [phone]      Patient denies SI/HI: Yes,  yes    Safety Planning and Suicide Prevention discussed: Yes,  yes  Have you used any form of tobacco in the last 30 days? (Cigarettes, Smokeless Tobacco, Cigars, and/or Pipes): Yes  Has patient been referred to the Quitline?: Patient refused referral  Ida Rogueorth, Stamatia Masri B 01/13/2015, 10:16 AM

## 2015-01-13 NOTE — Plan of Care (Signed)
Problem: Alteration in mood & ability to function due to Goal: LTG-Pt reports reduction in suicidal thoughts (Patient reports reduction in suicidal thoughts and is able to verbalize a safety plan for whenever patient is feeling suicidal)  Outcome: Not Progressing Pt continues to report SI "off and on."  He denied having a current plan when speaking with Clinical research associatewriter.  He verbally contracted for safety.   Goal: STG-Patient will attend groups Outcome: Progressing Pt attended evening group on 01/12/15.

## 2015-01-17 NOTE — Progress Notes (Signed)
Patient Discharge Instructions:  After Visit Summary (AVS):   Faxed to:  01/17/15 Discharge Summary Note:   Faxed to:  01/17/15 Psychiatric Admission Assessment Note:   Faxed to:  01/17/15 Suicide Risk Assessment - Discharge Assessment:   Faxed to:  01/17/15 Faxed/Sent to the Next Level Care provider:  01/17/15 Faxed to [facility] @ [phone] Faxed to [facility] @ [phone]  Jerelene ReddenSheena E [place], 01/17/2015, 3:25 PM
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
## 2015-09-13 ENCOUNTER — Encounter (HOSPITAL_COMMUNITY): Payer: Self-pay | Admitting: Emergency Medicine

## 2015-09-13 ENCOUNTER — Emergency Department (HOSPITAL_COMMUNITY)
Admission: EM | Admit: 2015-09-13 | Discharge: 2015-09-14 | Disposition: A | Discharge status: Other Acute Inpatient Hospital | Payer: Medicaid Other | Attending: Emergency Medicine | Admitting: Emergency Medicine

## 2015-09-13 DIAGNOSIS — Z915 Personal history of self-harm: Secondary | ICD-10-CM | POA: Insufficient documentation

## 2015-09-13 DIAGNOSIS — F332 Major depressive disorder, recurrent severe without psychotic features: Secondary | ICD-10-CM | POA: Insufficient documentation

## 2015-09-13 DIAGNOSIS — J45909 Unspecified asthma, uncomplicated: Secondary | ICD-10-CM | POA: Insufficient documentation

## 2015-09-13 DIAGNOSIS — F209 Schizophrenia, unspecified: Secondary | ICD-10-CM | POA: Insufficient documentation

## 2015-09-13 DIAGNOSIS — F17210 Nicotine dependence, cigarettes, uncomplicated: Secondary | ICD-10-CM | POA: Insufficient documentation

## 2015-09-13 DIAGNOSIS — Z590 Homelessness: Secondary | ICD-10-CM | POA: Insufficient documentation

## 2015-09-13 DIAGNOSIS — Z79899 Other long term (current) drug therapy: Secondary | ICD-10-CM | POA: Insufficient documentation

## 2015-09-13 LAB — COMPREHENSIVE METABOLIC PANEL
ALK PHOS: 77 U/L (ref 38–126)
ALT: 18 U/L (ref 17–63)
AST: 19 U/L (ref 15–41)
Albumin: 4.3 g/dL (ref 3.5–5.0)
Anion gap: 9 (ref 5–15)
BUN: 9 mg/dL (ref 6–20)
CALCIUM: 9.6 mg/dL (ref 8.9–10.3)
CO2: 30 mmol/L (ref 22–32)
CREATININE: 0.9 mg/dL (ref 0.61–1.24)
Chloride: 101 mmol/L (ref 101–111)
Glucose, Bld: 173 mg/dL — ABNORMAL HIGH (ref 65–99)
Potassium: 4.3 mmol/L (ref 3.5–5.1)
Sodium: 140 mmol/L (ref 135–145)
Total Bilirubin: 0.3 mg/dL (ref 0.3–1.2)
Total Protein: 7.2 g/dL (ref 6.5–8.1)

## 2015-09-13 LAB — RAPID URINE DRUG SCREEN, HOSP PERFORMED
AMPHETAMINES: NOT DETECTED
Barbiturates: NOT DETECTED
Benzodiazepines: NOT DETECTED
Cocaine: NOT DETECTED
OPIATES: NOT DETECTED
TETRAHYDROCANNABINOL: NOT DETECTED

## 2015-09-13 LAB — SALICYLATE LEVEL

## 2015-09-13 LAB — CBC
HCT: 40.9 % (ref 39.0–52.0)
HEMOGLOBIN: 13.6 g/dL (ref 13.0–17.0)
MCH: 30 pg (ref 26.0–34.0)
MCHC: 33.3 g/dL (ref 30.0–36.0)
MCV: 90.3 fL (ref 78.0–100.0)
PLATELETS: 188 10*3/uL (ref 150–400)
RBC: 4.53 MIL/uL (ref 4.22–5.81)
RDW: 12.6 % (ref 11.5–15.5)
WBC: 7.9 10*3/uL (ref 4.0–10.5)

## 2015-09-13 LAB — ACETAMINOPHEN LEVEL: Acetaminophen (Tylenol), Serum: <10 ug/mL — ABNORMAL LOW (ref 10–30)

## 2015-09-13 LAB — ETHANOL

## 2015-09-13 NOTE — ED Notes (Signed)
Security called to wand pt  

## 2015-09-13 NOTE — ED Provider Notes (Signed)
CSN: [id number]     Arrival date & time 09/13/15  1848 History   First MD Initiated Contact with Patient 09/13/15 2012     Chief Complaint  Patient presents with  . Suicidal     (Consider location/radiation/quality/duration/timing/severity/associated sxs/prior Treatment) HPI 24 y.o. male presents with suicidal ideation with plan to cut his throat in context of stressors of recent divorce. No medical complaint with the exception of chronic abdominal pain. No homicidal ideations or hallucinations. Timing of symptoms is constant. Duration chronic, however acutely worsening over the last 2 days. Past Medical History  Diagnosis Date  . Asthma   . Bipolar depression (HCC)   . Schizophrenia (HCC)   . Suicide attempt (HCC)   . Homelessness   . H/O suicide attempt     attempted hanging, gun to head, cut self   Past Surgical History  Procedure Laterality Date  . No past surgeries     Family History  Problem Relation Age of Onset  . Drug abuse Mother   . Mental illness Mother    Social History  Substance Use Topics  . Smoking status: Current Every Day Smoker -- 1.50 packs/day for 13 years    Types: Cigarettes  . Smokeless tobacco: None  . Alcohol Use: Yes     Comment: last use 1 week.     Review of Systems  All other systems reviewed and are negative.     Allergies  Review of patient's allergies indicates no known allergies.  Home Medications   Prior to Admission medications   Medication Sig Start Date End Date Taking? Authorizing Provider  albuterol (PROVENTIL HFA;VENTOLIN HFA) 108 (90 BASE) MCG/ACT inhaler Inhale 1-2 puffs into the lungs every 4 (four) hours as needed for wheezing or shortness of breath. 01/13/15  Yes Adonis BrookSheila Agustin, NP  atomoxetine (STRATTERA) 18 MG capsule Take 1 capsule (18 mg total) by mouth daily. 01/13/15  Yes Adonis BrookSheila Agustin, NP  DULoxetine (CYMBALTA) 60 MG capsule Take 1 capsule (60 mg total) by mouth daily. 01/13/15  Yes Adonis BrookSheila Agustin, NP   gabapentin (NEURONTIN) 300 MG capsule Take 2 capsules (600 mg total) by mouth 2 (two) times daily. 01/13/15  Yes Adonis BrookSheila Agustin, NP  QUEtiapine (SEROQUEL) 300 MG tablet Take 1 tablet (300 mg total) by mouth at bedtime. 01/13/15  Yes Adonis BrookSheila Agustin, NP  QUEtiapine (SEROQUEL) 50 MG tablet Take 1 tablet (50 mg total) by mouth 2 (two) times daily. 01/13/15  Yes Adonis BrookSheila Agustin, NP  nicotine polacrilex (NICORETTE) 2 MG gum Take 1 each (2 mg total) by mouth as needed for smoking cessation. Patient not taking: Reported on 09/13/2015 01/13/15   Adonis BrookSheila Agustin, NP   BP 130/65 mmHg  Pulse 70  Temp(Src) 98 F (36.7 C) (Oral)  Resp 16  Ht 5\' 6"  (1.676 m)  Wt 167 lb (75.751 kg)  BMI 26.97 kg/m2  SpO2 100% Physical Exam  Constitutional: He is oriented to person, place, and time. He appears well-developed and well-nourished.  HENT:  Head: Normocephalic and atraumatic.  Eyes: Conjunctivae and EOM are normal.  Neck: Normal range of motion. Neck supple.  Cardiovascular: Normal rate, regular rhythm and normal heart sounds.   Pulmonary/Chest: Effort normal and breath sounds normal. No respiratory distress.  Abdominal: He exhibits no distension. There is no tenderness. There is no rebound and no guarding.  Musculoskeletal: Normal range of motion.  Neurological: He is alert and oriented to person, place, and time.  Skin: Skin is warm and dry.  Vitals reviewed.  ED Course  Procedures (including critical care time) Labs Review Labs Reviewed  COMPREHENSIVE METABOLIC PANEL - Abnormal; Notable for the following:    Glucose, Bld 173 (*)    All other components within normal limits  ACETAMINOPHEN LEVEL - Abnormal; Notable for the following:    Acetaminophen (Tylenol), Serum <10 (*)    All other components within normal limits  ETHANOL  SALICYLATE LEVEL  CBC  URINE RAPID DRUG SCREEN, HOSP PERFORMED    Imaging Review No results found. I have personally reviewed and evaluated these images and lab  results as part of my medical decision-making.   EKG Interpretation None      MDM   Final diagnoses:  None    24 y.o. male with pertinent PMH of bipolar, asthma, schizophrenia presents with recurrent suicidal ideation with plan. Has attempted suicide in the past. Consulted TTS..    I have reviewed all laboratory and imaging studies if ordered as above  No diagnosis found.      Mirian Mo, MD 09/13/15 [id number]

## 2015-09-13 NOTE — ED Notes (Signed)
Sitter at bedside.

## 2015-09-13 NOTE — ED Notes (Signed)
Pt states he has a history of depression and two days ago lost his house and car when his wife filed for divorce and today started having thoughts of hurting himself. Pt states, " I want to cut my throat".

## 2015-09-13 NOTE — BH Assessment (Signed)
Tele Assessment Note   [name] is a 24 y.o. male who [facility] with SI/SA and Depression.  Pt has a depression and states that in the last week he's stressors:(1) lost his job; (2) lost his home and (3) lost his wife, she is divorcing him.  Pt states he is SI with a plan to cut his throat and says in the last week he has attempted SI at least 3-4x's  And each time someone stopped him.  Pt says he was recently in jail for 15 days for a misdemeanor failure to appear charge and while there tried to hang himself in his cell.  He was placed on SI watch and he's had a total of at least 10 SI attempts.  Pt denies HI/AVH.    Pt told this Clinical research associatewriter that he's been self medicating in the last week to deal with his issues:  He is drinking at least 2-12pk of beer everyday and say his last drink was earlier today.  Pt is smoking 1 ounce of marijuana, daily and his last was 1 week ago.  Pt uses pills and says he takes approx 30 pills, frequently during a 24 hour period.  Pt is using 3-5 grams of heroin everyday and his last use was 1 week ago and his is using both crack/poweder cocaine, staring that he uses 3-4 grams of each product, everyday. His last use was 1 week ago.  Pt states that he has seizure due to his SA use and last had a seizure last week.    Diagnosis: Axis I: 296.33 Major depressive disorder, Recurrent episode, Severe; 304.30 Cannabis use disorder, Severe; 304 Opioid use disorder, Severe; 303. 90 Alcohol use disorder, Severe; 304.20 Cocaine use disorder, Severe  Past Medical History:  Past Medical History  Diagnosis Date  . Asthma   . Bipolar depression (HCC)   . Schizophrenia (HCC)   . Suicide attempt (HCC)   . Homelessness   . H/O suicide attempt     attempted hanging, gun to head, cut self    Past Surgical History  Procedure Laterality Date  . No past surgeries      Family History:  Family History  Problem Relation Age of Onset  . Drug abuse Mother   . Mental  illness Mother     Social History:  reports that he has been smoking Cigarettes.  He has a 19.5 pack-year smoking history. He does not have any smokeless tobacco history on file. He reports that he drinks alcohol. He reports that he uses illicit drugs (Marijuana, Oxycodone, "Crack" cocaine, Heroin, IV, and Cocaine).  Additional Social History:  Alcohol / Drug Use Pain Medications: See MAR  Prescriptions: See MAR  Over the Counter: See MAR  History of alcohol / drug use?: Yes Longest period of sobriety (when/how long): None  Negative Consequences of Use: Work / Programmer, multimediachool, Copywriter, advertisingersonal relationships, Armed forces operational officerLegal, Surveyor, quantityinancial Withdrawal Symptoms: Other (Comment) (No current w/d sxs ) Substance #1 Name of Substance 1: Alcohol  1 - Age of First Use: 15 YOM  1 - Amount (size/oz): 2-12Pk  1 - Frequency: Daily  1 - Duration: On-going  1 - Last Use / Amount: 09/13/15 Substance #2 Name of Substance 2: Heroin  2 - Age of First Use: 15 YOM  2 - Amount (size/oz): 3-5 Grams  2 - Frequency: Daily  2 - Duration: On-going  2 - Last Use / Amount: 1Wk Ago  Substance #3 Name of Substance 3: Marijuana  3 -  Age of First Use: 15 YOM  3 - Amount (size/oz): 1 Ounce  3 - Frequency: Daily  3 - Duration: On-going  3 - Last Use / Amount: 1 Wk Ago  Substance #4 Name of Substance 4: Crack Cocaine  4 - Age of First Use: 15 YOM  4 - Amount (size/oz): 3-4 Grams  4 - Frequency: Daily  4 - Duration: On-going  4 - Last Use / Amount: 1 Wk Ago  Substance #5 Name of Substance 5: Powder Cocaine  5 - Age of First Use: 15 YOM  5 - Amount (size/oz): 3-4 Grams  5 - Frequency: Daily  5 - Duration: On-going  5 - Last Use / Amount: 1 Wk Ago  Substance #6 Name of Substance 6: Pills  6 - Age of First Use: Teens  6 - Amount (size/oz): 30 Pills  6 - Frequency: Daily  6 - Duration: On-going  6 - Last Use / Amount: 1 Wk Ago   CIWA: CIWA-Ar BP: 130/65 mmHg Pulse Rate: 70 Nausea and Vomiting: no nausea and no vomiting Tactile  Disturbances: none Tremor: no tremor Auditory Disturbances: not present Paroxysmal Sweats: no sweat visible Visual Disturbances: not present Anxiety: no anxiety, at ease Headache, Fullness in Head: none present Agitation: normal activity Orientation and Clouding of Sensorium: oriented and can do serial additions CIWA-Ar Total: 0 COWS:    PATIENT STRENGTHS: (choose at least two) Communication skills Motivation for treatment/growth  Allergies: No Known Allergies  Home Medications:  (Not in a hospital admission)  OB/GYN Status:  No LMP for male patient.  General Assessment Data Location of Assessment: [facility] ED TTS Assessment: In system Is this a Tele or Face-to-Face Assessment?: Tele Assessment Is this an Initial Assessment or a Re-assessment for this encounter?: Initial Assessment Marital status: Separated Maiden name: Driggs  Is patient pregnant?: No Pregnancy Status: No Living Arrangements: Alone Can pt return to current living arrangement?: Yes Admission Status: Voluntary Is patient capable of signing voluntary admission?: Yes Referral Source: MD Insurance type: MCD  Medical Screening Exam [facility] Walk-in ONLY) Medical Exam completed: No Reason for MSE not completed: Other:  Crisis Care Plan Living Arrangements: Alone Name of Psychiatrist: None  Name of Therapist: None   Education Status Is patient currently in school?: No Current Grade: None  Highest grade of school patient has completed: None  Name of school: None  Contact person: None   Risk to self with the past 6 months Suicidal Ideation: Yes-Currently Present Has patient been a risk to self within the past 6 months prior to admission? : Yes Suicidal Intent: Yes-Currently Present Has patient had any suicidal intent within the past 6 months prior to admission? : Yes Is patient at risk for suicide?: Yes Suicidal Plan?: Yes-Currently Present Has patient had any suicidal plan within the past 6 months prior  to admission? : Yes Specify Current Suicidal Plan: Cut his throat  Access to Means: Yes Specify Access to Suicidal Means: Sharps, Knives  What has been your use of drugs/alcohol within the last 12 months?: Abusing: alcohol, heroin, pills, cocaine and marijuana  Previous Attempts/Gestures: Yes How many times?: 10 Other Self Harm Risks: None  Triggers for Past Attempts: Family contact, Other personal contacts, Unpredictable Intentional Self Injurious Behavior: None Family Suicide History: No Recent stressful life event(s):  (Pls See EPIC Note ) Persecutory voices/beliefs?: No Depression: Yes Depression Symptoms: Loss of interest in usual pleasures, Feeling worthless/self pity, Fatigue, Insomnia Substance abuse history and/or treatment for substance abuse?: Yes Suicide  prevention information given to non-admitted patients: Not applicable  Risk to Others within the past 6 months Homicidal Ideation: No Does patient have any lifetime risk of violence toward others beyond the six months prior to admission? : No Thoughts of Harm to Others: No Current Homicidal Intent: No Current Homicidal Plan: No Access to Homicidal Means: No Identified Victim: None  History of harm to others?: No Assessment of Violence: None Noted Violent Behavior Description: None  Does patient have access to weapons?: No Criminal Charges Pending?: No Does patient have a court date: No Is patient on probation?: No  Psychosis Hallucinations: None noted Delusions: None noted  Mental Status Report Appearance/Hygiene: Disheveled, In scrubs Eye Contact: Fair Motor Activity: Unremarkable Speech: Logical/coherent, Pressured Level of Consciousness: Alert Mood: Depressed, Helpless Affect: Depressed, Appropriate to circumstance, Flat Anxiety Level: None Thought Processes: Coherent, Relevant Judgement: Impaired Orientation: Person, Place, Time, Situation Obsessive Compulsive Thoughts/Behaviors: None  Cognitive  Functioning Concentration: Normal Memory: Recent Intact, Remote Intact IQ: Average Insight: Poor Impulse Control: Poor Appetite: Fair Weight Loss: 0 Weight Gain: 0 Sleep: Decreased Total Hours of Sleep: 4 Vegetative Symptoms: None  ADLScreening [facility] Assessment Services) Patient's cognitive ability adequate to safely complete daily activities?: Yes Patient able to express need for assistance with ADLs?: Yes Independently performs ADLs?: Yes (appropriate for developmental age)  Prior Inpatient Therapy Prior Inpatient Therapy: Yes Prior Therapy Dates: 2013,2014,2015,2016 Prior Therapy Facilty/Provider(s): [facility], [facility], [facility] Reason for Treatment: SI/Depression/SA  Prior Outpatient Therapy Prior Outpatient Therapy: No Prior Therapy Dates: None  Prior Therapy Facilty/Provider(s): None  Reason for Treatment: None  Does patient have an ACCT team?: No Does patient have Intensive In-House Services?  : No Does patient have [facility] services? : No Does patient have P4CC services?: No  ADL Screening (condition at time of admission) Patient's cognitive ability adequate to safely complete daily activities?: Yes Is the patient deaf or have difficulty hearing?: No Does the patient have difficulty seeing, even when wearing glasses/contacts?: No Does the patient have difficulty concentrating, remembering, or making decisions?: No Patient able to express need for assistance with ADLs?: Yes Does the patient have difficulty dressing or bathing?: No Independently performs ADLs?: Yes (appropriate for developmental age) Does the patient have difficulty walking or climbing stairs?: No Weakness of Legs: None Weakness of Arms/Hands: None  Home Assistive Devices/Equipment Home Assistive Devices/Equipment: None  Therapy Consults (therapy consults require a physician order) PT Evaluation Needed: No OT Evalulation Needed: No SLP Evaluation Needed: No Abuse/Neglect Assessment (Assessment to be  complete while patient is alone) Physical Abuse: Denies Verbal Abuse: Denies Sexual Abuse: Denies Exploitation of patient/patient's resources: Denies Self-Neglect: Denies Values / Beliefs Cultural Requests During Hospitalization: None Spiritual Requests During Hospitalization: None Consults Spiritual Care Consult Needed: No Social Work Consult Needed: No Merchant navy officer (For Healthcare) Does patient have an advance directive?: No Would patient like information on creating an advanced directive?: No - patient declined information    Additional Information 1:1 In Past 12 Months?: No CIRT Risk: No Elopement Risk: No Does patient have medical clearance?: Yes     Disposition:  Disposition Initial Assessment Completed for this Encounter: Yes Disposition of Patient: Inpatient treatment program, Referred to (Per Donell Sievert, PA meets criteria for inpt admission ) Type of inpatient treatment program: Adult Patient referred to: Other (Comment) (Per Donell Sievert, PA meets criteria for inpt admission )  Murrell Redden 09/13/2015 10:56 PM

## 2015-09-14 ENCOUNTER — Encounter (HOSPITAL_COMMUNITY): Payer: Self-pay | Admitting: *Deleted

## 2015-09-14 ENCOUNTER — Inpatient Hospital Stay (HOSPITAL_COMMUNITY)
Admission: AD | Admit: 2015-09-14 | Discharge: 2015-09-27 | DRG: 885 | Disposition: A | Discharge status: Home / Self Care | Payer: Medicaid Other | Source: Intra-hospital | Attending: Psychiatry | Admitting: Psychiatry

## 2015-09-14 DIAGNOSIS — R45851 Suicidal ideations: Secondary | ICD-10-CM | POA: Diagnosis present

## 2015-09-14 DIAGNOSIS — F209 Schizophrenia, unspecified: Secondary | ICD-10-CM | POA: Diagnosis present

## 2015-09-14 DIAGNOSIS — F1110 Opioid abuse, uncomplicated: Secondary | ICD-10-CM | POA: Diagnosis present

## 2015-09-14 DIAGNOSIS — J449 Chronic obstructive pulmonary disease, unspecified: Secondary | ICD-10-CM | POA: Diagnosis present

## 2015-09-14 DIAGNOSIS — F17210 Nicotine dependence, cigarettes, uncomplicated: Secondary | ICD-10-CM | POA: Diagnosis present

## 2015-09-14 DIAGNOSIS — F1410 Cocaine abuse, uncomplicated: Secondary | ICD-10-CM | POA: Diagnosis present

## 2015-09-14 DIAGNOSIS — F4310 Post-traumatic stress disorder, unspecified: Secondary | ICD-10-CM | POA: Diagnosis present

## 2015-09-14 DIAGNOSIS — J45909 Unspecified asthma, uncomplicated: Secondary | ICD-10-CM | POA: Diagnosis present

## 2015-09-14 DIAGNOSIS — F419 Anxiety disorder, unspecified: Secondary | ICD-10-CM | POA: Diagnosis present

## 2015-09-14 DIAGNOSIS — F909 Attention-deficit hyperactivity disorder, unspecified type: Secondary | ICD-10-CM | POA: Diagnosis present

## 2015-09-14 DIAGNOSIS — F1210 Cannabis abuse, uncomplicated: Secondary | ICD-10-CM | POA: Diagnosis present

## 2015-09-14 DIAGNOSIS — F332 Major depressive disorder, recurrent severe without psychotic features: Principal | ICD-10-CM | POA: Diagnosis present

## 2015-09-14 DIAGNOSIS — Z818 Family history of other mental and behavioral disorders: Secondary | ICD-10-CM

## 2015-09-14 DIAGNOSIS — G4700 Insomnia, unspecified: Secondary | ICD-10-CM | POA: Diagnosis present

## 2015-09-14 MED ORDER — DULOXETINE HCL 60 MG PO CPEP
60.0000 mg | ORAL_CAPSULE | Freq: Every day | ORAL | Status: DC
  Administered 2015-09-14 – 2015-09-20 (×7): 60 mg via ORAL
  Filled 2015-09-14 (×9): qty 1

## 2015-09-14 MED ORDER — NICOTINE 21 MG/24HR TD PT24
21.0000 mg | MEDICATED_PATCH | Freq: Every day | TRANSDERMAL | Status: DC
  Administered 2015-09-14 – 2015-09-26 (×14): 21 mg via TRANSDERMAL
  Filled 2015-09-14 (×19): qty 1

## 2015-09-14 MED ORDER — ACETAMINOPHEN 325 MG PO TABS
650.0000 mg | ORAL_TABLET | Freq: Four times a day (QID) | ORAL | Status: DC | PRN
  Administered 2015-09-21: 650 mg via ORAL
  Filled 2015-09-14: qty 2

## 2015-09-14 MED ORDER — GABAPENTIN 300 MG PO CAPS
600.0000 mg | ORAL_CAPSULE | Freq: Two times a day (BID) | ORAL | Status: DC
  Administered 2015-09-14 – 2015-09-18 (×8): 600 mg via ORAL
  Filled 2015-09-14 (×13): qty 2

## 2015-09-14 MED ORDER — ALBUTEROL SULFATE HFA 108 (90 BASE) MCG/ACT IN AERS: 1.0000 | INHALATION_SPRAY | RESPIRATORY_TRACT | Status: DC | PRN

## 2015-09-14 MED ORDER — ATOMOXETINE HCL 18 MG PO CAPS
18.0000 mg | ORAL_CAPSULE | Freq: Every day | ORAL | Status: DC
  Administered 2015-09-15 – 2015-09-20 (×6): 18 mg via ORAL
  Filled 2015-09-14 (×9): qty 1

## 2015-09-14 MED ORDER — QUETIAPINE FUMARATE 300 MG PO TABS
300.0000 mg | ORAL_TABLET | Freq: Every day | ORAL | Status: DC
  Administered 2015-09-14 – 2015-09-16 (×3): 300 mg via ORAL
  Filled 2015-09-14 (×5): qty 1

## 2015-09-14 MED ORDER — MAGNESIUM HYDROXIDE 400 MG/5ML PO SUSP: 30.0000 mL | Freq: Every day | ORAL | Status: DC | PRN

## 2015-09-14 MED ORDER — QUETIAPINE FUMARATE 50 MG PO TABS
50.0000 mg | ORAL_TABLET | Freq: Two times a day (BID) | ORAL | Status: DC
  Administered 2015-09-14 – 2015-09-27 (×26): 50 mg via ORAL
  Filled 2015-09-14 (×28): qty 1

## 2015-09-14 MED ORDER — ALUM & MAG HYDROXIDE-SIMETH 200-200-20 MG/5ML PO SUSP: 30.0000 mL | ORAL | Status: DC | PRN

## 2015-09-14 NOTE — BH Assessment (Signed)
[facility] Assessment Progress Note  Patient was admitted to Observational Unit and will be evaluated in the a.m. to determine disposition.

## 2015-09-14 NOTE — ED Notes (Addendum)
A Coke given to drink.

## 2015-09-14 NOTE — BH Assessment (Signed)
[facility] by Assunta FoundShuvon Rankin, NP. Attending provider MD Lucianne MussKumar. Report should be called to [phone]. Rn made aware.

## 2015-09-14 NOTE — Progress Notes (Signed)
Patient ID: [name], male   DOB: 1991-02-16, 24 y.o.   MRN: [id number]  Admission Note-[facility] after he presented there with suicidal ideation and plan to cut throat. States he is able to contract for safety but continues to be suicidal. He states in the past two weeks his wife and kids left him and he lost his job. He had recently been picked up on an old failure to appear charge from a simple assault charge and was recently in jail for 15 days. When he got out his wife and two young children were gone, and he hasnt heard from them since. Also, he was working at [employer] and after he went to jail they wouldn't take him back. Unemployed, homeless. He states he has had previous suicide attempts but this time just doesn't feel like he has the ability to try again. He has lost everything and cant try again to get it back. Also, states he has been using drugs and alcohol. Last used one week ago when he still had money. He states he is "dope sick" UDS was negative. And no physical evidence of withdrawal. States he used crystal meth, heroin, crack "molly" and prescription drugs. Denies any psychosis or thoughts to hurt others. He [facility] but this is the first admission to OBS. Gave him a lunch tray and oriented to unit. Settled in, no complaints.

## 2015-09-14 NOTE — Progress Notes (Signed)
Patient is resting comfortably at this time. Patient stated that he just took his meds. Patient denies SI, HI, and AVH. Patient is alert and oriented x4. Emotional support given and safety checks maintained through constant observation. Patient is receptive to treatment at this time. Will continue to monitor.

## 2015-09-15 ENCOUNTER — Encounter (HOSPITAL_COMMUNITY): Payer: Self-pay | Admitting: *Deleted

## 2015-09-15 DIAGNOSIS — F209 Schizophrenia, unspecified: Secondary | ICD-10-CM | POA: Diagnosis present

## 2015-09-15 DIAGNOSIS — F4310 Post-traumatic stress disorder, unspecified: Secondary | ICD-10-CM | POA: Diagnosis present

## 2015-09-15 DIAGNOSIS — F909 Attention-deficit hyperactivity disorder, unspecified type: Secondary | ICD-10-CM | POA: Diagnosis present

## 2015-09-15 DIAGNOSIS — R45851 Suicidal ideations: Secondary | ICD-10-CM | POA: Diagnosis present

## 2015-09-15 DIAGNOSIS — G4700 Insomnia, unspecified: Secondary | ICD-10-CM | POA: Diagnosis present

## 2015-09-15 DIAGNOSIS — F332 Major depressive disorder, recurrent severe without psychotic features: Secondary | ICD-10-CM | POA: Diagnosis present

## 2015-09-15 DIAGNOSIS — J45909 Unspecified asthma, uncomplicated: Secondary | ICD-10-CM | POA: Diagnosis present

## 2015-09-15 DIAGNOSIS — F1110 Opioid abuse, uncomplicated: Secondary | ICD-10-CM | POA: Diagnosis present

## 2015-09-15 DIAGNOSIS — Z818 Family history of other mental and behavioral disorders: Secondary | ICD-10-CM | POA: Diagnosis not present

## 2015-09-15 DIAGNOSIS — F1210 Cannabis abuse, uncomplicated: Secondary | ICD-10-CM | POA: Diagnosis present

## 2015-09-15 DIAGNOSIS — F17210 Nicotine dependence, cigarettes, uncomplicated: Secondary | ICD-10-CM | POA: Diagnosis present

## 2015-09-15 DIAGNOSIS — J449 Chronic obstructive pulmonary disease, unspecified: Secondary | ICD-10-CM | POA: Diagnosis present

## 2015-09-15 DIAGNOSIS — F1410 Cocaine abuse, uncomplicated: Secondary | ICD-10-CM | POA: Diagnosis present

## 2015-09-15 DIAGNOSIS — F419 Anxiety disorder, unspecified: Secondary | ICD-10-CM | POA: Diagnosis present

## 2015-09-15 NOTE — Progress Notes (Signed)
Patient ID: [name], male   DOB: 1991/09/05, 24 y.o.   MRN: [id number] Pleasant and talkative, stories vary from original admission story. 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 Referenced having another child with his wife, though he said he hasnt had any contact with her for two weeks and doesn't know where she and his two kids are. He has stories for all the places he has lived and the things he has done and people he knows at the different places he has lived.

## 2015-09-15 NOTE — Progress Notes (Signed)
Patient ID: [name], male   DOB: 09-09-1991, 24 y.o.   MRN: [id number] Admission Note-Transferred off the OBS unit onto the adult per an order from Vernona RiegerLaura NP due to patient continuing to endorse suicidal ideation. He states he is able to contract for safety while on the unit because he has people to talk to and support him. He denies any thoughts to hurt others and is not psychotic. He reports frequent drug and alcohol use. Last use per his report was one week ago. He states he uses everything, heroin, "mollie" crack, methamphetamines. He also states he drinks alcohol daily. His UDS was negative on admission. On admission to OBS yesterday stated he was "dope sick" however, he had no physical evidence of withdrawal. He exhibited a good appetite and at end of shift yesterday told this writer he was feeling better. No complaints of withdrawal today but does have a rapid pulse. He has modified his history while on unit with changing details, question his self reporting and its accuracy. Pleasant and cooperative with admission process. [facility] and settled into the unit and showered once on the unit and changed from scrubs into his own clothes.

## 2015-09-15 NOTE — Progress Notes (Signed)
Patient ID: [name], male   DOB: [birth year], [age] y.o.   MRN: [id number] Discharge Note-Order written and decision made per NP to transfer patient to the adult unit, [address] hall due to his continued thoughts of suicide and plan to smother self with a pillow. He states he is able to contract for safety while on unit because people are around him to talk to and comfort him. Transferred to 401[address]1

## 2015-09-15 NOTE — Progress Notes (Signed)
Patient ID: [name], male   DOB: [birth year], 24 y.o.   MRN: [id number] D-Seen this am by Vernona RiegerLaura NP for assessment and disposition. He continues to state he is depressed and doesn't have energy needed to get going again and get himself another job, and family. Also, states he has a drug problem and wants long term treatment. Plan per NP is to transfer him to the adult unit some time today. A-Support offered.Monitored for safety and medications as ordered. R-Waiting on transfer. Cooperative and pleasant. Slept all night and is sleeping now. States when he was awake he hadnt slept in 4 nights so needing this sleep now. Very good appetite, eating seconds for most meals. Again, said he hasnt not eaten for several days, due to no money related to recent reported drug use.
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
## 2015-09-15 NOTE — Tx Team (Signed)
Initial Interdisciplinary Treatment Plan   PATIENT STRESSORS: Financial difficulties Marital or family conflict Substance abuse   PATIENT STRENGTHS: Ability for insight Active sense of humor Average or above average intelligence Capable of independent living General fund of knowledge Motivation for treatment/growth   PROBLEM LIST: Problem List/Patient Goals Date to be addressed Date deferred Reason deferred Estimated date of resolution  Depression 09/15/15     Suicidal thoughts 09/15/15     "Been trying to kill myself" 09/15/15                                          DISCHARGE CRITERIA:  Ability to meet basic life and health needs Improved stabilization in mood, thinking, and/or behavior Verbal commitment to aftercare and medication compliance  PRELIMINARY DISCHARGE PLAN: Attend aftercare/continuing care group  PATIENT/FAMIILY INVOLVEMENT: This treatment plan has been presented to and reviewed with the patient, [name], and/or family member, .  The patient and family have been given the opportunity to ask questions and make suggestions.  [name], [name] 09/15/2015, 10:18 PM

## 2015-09-16 DIAGNOSIS — F332 Major depressive disorder, recurrent severe without psychotic features: Principal | ICD-10-CM

## 2015-09-16 DIAGNOSIS — R45851 Suicidal ideations: Secondary | ICD-10-CM
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
## 2015-09-16 NOTE — Progress Notes (Signed)
D. Pt had been up and visible in milieu this evening, seen interacting appropriately with peers. Pt spoke about how he has been feeling depressed, suicidal and was abusing drugs before coming in. Pt also spoke briefly about family situation that that exacerbated his depression. Pt received medications without incident and did not verbalize any complaints. A. Support and encouragement provided. R. Safety maintained, will continue to monitor.

## 2015-09-16 NOTE — Tx Team (Signed)
Interdisciplinary Treatment Plan Update (Adult)  Date:  09/16/2015  Time Reviewed:  8:27 AM   Progress in Treatment: Attending groups: No. Participating in groups:  No. Taking medication as prescribed:  Yes. Tolerating medication:  Yes. Family/Significant othe contact made:  SPE required for this pt.  Patient understands diagnosis:  Yes. and As evidenced by:  seeking treatment for Discussing patient identified problems/goals with staff:  Yes. Medical problems stabilized or resolved:  Yes. Denies suicidal/homicidal ideation: Yes. Issues/concerns per patient self-inventory:  Other:  New problem(s) identified:  Pt is not attending group at this time.   Discharge Plan or Barriers: CSW assessing for appropriate referrals.   Reason for Continuation of Hospitalization: Depression Medication stabilization Suicidal ideation  Comments:  [name] is a 24 year old male who presented voluntarily to the [facility] with complaint of increased depressive symptoms and suicidal thoughts. The patient reported multiple stressors that have occurred over the last week to include losing his job after being released from jail and coming home to find that his wife had left with his children. Patient reported he was recently in jail for 15 days for a misdemeanor failure to appear charge and while there tried to hang himself in his cell. He was placed on suicide watch and he's had a total of at least ten suicide attempts.He has denied any symptoms of psychosis or homicidal ideation. Patient was transferred to the [facility] for further management and evaluation. Patient reported using numerous substances to deal with his issues including daily use of beer, 3-5 grams of heroine daily, and cocaine when available. Patient denies having had any of these substances for a week. The patient is cooperative with assessment today but appears withdrawn stating "I tried to cut my throat Tuesday but my friend  stopped me. Also have tried to overdose recently. I feel so depressed. Everything has fallen apart. I have little motivation to go on. I have no friends or close family. I have always been a loner. I thought things were good with my wife. We have a good relationship. I have not been able to talk to her and find out what happened. My only guess is that she was upset I was in jail and not around. I don't feel safe right now. I know in my current state that I would try to go end my life again. My wife is pregnant so my I'm only holding on to the knowledge that I might have another child again soon." [name] reports that he has poor compliance with his psychotropic medications taking them "off and on." He is also requesting help with his substance abuse. Patient reports stressors of having no income and currently being homeless. The only protective factor that he is able to identify is having children as he denies other family support and strong religious beliefs. He appears to be at high risk for self harm at this time.   Estimated length of stay:  3-5 days   New goal(s):   Additional Comments:  Patient and CSW reviewed pt's identified goals and treatment plan. Patient verbalized understanding and agreed to treatment plan. CSW reviewed [facility] "Discharge Process and Patient Involvement" Form. Pt verbalized understanding of information provided and signed form.    Review of initial/current patient goals per problem list:  1. Goal(s): Patient will participate in aftercare plan  Met: No.   Target date: at discharge  As evidenced by: Patient will participate within aftercare plan AEB aftercare provider and housing plan at discharge  being identified.  11/25: CSW assessing for appropriate referrals.   2. Goal (s): Patient will exhibit decreased depressive symptoms and suicidal ideations.  Met: No.    Target date: at discharge  As evidenced by: Patient will utilize self rating of depression at 3 or  below and demonstrate decreased signs of depression or be deemed stable for discharge by MD.  11/25: Pt endorsing passive SI/reports high depression today. Depressed/lethargic.   Attendees: Patient:   09/16/2015 8:27 AM   Family:   09/16/2015 8:27 AM   Physician:  Dr. Carlton Adam, MD 09/16/2015 8:27 AM   Nursing:   Beola Cord RN 09/16/2015 8:27 AM   Clinical Social Worker: Maxie Better, LCSW 09/16/2015 8:27 AM   Clinical Social Worker: Marcina Millard LCSW  09/16/2015 8:27 AM   Other:  Agustina Caroli NP  09/16/2015 8:27 AM   Other:  09/16/2015 8:27 AM   Other:   09/16/2015 8:27 AM   Other:  09/16/2015 8:27 AM   Other:  09/16/2015 8:27 AM   Other:  09/16/2015 8:27 AM    09/16/2015 8:27 AM    09/16/2015 8:27 AM    09/16/2015 8:27 AM    09/16/2015 8:27 AM    Scribe for Treatment Team:   Maxie Better, LCSW 09/16/2015 8:27 AM
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
## 2015-09-16 NOTE — Progress Notes (Signed)
Recreation Therapy Notes  Date: 11.25.2016 Time: 9:30am Location: [address] Group Room   Group Topic: Stress Management  Goal Area(s) Addresses:  Patient will actively participate in stress management techniques presented during session.   Behavioral Response: Did not attend.   Chery Giusto L Izack Hoogland, LRT/CTRS        Eschol Auxier L 09/16/2015 10:21 AM 

## 2015-09-16 NOTE — [facility] Suicide Risk Assessment (Signed)
[facility] INPATIENT:  Family/Significant Other Suicide Prevention Education  Suicide Prevention Education:  Patient Refusal for Family/Significant Other Suicide Prevention Education: The patient [name] has refused to provide written consent for family/significant other to be provided Family/Significant Other Suicide Prevention Education during admission and/or prior to discharge.  Physician notified.  SPE completed with pt, as pt refused to consent to family contact. SPI pamphlet provided to pt and pt was encouraged to share information with support network, ask questions, and talk about any concerns relating to SPE. Pt denies access to guns/firearms and verbalized understanding of information provided. Mobile Crisis information also provided to pt.   Smart, Gustave Lindeman LCSW 09/16/2015, 12:49 PM
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
## 2015-09-16 NOTE — Progress Notes (Signed)
D: Pt presents with flat affect and anxious mood. Pt endorses suicidal thoughts with a plan to smother himself with a pillow. Pt verbally contracts [facility]. Pt rates depression 10/10. Anxiety 10/10. Hopeless 10/10. Pt engages on the unit with other pts, talking and laughing throughout the day. Pt compliant with taking meds. No adverse reaction to meds verbalized by pt. Pt stated goal is to find long-term tx program.  A: Medications administered as ordered per MD. Verbal support given. Pt encouraged to attend groups. 15 minute checks performed for safety. R: Pt receptive to tx.

## 2015-09-16 NOTE — [facility] Group Notes (Signed)
[facility]   09/16/2015 9:27 AM  Participation Quality:  Active  Mood/Affect:  Depressed  Depression Rating:  10  Anxiety Rating:  10  Thoughts of Suicide:  Yes Will you contract for safety?   Yes  Current AVH:  No  Plan for Discharge/Comments:  Patient reports that he desires a residential treatment facility for substance abuse but was unclear in regard to why he desires this plan. Patient reports he has no outpatient providers at this time.   Transportation Means: Will need assistance with transportation at discharge.   Supports: None per patient   Paulino DoorPICKETT JR, Taegan Haider C

## 2015-09-17 MED ORDER — BUSPIRONE HCL 5 MG PO TABS
5.0000 mg | ORAL_TABLET | Freq: Three times a day (TID) | ORAL | Status: DC
  Administered 2015-09-17 – 2015-09-19 (×5): 5 mg via ORAL
  Filled 2015-09-17 (×10): qty 1

## 2015-09-17 MED ORDER — QUETIAPINE FUMARATE 400 MG PO TABS
400.0000 mg | ORAL_TABLET | Freq: Every day | ORAL | Status: DC
  Administered 2015-09-17 – 2015-09-26 (×10): 400 mg via ORAL
  Filled 2015-09-17 (×11): qty 1
  Filled 2015-09-17: qty 2
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
## 2015-09-17 NOTE — [facility] Group Notes (Addendum)
  [facility] Group Notes:  (Clinical Social Work)   08/20/2015     1:15-2:15PM  Summary of Progress/Problems:   In today's process group a decisional balance exercise was used to explore in depth the perceived benefits and costs of using the unhealthy coping techniques identified by each patient as one they often use, as well as the  benefits and costs of replacing these with healthy coping skills.  Four patients were very disruptive throughout group and very distracting both to the group process and to staying on task to talk about any meaningful feelings.  CSW addressed this repeatedly and redirected these individuals, but ultimately ended group early with an apology that it was not more productive.  The patient expressed that drugs are his often-used unhealthy coping technique.  He initially participated in some inappropriate banter, but later got serious and tried to mediate between a couple of patients.   Type of Therapy:  Group Therapy - Process   Participation Level:  Active  Participation Quality:   Appropriate  Affect:  Blunted   Cognitive:  Alert  Insight:  Improving  Engagement in Therapy:  Improving  Modes of Intervention:  Education, Motivational Interviewing  Ambrose MantleMareida Grossman-Orr, LCSW 09/17/2015, 2:40 PM

## 2015-09-17 NOTE — [facility] Group Notes (Signed)
[facility] Group Notes:  (Nursing/MHT/Case Management/Adjunct)  Date:  09/17/2015  Time:  11:01 AM  Type of Therapy:  Psychoeducational Skills  Participation Level:  Did Not Attend  Participation Quality:  Did Not Attend  Affect:  Did Not Attend  Cognitive:  Did Not Attend  Insight:  None  Engagement in Group:  Did Not Attend  Modes of Intervention:  Did Not Attend  Summary of Progress/Problems: Pt did not attend patient self inventory group.   Jacquelyne BalintForrest, Oreste Majeed Shanta 09/17/2015, 11:01 AM

## 2015-09-17 NOTE — Progress Notes (Signed)
D: [name] is very anxious throughout the shift. He curses and gets very annoyed at one point when I am having a discussion with another patient about who can receive their medications first. Rates anxiety as "just high". Denies SI/HI/AVH. Contracts for safety. A: Encouragement and support provided.  R: Continue to monitor patient for safety and medication effectiveness.

## 2015-09-17 NOTE — Progress Notes (Signed)
Patient ID: [name], male   DOB: 08-24-1991, 24 y.o.   MRN: [id number]   D: Pt has been very labile on the unit today, at times he is happy playing with his peers, other times he is upset that he can't have his girlfriend back. Pt spent most of the day on the phone trying to talk to his girlfriend and his children. Pt reported that his depression was a 10, his hopelessness was a 10, and his anxiety was 10. Pt reported that he was positive SI, but contracts for safety. Pt reported being negative HI, no AH/VH noted. Pt reported that his goal for today was to talk about discharge plans. A: 15 min checks continued for patient safety. R: Pt safety maintained.

## 2015-09-18 MED ORDER — GABAPENTIN 300 MG PO CAPS
600.0000 mg | ORAL_CAPSULE | Freq: Three times a day (TID) | ORAL | Status: DC
  Administered 2015-09-18 – 2015-09-27 (×25): 600 mg via ORAL
  Filled 2015-09-18 (×32): qty 2

## 2015-09-18 NOTE — [facility] Group Notes (Signed)
[facility] Group Notes:  (Clinical Social Work)   09/18/2015   1:15-2:15PM  Summary of Progress/Problems:   The main focus of today's process group was to   1)  discuss the importance of adding supports  2)  define health supports versus unhealthy supports  3)  identify the patient's current unhealthy supports and plan how to handle them  4)  Identify the patient's current healthy supports and plan what to add.  An emphasis was placed on using counselor, doctor, therapy groups, 12-step groups, and problem-specific support groups to expand supports.    The patient expressed full comprehension of the concepts presented, and agreed that there is a need to add more supports.  The patient stated that he has no supports and no idea how to get any.  He was irritable, and his buddies had walked out of the room after being told that there should be no cursing during group.  He cursed, then apologized.  Type of Therapy:  Process Group with Motivational Interviewing  Participation Level:  Active  Participation Quality:  Attentive and Sharing  Affect:  Depressed, Flat and Irritable  Cognitive:  Appropriate  Insight:  Developing/Improving  Engagement in Therapy:  Engaged  Modes of Intervention:   Education, Support and Processing, Activity  Ambrose MantleMareida Grossman-Orr, LCSW 09/18/2015   5:28 PM

## 2015-09-18 NOTE — [facility] Group Notes (Signed)
[facility] Group Notes:  (Nursing/MHT/Case Management/Adjunct)  Date:  09/18/2015  Time:  11:08 AM  Type of Therapy:  Psychoeducational Skills  Participation Level:  Active  Participation Quality:  Appropriate  Affect:  Appropriate  Cognitive:  Appropriate  Insight:  Appropriate  Engagement in Group:  Engaged  Modes of Intervention:  Discussion  Summary of Progress/Problems: Pt did attend self inventory group.  Jacquelyne BalintForrest, Saryn Cherry Shanta 09/18/2015, 11:08 AM

## 2015-09-18 NOTE — Progress Notes (Signed)
D: [name] is very irritable and anxious this evening. He rates Anxiety 8-9/10 Depression 10/10. Endorses passive SI. Denies HI/AVH. He is not very conversant. States "I just want to take my meds and go to bed".  A: Encouragement and support given.  R: Continue to monitor for patient safety and medication effectiveness.

## 2015-09-18 NOTE — Progress Notes (Signed)
Patient ID: [name], male   DOB: 08-27-1991, 24 y.o.   MRN: [id number]   D: Pt continues to be very labile on the unit, at times is he very silly and inappropriate with his peers other times he is depressed due to the breakup with his girlfriend. Pt was started on Buspar, he reported that he needed it increased because it was not lasting. Aggie NP was made aware of patients concerns, she reported that she was not willing to increase and that she would look at his meds and see if anything else could be increased. Pt reported that his depression was a 10, his hopelessness was a 10, and his anxiety was a 10. Pt reported that he was positive SI, but contracted for safety. Pt reported being negative HI, no AH/VH noted. Pt reported that his goal for today was to find a treatment program, and to talk to the SW. A: 15 min checks continued for patient safety. R: Pt safety maintained.
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
## 2015-09-18 NOTE — Progress Notes (Signed)
Adult Psychoeducational Group Note  Date:  09/18/2015 Time:  9:47 PM  Group Topic/Focus:  Wrap-Up Group:   The focus of this group is to help patients review their daily goal of treatment and discuss progress on daily workbooks.  Participation Level:  Active  Participation Quality:  Appropriate and Attentive  Affect:  Appropriate and Labile  Cognitive:  Alert, Appropriate and Oriented  Insight: Appropriate and Good  Engagement in Group:  Engaged and Limited  Modes of Intervention:  Discussion and Support  Additional Comments:  Pt says that "My day was up and down". Pt mentioned that the Doctor needs to get the meds right. Pt rates his day 2/10. Pt said that he ate dinner and that it was good which was something that has happened today.  Glorious Peachyesha N Larae Caison 09/18/2015, 9:47 PM

## 2015-09-19 ENCOUNTER — Other Ambulatory Visit: Payer: Self-pay

## 2015-09-19 MED ORDER — BUSPIRONE HCL 15 MG PO TABS
7.5000 mg | ORAL_TABLET | Freq: Three times a day (TID) | ORAL | Status: DC
  Administered 2015-09-19 – 2015-09-23 (×11): 7.5 mg via ORAL
  Filled 2015-09-19 (×17): qty 1
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
## 2015-09-19 NOTE — Progress Notes (Signed)
Patient is resting in bed with eyes closed. Respirations are even and non labored. No distress noted. Q 15 minute checks in progress and maintained for safety and monitoring continues.  

## 2015-09-19 NOTE — [facility] Group Notes (Signed)
[facility]   09/19/2015 2:37 PM  Participation Quality:  Active and engaged  Mood/Affect:  Depressed  Depression Rating:  9  Anxiety Rating:  8-9  Thoughts of Suicide:  Yes Will you contract for safety?   Yes  Current AVH:  No  Plan for Discharge/Comments:  Pt is homeless and is interested in long-term residential treatment.  Transportation Means:  Pt reports he has no transportation, CSW will assess for options  Supports:  Pt reports support from family members  Dorothe PeaJonathan F Solimar Maiden, LCSWA

## 2015-09-19 NOTE — Progress Notes (Signed)
D. Pt had been up and visible in milieu this evening, did attend and participate in evening group activity. Pt spoke of his day, spoke about how he is still feeling depressed and reports how his medications are being adjusted to help with his depression and anxiety and did receive bedtime medication without incident. A. Support and encouragement provided. R. Safety maintained,will continue to monitor.
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
## 2015-09-19 NOTE — Progress Notes (Signed)
D: Pt presents anxious on approach and animated. Pt attention seeking, silly, childlike and intrusive. Pt engaging on the unit with other pts, laughing and joking. Pt rates depression 8/10. Anxiety 8/10. Hopeless 8/10. Pt endorses suicidal thoughts with a plan to smother himself with a pillow. Pt verbally contracts not to harm self. Pt stated that he's seeking long-term tx.  Pt reports fair sleep and appetite. Pt tolerating meds well. No adverse reaction to meds verbalized by pt. A: Medications administered as ordered per MD. Verbal support given. Pt encouraged to attend groups. 15 minute checks performed for safety.  R: Pt receptive to tx.

## 2015-09-19 NOTE — [facility] Group Notes (Signed)
[facility] LCSW Group Therapy 09/19/2015  1:15 PM   Type of Therapy: Group Therapy  Participation Level: Did Not Attend. Patient invited to participate but declined.   Georgia Baria, MSW, LCSWA Clinical Social Worker [facility] [phone]   

## 2015-09-19 NOTE — Progress Notes (Signed)
Adult Psychoeducational Group Note  Date:  09/19/2015 Time:  9:24 PM  Group Topic/Focus:  Wrap-Up Group:   The focus of this group is to help patients review their daily goal of treatment and discuss progress on daily workbooks.  Participation Level:  Active  Participation Quality:  Appropriate and Attentive  Affect:  Appropriate  Cognitive:  Appropriate  Insight: Appropriate and Good  Engagement in Group:  Engaged  Modes of Intervention:  Education  Additional Comments:  Pt overall stated he had an ok day. Pt goal for tomorrow is to get the right medication.  Merlinda FrederickKeshia S Samaad Hashem 09/19/2015, 9:24 PM

## 2015-09-20 MED ORDER — FLUOXETINE HCL 20 MG PO CAPS
20.0000 mg | ORAL_CAPSULE | Freq: Every day | ORAL | Status: DC
  Administered 2015-09-21: 20 mg via ORAL
  Filled 2015-09-20 (×4): qty 1

## 2015-09-20 NOTE — [facility] Group Notes (Signed)
[facility] LCSW Group Therapy   09/20/2015 1:15 PM   Type of Therapy: Group Therapy   Participation Level: Did Not Attend. Patient invited to participate but declined.    Dorothe PeaJonathan F. Christine Morton, MSW, LCSWA, LCAS

## 2015-09-20 NOTE — Progress Notes (Signed)
D: Pt presents anxious on approach. Pt continues to be intrusive, childlike, silly and attention seeking.  Pt rates depression 8/10. Anxiety 8/10. Hopeless 8/10. Pt endorses passive suicidal thoughts to smother himself with a pillow. Pt denies SI thoughts at this time and verbally contracts not to harm self. Pt verbalized ongoing issues with his girlfriend. Pt c/o feeling anxious this morning and would like for Buspar to be increased. Writer explained to pt that Buspar was increased yesterday and that it takes time for Buspar to build up in his system to be fully effective. Pt verbalized understanding.  Pt compliant with taking meds and attending groups.  A: Medications administered as ordered per MD. Verbal support given. Pt encouraged to attend groups. 15 minute checks performed for safety. R: Pt stated goal is to find after tx. Pt safety maintained.

## 2015-09-20 NOTE — Progress Notes (Signed)
Recreation Therapy Notes Animal-Assisted Activity (AAA) Program Checklist/Progress Notes Patient Eligibility Criteria Checklist & Daily Group note for Rec Tx Intervention  Date: 11.29.2016 Time: 2:45pm Location: [address] Dayroom    AAA/T Program Assumption of Risk Form signed by Patient/ or Parent Legal Guardian yes  Patient is free of allergies or sever asthma yes  Patient reports no fear of animals yes  Patient reports no history of cruelty to animals yes  Patient understands his/her participation is voluntary yes  Patient washes hands before animal contact yes  Patient washes hands after animal contact yes  Behavioral Response: Appropriate   Education: Hand Washing, Appropriate Animal Interaction   Education Outcome: Acknowledges education.   Clinical Observations/Feedback: Patient engaged appropriately with therapy dog and peers in session.    Taje Littler L Falynn Ailey, LRT/CTRS  Lovetta Condie L 09/20/2015 3:14 PM 

## 2015-09-20 NOTE — [facility] Group Notes (Signed)
Adult Psychoeducational Group Note  Date:  09/20/2015 Time:  8:36 PM  Group Topic/Focus:  Wrap-Up Group:   The focus of this group is to help patients review their daily goal of treatment and discuss progress on daily workbooks.  Participation Level:  Active  Participation Quality:  Appropriate  Affect:  Appropriate  Cognitive:  Appropriate  Insight: Limited  Engagement in Group:  Engaged  Modes of Intervention:  Discussion  Additional Comments:  Patient stated his day was all right.  He got to go to the gym.  He did not have a goal and has no discharge plans as of yet.  Caroll RancherLindsay, Lateef Juncaj A 09/20/2015, 8:36 PM
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
## 2015-09-20 NOTE — Progress Notes (Signed)
Pt has been observed in the milieu, in the dayroom watching TV, and talking on the phone.  He reports he is doing better, and thinks he may be discharged in a couple of days.  He denies SI/HI/AVH.  He makes his needs known to staff.  Support and encouragement offered.  Safety maintained with q15 minute checks.

## 2015-09-20 NOTE — Progress Notes (Signed)
D. Pt had been up and visible in milieu this evening, did attend and participate in evening group activity. Pt seen interacting appropriately with peers and spoke about how he found out this evening that his pregnant wife plans on leaving him and is distraught over this news. Pt received bedtime medication without incident and did not verbalize any complaints of pain. A. Support and encouragement provided. R. Safety maintained, will continue to monitor.

## 2015-09-21 MED ORDER — LORAZEPAM 0.5 MG PO TABS
0.5000 mg | ORAL_TABLET | Freq: Four times a day (QID) | ORAL | Status: DC | PRN
  Administered 2015-09-21 – 2015-09-26 (×5): 0.5 mg via ORAL
  Filled 2015-09-21 (×5): qty 1

## 2015-09-21 NOTE — [facility] Group Notes (Signed)
[facility] LCSW Group Therapy   09/21/2015 1:15 PM   Type of Therapy: Group Therapy   Participation Level: Active   Participation Quality: Attentive and sharing   Affect: Depressed and Flat   Cognitive: Alert and Oriented   Insight: Developing/Improving and Engaged   Engagement in Therapy: Developing/Improving and Engaged   Modes of Intervention: Clarification, Confrontation, Discussion, Education, Exploration, Limit-setting, Orientation, Problem-solving, Rapport Building, Dance movement psychotherapisteality Testing, Socialization and Support   Summary of Progress/Problems: The topic for group today was emotional regulation. This group focused on both positive and negative emotion identification and allowed  group members to process ways to identify feelings, regulate negative emotions, and find healthy ways to manage internal/external emotions. Group members were asked to reflect on a time when their reaction to an emotion led to a negative outcome and explored how alternative responses using emotion regulation would have benefited them. Group members were also asked to discuss a time when emotion regulation was utilized when a negative emotion was experienced. Pt was focused and attentive to the sharing of others, but initially only responded to prompts from the CSW with one or two-word responses. In response to any questions from the CSW, the pt answered with, "I want to kill myself and i just can't do it here'.  Pt shared with the group he is experiencing feelings of agitation due to the fact that [facility] and this makes it difficult for him to kill himself.  As the session progressed the pt did express his frustrations at previous situations where he had felt negative emotions and shared with the group his warning signs for experiencing negative emotions and listed them as withdrawing from others, refusing to talk and isolating.       [name] PeaJonathan F. Aurianna Earlywine, MSW, LCSWA, LCAS

## 2015-09-21 NOTE — [facility] Group Notes (Signed)
[facility] LCSW Aftercare Discharge Planning Group Note   [facility]09/21/2015 8:45 AM  ?  Participation Quality: Alert, Appropriate and Oriented   Mood/Affect: Depressed and Flat   Depression Rating: 9 Anxiety Rating: 10  Thoughts of Suicide: Pt reports yes  Will you contract for safety? Yes   Current AVH: Pt denies   Plan for Discharge/Comments: Pt attended discharge planning group and actively participated in group. CSW provided pt with today's workbook. Pt stated he was not doing well.  Pt was guarded and withdrawn and would not share at length during group.  Pt only answered prompts from the CSW with one word responses.  Pt is homeless and interested in residential treatment at discharge.  [facility]    Transportation Means: Pt reports no access to transportation   Supports: No supports mentioned at this time.      Dorothe PeaJonathan F. Hiilani Jetter, MSW, LCSWA, LCAS
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
## 2015-09-21 NOTE — Progress Notes (Signed)
1830  Patient sitting in dayroom eating dinner with 1:1 present.  Patient stated he continues to be SI to suffocate himself with pillow.  Patient stated he is thinking whether he can contract for safety or not.  Denied HI.  Denied A/V hallucinations.  Denied pain.  Respirations even and unlabored.  No signs/symptoms of pain/distress noted on patient's face/body movements.  1:1 continues for safety per MD order.

## 2015-09-21 NOTE — Plan of Care (Signed)
Problem: Consults Goal: Suicide Risk Patient Education (See Patient Education module for education specifics)  Outcome: Progressing Nurse discussed suicidal thoughts/coping skills with patient.        

## 2015-09-21 NOTE — Progress Notes (Addendum)
1:11 Note:  1500  Per Dr. Jama Flavorsobos, start 1:1 for safety.  Patient SI thoughts, unable to contract.  Patient has been sitting in hallway on [address], shirt pulled over his head.  Dr. Jama Flavorsobos asked patient to come into his office and talk with him but patient refused to move out of his chair in hallway.  Dr. Jama Flavorsobos came out of his office, sat in hallway and talked to patient, then informed nurse to start 1:1 because patient is unable to contract for safety.  Respirations even and unlabored.  No signs/symptoms of pain/distress noted on patient's face/body movements.  1:1 per MD order.  1645  Patient has been sitting in dayroom with 1:1 present for safety.  Denied SI and HI.  Denied A/V hallucinations.  Respirations even and unlabored.  No signs/symptoms of pain/distress noted on patient's face/body movements.  Safety maintained with 1:1 per MD order.

## 2015-09-21 NOTE — Progress Notes (Signed)
Recreation Therapy Notes  Date: 11.30.2016 Time: 9:30am Location: [address] Group Room  Group Topic: Stress Management  Goal Area(s) Addresses:  Patient will actively participate in stress management techniques presented during session.   Behavioral Response: Did not attend.   Milyn Stapleton L Jadian Karman, LRT/CTRS        Kobie Matkins L 09/21/2015 7:26 PM 

## 2015-09-21 NOTE — Progress Notes (Signed)
1 - 1 note: D. Pt up and visible in milieu, attended and participated in evening group activity. Pt seen interacting appropriately with peers. Pt spoke of his day and spoke about how it was terrible. Pt endorsed depression and unable to contract for safety at this time. Pt did receive bedtime medication without incident and did not verbalize any complaints of pain. A. Support and encouragement provided. R. Safety maintained, will continue to monitor.

## 2015-09-21 NOTE — Progress Notes (Signed)
D:  Patient's self inventory sheet, patient sleeps good.  Patient has good appetite, low energy level, good concentration.  Rated depression, hopeless and anxiety 0.  Denied withdrawals. SI, plan to smother with pillow, contracts for safety.  Denied HI.  Denied A/V hallucinations.  Denied pain.  Goal is to work on discharge plans.  Plans to talk to SW.  No discharge plans. A:  Medications administered per MD orders.  Emotional support and encouragement given patient. R:  Denied HI.  Denied A/V hallucinations.  SI to smother with pillow, contracts for safety.  Denied pain.  Safety maintained with 15 minute checks.

## 2015-09-22 MED ORDER — DIVALPROEX SODIUM ER 500 MG PO TB24
500.0000 mg | ORAL_TABLET | Freq: Every day | ORAL | Status: DC
  Administered 2015-09-22: 500 mg via ORAL
  Filled 2015-09-22 (×4): qty 1

## 2015-09-22 NOTE — Progress Notes (Signed)
Observation note: Pt presents anxious and animated. Pt manipulative and attention seeking. Pt reports depression 9/10. Anxiety 9/10. Pt endorses suicidal thoughts to smother him self. Pt unable to contract for safety. Pt do not appear depressed AEB joking and laughing with other pts on the unit. Pt denies any triggers or stressors to his depression and suicidal thoughts. Pt disrupting the milieu by cursing, being intrusive and bad mouthing other MD's on the unit out loud.

## 2015-09-22 NOTE — Progress Notes (Signed)
Patient attend karaoke group tonight.  
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
## 2015-09-22 NOTE — Progress Notes (Addendum)
Pt. Remains on 1:1 observation with Bruce NA.  Pt. Remains sleeping at this time, no acute distress noted.  Respirations are even and unlabored.

## 2015-09-22 NOTE — Progress Notes (Signed)
Pt. observed resting with eyes closed.  Respirations are even and unlabored.  No acute distress is noted.  Bruce, NA remains at the bedside for 1:1 observation.

## 2015-09-22 NOTE — Progress Notes (Signed)
[facility] Post 1:1 Observation Documentation  For the first (8) hours following discontinuation of 1:1 precautions, a progress note entry by nursing staff should be documented at least every 2 hours, reflecting the patient's behavior, condition, mood, and conversation.  Use the progress notes for additional entries.  Time 1:1 discontinued:  1300  Patient's Behavior:  Attention seeking.   Patient's Condition:  Pt safety maintained. Pt verbally contracts not to harm self. Pt observed using telephone.15 minute checks performed for safety.  Patient's Conversation:  Logical/coherent.  Keshonna Valvo L 09/22/2015, 3:41 PM

## 2015-09-22 NOTE — Progress Notes (Signed)
[facility] Post 1:1 Observation Documentation  For the first (8) hours following discontinuation of 1:1 precautions, a progress note entry by nursing staff should be documented at least every 2 hours, reflecting the patient's behavior, condition, mood, and conversation.  Use the progress notes for additional entries.  Time 1:1 discontinued:  1300  Patient's Behavior:  Pt attention seeking, child-like and intrusive.  Patient's Condition:  Pt verbally contracts not to harm self with Tanna SavoyEric, AC., and Cobos, MD. 15 minute checks performed for safety. Pt safety maintained.  Patient's Conversation:  Logical/coherent.  Kerry Odonohue L 09/22/2015, 12:59 PM

## 2015-09-22 NOTE — Progress Notes (Signed)
[facility] Post 1:1 Observation Documentation  For the first (8) hours following discontinuation of 1:1 precautions, a progress note entry by nursing staff should be documented at least every 2 hours, reflecting the patient's behavior, condition, mood, and conversation.  Use the progress notes for additional entries.  Time 1:1 discontinued:  1300  Patient's Behavior:  Loud, disruptive, intrusive and attention seeking.   Patient's Condition:  Pt safety maintained. 15 minute checks performed for safety. Pt verbally contracts for safety.  Patient's Conversation:  Logical/coherent  Ronda Kazmi L 09/22/2015, 6:48 PM

## 2015-09-22 NOTE — Progress Notes (Signed)
[facility] Post 1:1 Observation Documentation  For the first (8) hours following discontinuation of 1:1 precautions, a progress note entry by nursing staff should be documented at least every 2 hours, reflecting the patient's behavior, condition, mood, and conversation.  Use the progress notes for additional entries.  Time 1:1 discontinued:  1300  Patient's Behavior:  Attention seeking, intrusive, silly and child-like  Patient's Condition:  Pt verbally contracts for safety. 15 minute checks performed for safety.  Patient's Conversation:  Logical/coherent   Antonio Creswell L 09/22/2015, 5:31 PM

## 2015-09-22 NOTE — [facility] Group Notes (Signed)
[facility] LCSW Group Therapy 09/22/2015 1:15 PM Type of Therapy: Group Therapy Participation Level: Active  Participation Quality: Attentive, Sharing and Supportive  Affect: Depressed and anxious  Cognitive: Alert and Oriented  Insight: Developing/Improving and Engaged  Engagement in Therapy: Developing/Improving and Engaged  Modes of Intervention: Activity, Clarification, Confrontation, Discussion, Education, Exploration, Limit-setting, Orientation, Problem-solving, Rapport Building, Dance movement psychotherapisteality Testing, Socialization and Support  Summary of Progress/Problems: Patient was attentive and engaged with speaker from [employer]. Patient was attentive to speaker while they shared their story of dealing with mental health and overcoming it. Patient expressed interest in their programs and services and received information on their agency. Patient processed ways they can relate to the speaker.   Samuella BruinKristin Orbin Mayeux, MSW, [employer]CSWA Clinical Social Worker [facility] [phone]

## 2015-09-22 NOTE — Progress Notes (Signed)
[facility] Post 1:1 Observation Documentation  For the first (8) hours following discontinuation of 1:1 precautions, a progress note entry by nursing staff should be documented at least every 2 hours, reflecting the patient's behavior, condition, mood, and conversation.  Use the progress notes for additional entries.  Time 1:1 discontinued:  1300  Patient's Behavior:  Laughing, talking with his peers, attended karaoke  Patient's Condition:  Good  Patient's Conversation:  Denies SI and contracts for safety.  Earlier rated anxiety and depression high and now states" I feel good, I don't want to hurt myself, but that can change".  Cooper RenderSadler, Lewayne Pauley Jean Horne 09/22/2015, 9:35 PM

## 2015-09-22 NOTE — Progress Notes (Signed)
D Pt. Denies SI and HI, no complaints of pain or discomfort noted at present time.   A Writer offered support and encouragement,  Discussed coping skills  With pt   .R Pt. Remains safe on the unit. Pt. Will not acknowledge coping skills, stating nothing helps me, medicine or coping skills.  Rating his anxiety and depression at an 8.  Pt. Attended karaoke and returned to unit laughing loudly reporting he had a good time.  Pt. Denies SI and contracts for safety, then states "But that can change", almost as though he realized he was having too much fun. Pt. Then returned to the dayroom interacting and laughing with his peers.

## 2015-09-23 MED ORDER — DIVALPROEX SODIUM ER 250 MG PO TB24
750.0000 mg | ORAL_TABLET | Freq: Every day | ORAL | Status: DC
  Administered 2015-09-23 – 2015-09-26 (×4): 750 mg via ORAL
  Filled 2015-09-23 (×5): qty 3

## 2015-09-23 NOTE — Progress Notes (Signed)
Patient ID: [name], male   DOB: [birth year], 24 y.o.   MRN: [id number] D: Client visible on the unit, reports anxiety "8" of 10, notes, admission helpful "the groups, coping skills, someone to talk to" "Pressure coming down" "medications getting adjusted" Client visible in dayroom and on unit, interacting with peers and watching TV. A: Writer encouraged group, advised client to notify staff of any concerns. Staff will monitor q10615min for safety. R: client is safe on the unit, attended group.

## 2015-09-23 NOTE — Progress Notes (Addendum)
NS[facility] 7a-7p shift:   D:  Pt. Has been anxious and depressed, stating that he is still hopeful that he and his wife will be able to work on their relationship.  "She's four months pregnant with (fraternal) twins; a boy and a girl.  He stated that they had another child together and he had one with another woman before that.   Pt stated that he had really enjoyed karaoke last night and that it had helped improve his mood.  A: Support, education, and encouragement provided as needed.  Level 3 checks continued for safety.  R: Pt. receptive to intervention/s.  Safety maintained.  Joaquin MusicMary Ailin Rochford, RN
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
## 2015-09-23 NOTE — [facility] Group Notes (Signed)
[facility]  09/23/2015 8:45 AM  Participation Quality: Did Not Attend. Patient invited to participate but declined.   Dorothe PeaJonathan F. Yousaf Sainato, MSW, LCSWA, LCAS

## 2015-09-23 NOTE — Progress Notes (Signed)
Adult Psychoeducational Group Note  Date:  09/23/2015 Time:  9:11 PM  Group Topic/Focus:  Wrap-Up Group:   The focus of this group is to help patients review their daily goal of treatment and discuss progress on daily workbooks.  Participation Level:  Active  Participation Quality:  Appropriate  Affect:  Appropriate  Cognitive:  Alert  Insight: Appropriate  Engagement in Group:  Engaged  Modes of Intervention:  Discussion  Additional Comments:  Patient stated having an okay day. Patient's goal for today was to get depression under control.   Celsa Nordahl L Brandin Stetzer 09/23/2015, 9:11 PM

## 2015-09-23 NOTE — [facility] Group Notes (Signed)
[facility] LCSW Group Therapy 09/23/2015 1:15 PM Type of Therapy: Group Therapy Participation Level: Minimal  Participation Quality: Limited  Affect: Depressed and Flat  Cognitive: Alert and Oriented  Insight: Developing/Improving and Engaged  Engagement in Therapy: Developing/Improving and Engaged  Modes of Intervention: Clarification, Confrontation, Discussion, Education, Exploration, Limit-setting, Orientation, Problem-solving, Rapport Building, Dance movement psychotherapisteality Testing, Socialization and Support  Summary of Progress/Problems: The topic for today was feelings about relapse. Pt discussed what relapse prevention is to them and identified triggers that they are on the path to relapse. Pt processed their feeling towards relapse and was able to relate to peers. Pt discussed coping skills that can be used for relapse prevention. Patient participated minimally in group discussion and left early for unknown reason.    Samuella BruinKristin Kevina Piloto, MSW, [employer]CSWA Clinical Social Worker [facility] [phone]
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
## 2015-09-24 NOTE — Progress Notes (Signed)
Patient ID: [name], male   DOB: [birth year], 24 y.o.   MRN: [id number] D: Client visible on the unit, "alright day" anxiety level "6" of 10. "depression up and down" A: Writer encouraged client to participate in the groups and use coping skills to help with anxiety, medication reviewed and administered as ordered. Staff will monitor q7615min for safety. R: client is safe on the unit, attended group.

## 2015-09-24 NOTE — Plan of Care (Signed)
Problem: Diagnosis: Increased Risk For Suicide Attempt Goal: LTG-Patient Will Report Improved Mood and Deny Suicidal LTG (by discharge) Patient will report improved mood and deny suicidal ideation.  Outcome: Progressing Client reports improved mood AEB "pressure coming down" "I can contract for safety"
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
## 2015-09-24 NOTE — [facility] Group Notes (Signed)
[facility] Group Notes: (Clinical Social Work)   09/24/2015      Type of Therapy:  Group Therapy   Participation Level:  Did Not Attend despite MHT prompting   [name] Heyden Grossman-Orr, LCSW 09/24/2015, 3:58 PM     

## 2015-09-24 NOTE — Progress Notes (Signed)
Patient ID: [name], male   DOB: 1990/12/30, 24 y.o.   MRN: [id number]  Adult Psychoeducational Group Note  Date:  09/24/2015 Time: 09:40am  Group Topic/Focus:  Healthy Communication:   The focus of this group is to discuss communication, barriers to communication, as well as healthy ways to communicate with others.  Participation Level:  None  Participation Quality:  Inattentive and Resistant  Affect:  Flat  Cognitive:  Oriented  Insight: Limited  Engagement in Group:  Distracting and Resistant  Modes of Intervention:  Activity, Discussion, Education, Orientation and Support  Additional Comments:  Pt answered introduction questions when prompted. Pt attended group but choose not to participate. Pt sat in chair with blanket covering his head.   Aurora Maskwyman, Menno Vanbergen E 09/24/2015, 11:44 AM

## 2015-09-25 NOTE — Progress Notes (Signed)
Adult Psychoeducational Group Note  Date:  09/25/2015 Time:  9:08 PM  Group Topic/Focus:  Wrap-Up Group:   The focus of this group is to help patients review their daily goal of treatment and discuss progress on daily workbooks.  Participation Level:  Active  Participation Quality:  Appropriate  Affect:  Appropriate  Cognitive:  Alert  Insight: Appropriate  Engagement in Group:  Engaged  Modes of Intervention:  Discussion  Additional Comments:  Patient stated having a good day today, Patient stated his goal for today was wanting the [employer] to win. Patient met his goal.  Drisana Schweickert L Lesha Jager 09/25/2015, 9:08 PM

## 2015-09-25 NOTE — Progress Notes (Signed)
D.  Pt pleasant on approach, denies complaints at this time.  Requested night time medications as early as possible.  Positive for evening wrap up group, interacting appropriately with peers on the unit.  Denies SI/HI/hallucinatinons at this time.  A.  Support and encouragement offered, night time medications given at 2100 per Pt request.  R.  Pt remains safe on unit, will continue to monitor.
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
## 2015-09-25 NOTE — [facility] Group Notes (Signed)
[facility] Group Notes:  (Nursing/MHT/Case Management/Adjunct)  Date:  09/25/2015  Time:  1:31 PM  Type of Therapy:  Psychoeducational Skills  Participation Level:  Minimal  Participation Quality:  Inattentive  Affect:  Blunted  Cognitive:  Appropriate  Insight:  Limited  Engagement in Group:  Lacking  Modes of Intervention:  Discussion and Education  Summary of Progress/Problems: The purpose of this group is to discuss healthy support systems. Patient's also were able to focus on perspectives and think about their irrational thoughts and asked to name one. Dowell reports, "I'm overly confident."  Patient has eyes closed intermittently in group. A male peer sits facing him most of the time and they have small side conversations. Patient does not appear interested in topic.   [name] E 09/25/2015, 1:31 PM

## 2015-09-25 NOTE — Progress Notes (Signed)
Patient ID: [name], male   DOB: 08/18/1991, 24 y.o.   MRN: [id number]  DAR: Pt. Denies SI/HI and A/V Hallucinations during morning assessment however reports on his daily inventory sheet SI thoughts on and off. Writer inquired and he states he is able to contract for safety. He report sleep last night was good, appetite is good, energy level is low, and concentration is poor. He rates his depression, anxiety, and hopelessness at 6/10. Patient does not report any pain or discomfort at this time. Support and encouragement provided to the patient. He is seen in the milieu interacting with his peers and staff. Patient is receptive and cooperative however remains somewhat attention seeking. Q15 minute checks are maintained for safety.

## 2015-09-25 NOTE — [facility] Group Notes (Signed)
[facility] Group Notes:  (Clinical Social Work)   09/25/2015 1:15-2:15PM  Summary of Progress/Problems:   The main focus of today's process group was to   1)  discuss the importance of adding supports  2)  define health supports versus unhealthy supports  3)  identify the patient's current unhealthy supports and plan how to handle them  4)  Identify the patient's current healthy supports and plan what to add.  An emphasis was placed on using counselor, doctor, therapy groups, 12-step groups, and problem-specific support groups to expand supports.    The patient expressed full comprehension of the concepts presented, and agreed that there is a need to add more supports.  The patient stated that he has had negative experiences with professional supports and that makes him not want to reach out to them.  He was very sleepy due to Seroquel.  Type of Therapy:  Process Group with Motivational Interviewing  Participation Level:  Minimal  Participation Quality:  Drowsy  Affect:  Blunted  Cognitive:  Oriented  Insight:  Developing/Improving  Engagement in Therapy:  Limited  Modes of Intervention:   Education, Support and Processing, Activity  Ambrose MantleMareida Grossman-Orr, LCSW 09/25/2015   4:14 PM
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
## 2015-09-26 NOTE — Progress Notes (Signed)
Patient ID: [name], male   DOB: 1991-06-14, 24 y.o.   MRN: [id number]  DAR: Pt. Denies HI and A/V Hallucinations. He reports passive SI that comes and goes, he is able to contract for safety. Patient does not report any pain or discomfort at this time. He rates his depression, anxiety, and hopelessness at 6/10. He reports sleep is good, appetite is good, energy level is normal, and concentration is good. Support and encouragement provided to the patient. Scheduled medications administered to patient per physician's orders. Patient is receptive and cooperative. He is seen in the milieu interacting with his peers and staff. Patient can be inappropriate at times but is easily redirectable. Q15 minute checks are maintained for safety.

## 2015-09-26 NOTE — [facility] Group Notes (Signed)
[facility] LCSW Group Therapy  09/26/2015 1:15pm  Type of Therapy:  Group Therapy vercoming Obstacles  Pt did not attend, declined invitation.    Chad CordialLauren Carter, LCSWA 09/26/2015 3:37 PM

## 2015-09-26 NOTE — [facility] Group Notes (Signed)
[facility]  09/26/2015 8:45 AM  Participation Quality: Alert, Appropriate and Oriented  Mood/Affect: Appropriate  Depression Rating: 5-6  Anxiety Rating: 5-6  Thoughts of Suicide: "off and on"  Will you contract for safety? Yes  Current AVH: Pt denies  Plan for Discharge/Comments: Pt attended discharge planning group and actively participated in group. CSW discussed suicide prevention education with the group and encouraged them to discuss discharge planning and any relevant barriers. Pt reports that he will be "ready on Wednesday or Thursday" and that his depression is improving. Pt declines [facility] referral and requests list of [facility].  Transportation Means: Pt reports access to transportation  Supports: No supports mentioned at this time  Chad CordialLauren Carter, LCSWA 09/26/2015 9:47 AM

## 2015-09-26 NOTE — Progress Notes (Signed)
Adult Psychoeducational Group Note  Date:  09/26/2015 Time:  9:05 PM  Group Topic/Focus:  Wrap-Up Group:   The focus of this group is to help patients review their daily goal of treatment and discuss progress on daily workbooks.  Participation Level:  Active  Participation Quality:  Appropriate  Affect:  Appropriate  Cognitive:  Alert  Insight: Appropriate  Engagement in Group:  Engaged  Modes of Intervention:  Discussion  Additional Comments:  Patient stated having a good day. Patient stated his goal was to work towards discharge tomorrow.   Jessaca Philippi L Aliza Moret 09/26/2015, 9:05 PM

## 2015-09-27 LAB — VALPROIC ACID LEVEL: VALPROIC ACID LVL: 42 ug/mL — AB (ref 50.0–100.0)

## 2015-09-27 MED ORDER — QUETIAPINE FUMARATE 50 MG PO TABS: 50.0000 mg | ORAL_TABLET | Freq: Two times a day (BID) | ORAL | Status: DC

## 2015-09-27 MED ORDER — DIVALPROEX SODIUM ER 500 MG PO TB24
1000.0000 mg | ORAL_TABLET | Freq: Every day | ORAL | Status: DC
  Filled 2015-09-27: qty 2

## 2015-09-27 MED ORDER — NICOTINE 21 MG/24HR TD PT24: 21.0000 mg | MEDICATED_PATCH | Freq: Every day | TRANSDERMAL | Status: DC

## 2015-09-27 MED ORDER — GABAPENTIN 300 MG PO CAPS: 600.0000 mg | ORAL_CAPSULE | Freq: Three times a day (TID) | ORAL | Status: DC

## 2015-09-27 MED ORDER — ALBUTEROL SULFATE HFA 108 (90 BASE) MCG/ACT IN AERS: 1.0000 | INHALATION_SPRAY | RESPIRATORY_TRACT | Status: DC | PRN

## 2015-09-27 MED ORDER — QUETIAPINE FUMARATE 400 MG PO TABS: 400.0000 mg | ORAL_TABLET | Freq: Every day | ORAL | Status: DC

## 2015-09-27 MED ORDER — DIVALPROEX SODIUM ER 500 MG PO TB24: 1000.0000 mg | ORAL_TABLET | Freq: Every day | ORAL | Status: DC

## 2015-09-27 NOTE — Progress Notes (Signed)
  [facility]  Will you be returning to the same living situation after discharge:  No. Pt plans to discharge to an [facility] [place]  At discharge, do you have transportation home?: Yes,  Pt provided with bus pass Do you have the ability to pay for your medications: Yes,  Pt provided with prescriptions  Release of information consent forms completed and in the chart;  Patient's signature needed at discharge.  Patient to Follow up at: Follow-up Information    Follow up with [facility].   Why:  Walk in between 8am-9am Monday through Friday for reassessment and to get set back up with mental health services.    Contact information:   [address] [place], [place] [address] Phone: [phone] Fax: [phone]      Next level of care provider [facility] Link:no  Patient denies SI/HI: Yes,  Pt denies    Safety Planning and Suicide Prevention discussed: Yes,  with Pt; declines family contact  Have you used any form of tobacco in the last 30 days? (Cigarettes, Smokeless Tobacco, Cigars, and/or Pipes): No  Has patient been referred to the Quitline?: N/A patient is not a smoker  [name], [name] 09/27/2015, 9:01 AM

## 2015-09-27 NOTE — Progress Notes (Signed)
Patient ID: [name], male   DOB: September 23, 1991, 24 y.o.   MRN: [id number]  Pt. Denies SI/HI and A/V hallucinations. Belongings returned to patient at time of discharge. Patient denies any pain or discomfort. Discharge instructions and medications were reviewed with patient. Patient verbalized understanding of both medications and discharge instructions. Patient given a bus pass for transportation. Q15 minute safety checks maintained until discharge. No distress upon discharge.

## 2015-09-27 NOTE — Progress Notes (Signed)
D: Pt denies SI/HI/AVH. Pt is pleasant and cooperative. [facility]   A: Pt was offered support and encouragement. Pt was given scheduled medications. Pt was encourage to attend groups. Q 15 minute checks were done for safety.   R:Pt attends groups and interacts well with peers and staff. Pt is taking medication. Pt has no complaints at this time.Pt receptive to treatment and safety maintained on unit.
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
## 2015-09-27 NOTE — Progress Notes (Signed)
Pt declines further discharge planning with [facility]; no longer wants residential treatment.  Chad CordialLauren Carter, LCSWA Clinical Social Work [phone]
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
## 2015-12-14 ENCOUNTER — Encounter (HOSPITAL_COMMUNITY): Payer: Self-pay | Admitting: *Deleted

## 2015-12-14 ENCOUNTER — Emergency Department (HOSPITAL_COMMUNITY)
Admission: EM | Admit: 2015-12-14 | Discharge: 2015-12-16 | Disposition: A | Discharge status: Other Acute Inpatient Hospital | Payer: Medicaid Other | Attending: Emergency Medicine | Admitting: Emergency Medicine

## 2015-12-14 DIAGNOSIS — R45851 Suicidal ideations: Secondary | ICD-10-CM | POA: Diagnosis not present

## 2015-12-14 DIAGNOSIS — F329 Major depressive disorder, single episode, unspecified: Secondary | ICD-10-CM

## 2015-12-14 DIAGNOSIS — F332 Major depressive disorder, recurrent severe without psychotic features: Secondary | ICD-10-CM | POA: Diagnosis not present

## 2015-12-14 DIAGNOSIS — F32A Depression, unspecified: Secondary | ICD-10-CM

## 2015-12-14 LAB — COMPREHENSIVE METABOLIC PANEL
ALT: 24 U/L (ref 17–63)
AST: 24 U/L (ref 15–41)
Albumin: 3.8 g/dL (ref 3.5–5.0)
Alkaline Phosphatase: 72 U/L (ref 38–126)
Anion gap: 9 (ref 5–15)
BUN: 14 mg/dL (ref 6–20)
CHLORIDE: 106 mmol/L (ref 101–111)
CO2: 25 mmol/L (ref 22–32)
Calcium: 9.1 mg/dL (ref 8.9–10.3)
Creatinine, Ser: 0.74 mg/dL (ref 0.61–1.24)
Glucose, Bld: 99 mg/dL (ref 65–99)
POTASSIUM: 4.1 mmol/L (ref 3.5–5.1)
Sodium: 140 mmol/L (ref 135–145)
Total Bilirubin: 0.2 mg/dL — ABNORMAL LOW (ref 0.3–1.2)
Total Protein: 7 g/dL (ref 6.5–8.1)

## 2015-12-14 LAB — CBC
HCT: 39.3 % (ref 39.0–52.0)
Hemoglobin: 12.9 g/dL — ABNORMAL LOW (ref 13.0–17.0)
MCH: 29.7 pg (ref 26.0–34.0)
MCHC: 32.8 g/dL (ref 30.0–36.0)
MCV: 90.3 fL (ref 78.0–100.0)
PLATELETS: 191 10*3/uL (ref 150–400)
RBC: 4.35 MIL/uL (ref 4.22–5.81)
RDW: 13.4 % (ref 11.5–15.5)
WBC: 8.7 10*3/uL (ref 4.0–10.5)

## 2015-12-14 LAB — RAPID URINE DRUG SCREEN, HOSP PERFORMED
Amphetamines: NOT DETECTED
BARBITURATES: NOT DETECTED
Benzodiazepines: NOT DETECTED
Cocaine: NOT DETECTED
Opiates: NOT DETECTED
TETRAHYDROCANNABINOL: POSITIVE — AB

## 2015-12-14 LAB — SALICYLATE LEVEL

## 2015-12-14 LAB — ETHANOL

## 2015-12-14 LAB — ACETAMINOPHEN LEVEL

## 2015-12-14 MED ORDER — ONDANSETRON HCL 4 MG PO TABS
4.0000 mg | ORAL_TABLET | Freq: Three times a day (TID) | ORAL | Status: DC | PRN
  Administered 2015-12-15: 4 mg via ORAL
  Filled 2015-12-14: qty 1

## 2015-12-14 MED ORDER — ALBUTEROL SULFATE HFA 108 (90 BASE) MCG/ACT IN AERS
1.0000 | INHALATION_SPRAY | RESPIRATORY_TRACT | Status: DC | PRN
Start: 2015-12-14 — End: 2015-12-15

## 2015-12-14 MED ORDER — ACETAMINOPHEN 325 MG PO TABS: 650.0000 mg | ORAL_TABLET | ORAL | Status: DC | PRN

## 2015-12-14 MED ORDER — ZOLPIDEM TARTRATE 5 MG PO TABS
5.0000 mg | ORAL_TABLET | Freq: Every evening | ORAL | Status: DC | PRN
  Administered 2015-12-15: 5 mg via ORAL
  Filled 2015-12-14: qty 1

## 2015-12-14 MED ORDER — IBUPROFEN 200 MG PO TABS: 600.0000 mg | ORAL_TABLET | Freq: Four times a day (QID) | ORAL | Status: DC | PRN

## 2015-12-14 MED ORDER — NICOTINE 21 MG/24HR TD PT24
21.0000 mg | MEDICATED_PATCH | Freq: Every day | TRANSDERMAL | Status: DC
  Filled 2015-12-14 (×2): qty 1

## 2015-12-14 MED ORDER — GABAPENTIN 300 MG PO CAPS
600.0000 mg | ORAL_CAPSULE | Freq: Three times a day (TID) | ORAL | Status: DC
  Administered 2015-12-14 – 2015-12-16 (×5): 600 mg via ORAL
  Filled 2015-12-14 (×5): qty 2

## 2015-12-14 MED ORDER — LORAZEPAM 1 MG PO TABS: 1.0000 mg | ORAL_TABLET | Freq: Four times a day (QID) | ORAL | Status: DC | PRN

## 2015-12-14 MED ORDER — DIVALPROEX SODIUM ER 500 MG PO TB24
1000.0000 mg | ORAL_TABLET | Freq: Every day | ORAL | Status: DC
  Administered 2015-12-14 – 2015-12-15 (×2): 1000 mg via ORAL
  Filled 2015-12-14 (×2): qty 2

## 2015-12-14 MED ORDER — QUETIAPINE FUMARATE 50 MG PO TABS
50.0000 mg | ORAL_TABLET | Freq: Two times a day (BID) | ORAL | Status: DC
Start: 2015-12-14 — End: 2015-12-16
  Administered 2015-12-14 – 2015-12-16 (×4): 50 mg via ORAL
  Filled 2015-12-14: qty 1
  Filled 2015-12-14: qty 2
  Filled 2015-12-14: qty 1
  Filled 2015-12-14: qty 2

## 2015-12-14 MED ORDER — QUETIAPINE FUMARATE 100 MG PO TABS
400.0000 mg | ORAL_TABLET | Freq: Every day | ORAL | Status: DC
  Administered 2015-12-14 – 2015-12-15 (×2): 400 mg via ORAL
  Filled 2015-12-14: qty 1
  Filled 2015-12-14: qty 2

## 2015-12-14 MED ORDER — ALUM & MAG HYDROXIDE-SIMETH 200-200-20 MG/5ML PO SUSP
30.0000 mL | ORAL | Status: DC | PRN
  Administered 2015-12-15: 30 mL via ORAL
  Filled 2015-12-14: qty 30

## 2015-12-14 NOTE — ED Notes (Signed)
Denies ETOH or drug use today.

## 2015-12-14 NOTE — ED Notes (Signed)
Pt states, "my anxiety and depression been getting to me and ive been having suicidal thoughts." states that his plan for suicide was to "smother myself with a pillow." states he ran out of his seroquel, cymbalta, neurontin and depakote a couple days ago because he could not afford to refill them.

## 2015-12-14 NOTE — ED Notes (Addendum)
Patient called in main ED waiting area with no response 

## 2015-12-14 NOTE — ED Notes (Signed)
Pt in purple scrubs, wanded by security and staffing has been notified of need for sitter.

## 2015-12-14 NOTE — ED Provider Notes (Signed)
CSN: [id number]     Arrival date & time 12/14/15  1718 History   First MD Initiated Contact with Patient 12/14/15 2246     Chief Complaint  Patient presents with  . Anxiety  . Depression  . Suicidal     HPI Reports increasing depressive symptoms over past several days. Out of his meds. Follows at [facility]. Uncle died two days ago. Thoughts of smothering himself with a pillow. Hx of schizophrenia and suicide attempt.    Past Medical History  Diagnosis Date  . Asthma   . Bipolar depression (HCC)   . Schizophrenia (HCC)   . Suicide attempt (HCC)   . Homelessness   . H/O suicide attempt     attempted hanging, gun to head, cut self   Past Surgical History  Procedure Laterality Date  . No past surgeries     Family History  Problem Relation Age of Onset  . Drug abuse Mother   . Mental illness Mother    Social History  Substance Use Topics  . Smoking status: Current Every Day Smoker -- 1.50 packs/day for 13 years    Types: Cigarettes  . Smokeless tobacco: None  . Alcohol Use: Yes     Comment: last use 1 week.     Review of Systems  All other systems reviewed and are negative.     Allergies  Review of patient's allergies indicates no known allergies.  Home Medications   Prior to Admission medications   Medication Sig Start Date End Date Taking? Authorizing Provider  albuterol (PROVENTIL HFA;VENTOLIN HFA) 108 (90 BASE) MCG/ACT inhaler Inhale 1-2 puffs into the lungs every 4 (four) hours as needed for wheezing or shortness of breath. 09/27/15   Sanjuana Kava, NP  divalproex (DEPAKOTE ER) 500 MG 24 hr tablet Take 2 tablets (1,000 mg total) by mouth at bedtime. For mood stabilization 09/27/15   Sanjuana Kava, NP  gabapentin (NEURONTIN) 300 MG capsule Take 2 capsules (600 mg total) by mouth 3 (three) times daily. For agitation 09/27/15   Sanjuana Kava, NP  nicotine (NICODERM CQ - DOSED IN MG/24 HOURS) 21 mg/24hr patch Place 1 patch (21 mg total) onto the skin daily. For  smoking cessation 09/27/15   Sanjuana Kava, NP  QUEtiapine (SEROQUEL) 400 MG tablet Take 1 tablet (400 mg total) by mouth at bedtime. For mood control 09/27/15   Sanjuana Kava, NP  QUEtiapine (SEROQUEL) 50 MG tablet Take 1 tablet (50 mg total) by mouth 2 (two) times daily. For agitation 09/27/15   Sanjuana Kava, NP   BP 122/57 mmHg  Pulse 65  Temp(Src) 98.1 F (36.7 C) (Oral)  Resp 18  SpO2 100% Physical Exam  Constitutional: He is oriented to person, place, and time. He appears well-developed and well-nourished.  HENT:  Head: Normocephalic and atraumatic.  Eyes: EOM are normal.  Neck: Normal range of motion.  Cardiovascular: Normal rate, regular rhythm, normal heart sounds and intact distal pulses.   Pulmonary/Chest: Effort normal and breath sounds normal. No respiratory distress.  Abdominal: Soft. He exhibits no distension. There is no tenderness.  Musculoskeletal: Normal range of motion.  Neurological: He is alert and oriented to person, place, and time.  Skin: Skin is warm and dry.  Psychiatric:  Depressed. Flat affect. Suicidal ideation  Nursing note and vitals reviewed.   ED Course  Procedures (including critical care time) Labs Review Labs Reviewed  COMPREHENSIVE METABOLIC PANEL - Abnormal; Notable for the following:    Total  Bilirubin 0.2 (*)    All other components within normal limits  ACETAMINOPHEN LEVEL - Abnormal; Notable for the following:    Acetaminophen (Tylenol), Serum <10 (*)    All other components within normal limits  CBC - Abnormal; Notable for the following:    Hemoglobin 12.9 (*)    All other components within normal limits  URINE RAPID DRUG SCREEN, HOSP PERFORMED - Abnormal; Notable for the following:    Tetrahydrocannabinol POSITIVE (*)    All other components within normal limits  ETHANOL  SALICYLATE LEVEL    Imaging Review No results found. I have personally reviewed and evaluated these images and lab results as part of my medical  decision-making.   EKG Interpretation None      MDM   Final diagnoses:  None    Medically clear. TTS to evaluate    Azalia Bilis, MD 12/14/15 2259

## 2015-12-15 ENCOUNTER — Encounter (HOSPITAL_COMMUNITY): Payer: Self-pay | Admitting: Registered Nurse

## 2015-12-15 DIAGNOSIS — F332 Major depressive disorder, recurrent severe without psychotic features: Secondary | ICD-10-CM | POA: Diagnosis not present

## 2015-12-15 DIAGNOSIS — R45851 Suicidal ideations: Secondary | ICD-10-CM | POA: Diagnosis not present

## 2015-12-15 MED ORDER — ALBUTEROL SULFATE (2.5 MG/3ML) 0.083% IN NEBU
2.5000 mg | INHALATION_SOLUTION | RESPIRATORY_TRACT | Status: DC | PRN
  Filled 2015-12-15: qty 3

## 2015-12-15 NOTE — ED Notes (Signed)
Pt amb[facility] from [facility]

## 2015-12-15 NOTE — ED Notes (Signed)
Patient appears pleasant, cooperative at present. Reports he ignored Clinical research associate earlier because he didn't know what I wanted. Endorses SI. Denies HI, AVH at present. Reports on going stomach discomfort and nausea. Reports fluctuating sleep and eating habits in recent weeks. Feels medications may need adjustment.  Encouragement offered. Medicated. Snack provided.  Q 15 safety checks continue.

## 2015-12-15 NOTE — ED Notes (Signed)
Received pt belongings and valuables from security. Pt signed to confirm proper belongings. Belongings will be given to [facility] when transporting pt. Pt verbalized consent for transfer.

## 2015-12-15 NOTE — BH Assessment (Addendum)
Tele Assessment Note   [name] is a caucasian 25 y.o. male presenting to [facility] c/o suicidal ideations onset 2 days ago with plan to smother himself with a pillow. Pt says his uncle died 2 days ago, which he believes contributed to his suicidal thoughts. Pt also says he ran out of his psychiatric medications a few days ago because he couldn't afford them. Last November, the pt also lost his job after being released from jail and came home to find that his wife had left with their children. Pt says he's attempted suicide about 5-10 times in the past and he has multiple inpatient psychiatric hospitalizations, including several at [facility] (most recently in 08/2015). He endorses depressive sx such as fatigue, low self-esteem, insomnia, feelings of guilt, tearfulness, anger outbursts, lack of motivation, and social isolation. He admits to using drugs and alcohol since he was about 25 years old. He reports using heroin, etoh, and THC daily, with last use about 2 days ago. He also endorses intermittent cocaine use and prescription pill abuse, per chart. Pt has a hx of seizures when withdrawing but pt claims he has not had a seizure in over a year. Pt receives medication management from [facility]. He is currently living in a hotel. Pt denies HI, A/VH, and self-harming behaviors. Pt presents with depressed mood and irritable affect. He appears disheveled and drowsy, and he restlessly tosses back and forth in his bed. He provides minimal responses to questions asked by counselor and appears to be a poor historian. Eye contact is poor and pt has to be awoken several times throughout the interview. Speech is of normal rate and tone. Thought process is coherent and relevant with no indication of delusional content. Pt is oriented x4 and cooperative with assessment process despite wanting to sleep.   Disposition: Per Donell Sievert, PA-C, Pt meets inpt criteria. Per Clint Bolder, AC, no appropriate beds at [facility] TTS to seek  placement.  Diagnosis: 296.53 Bipolar I disorder, Depressed, Severe; 304.30 Cannabis use disorder, Severe; 304 Opioid use disorder, Severe; 303. 90 Alcohol use disorder, Severe; 304.20 Cocaine use disorder, Moderate  Past Medical History:  Past Medical History  Diagnosis Date  . Asthma   . Bipolar depression (HCC)   . Schizophrenia (HCC)   . Suicide attempt (HCC)   . Homelessness   . H/O suicide attempt     attempted hanging, gun to head, cut self    Past Surgical History  Procedure Laterality Date  . No past surgeries      Family History:  Family History  Problem Relation Age of Onset  . Drug abuse Mother   . Mental illness Mother     Social History:  reports that he has been smoking Cigarettes.  He has a 19.5 pack-year smoking history. He does not have any smokeless tobacco history on file. He reports that he drinks alcohol. He reports that he uses illicit drugs (Marijuana, Oxycodone, "Crack" cocaine, Heroin, IV, and Cocaine).  Additional Social History:  Alcohol / Drug Use Pain Medications: See PTA med list Prescriptions: See PTA med list Over the Counter: See PTA med list History of alcohol / drug use?: Yes Negative Consequences of Use: Financial, Legal, Personal relationships, Work / School Substance #1 Name of Substance 1: Etoh 1 - Age of First Use: 15 1 - Amount (size/oz): two 40 oz beers 1 - Frequency: Varies 1 - Duration: Ongoing 1 - Last Use / Amount: 1 week ago Substance #2 Name of Substance 2: Heroin  2 - Age of First Use: 15 2 - Amount (size/oz): Varies 2 - Frequency: Daily  2 - Duration: On-going  2 - Last Use / Amount: 1 week ago Substance #3 Name of Substance 3: Marijuana  3 - Age of First Use: 15 3 - Amount (size/oz): 1 Ounce  3 - Frequency: Daily  3 - Duration: Ongoing 3 - Last Use / Amount: 1 week ago  CIWA: CIWA-Ar BP: (!) 113/44 mmHg Pulse Rate: (!) 59 COWS:    PATIENT STRENGTHS: (choose at least two) Ability for insight Average or  above average intelligence Capable of independent living Communication skills Physical Health Supportive family/friends  Allergies: No Known Allergies  Home Medications:  (Not in a hospital admission)  OB/GYN Status:  No LMP for male patient.  General Assessment Data Location of Assessment: [facility] ED TTS Assessment: In system Is this a Tele or Face-to-Face Assessment?: Tele Assessment Is this an Initial Assessment or a Re-assessment for this encounter?: Initial Assessment Marital status: Separated Maiden name: n/a Is patient pregnant?: No Pregnancy Status: No Living Arrangements: Alone (In a hotel) Can pt return to current living arrangement?: Yes Admission Status: Voluntary Is patient capable of signing voluntary admission?: Yes Referral Source: Self/Family/Friend Insurance type: Medicaid     Crisis Care Plan Living Arrangements: Alone (In a hotel) Legal Guardian:  (n/a) Name of Psychiatrist: Vesta Mixer Name of Therapist: [facility]  Education Status Is patient currently in school?: No Current Grade: na Highest grade of school patient has completed: 12 Name of school: na Contact person: na  Risk to self with the past 6 months Suicidal Ideation: Yes-Currently Present Has patient been a risk to self within the past 6 months prior to admission? : Yes Suicidal Intent: No Has patient had any suicidal intent within the past 6 months prior to admission? : No Is patient at risk for suicide?: Yes Suicidal Plan?: Yes-Currently Present Has patient had any suicidal plan within the past 6 months prior to admission? : Yes Specify Current Suicidal Plan: Plan to smother self with a pillow Access to Means: Yes Specify Access to Suicidal Means: Access to pillows What has been your use of drugs/alcohol within the last 12 months?: Etoh, heroin, cocaine, and THC use Previous Attempts/Gestures: Yes How many times?: 4 Other Self Harm Risks: SA Triggers for Past Attempts:  Unknown Intentional Self Injurious Behavior: None Family Suicide History: No Recent stressful life event(s): Loss (Comment), Financial Problems, Legal Issues (Uncle died 2 days ago; Homeless, legal px) Persecutory voices/beliefs?: No Depression: Yes Depression Symptoms: Despondent, Insomnia, Tearfulness, Isolating, Fatigue, Guilt, Loss of interest in usual pleasures, Feeling worthless/self pity, Feeling angry/irritable Substance abuse history and/or treatment for substance abuse?: Yes Suicide prevention information given to non-admitted patients: Not applicable  Risk to Others within the past 6 months Homicidal Ideation: No Does patient have any lifetime risk of violence toward others beyond the six months prior to admission? : No Thoughts of Harm to Others: No Current Homicidal Intent: No Current Homicidal Plan: No Access to Homicidal Means: No Identified Victim: n/a History of harm to others?: No Assessment of Violence: None Noted Violent Behavior Description: No reported hx of violent behavior Does patient have access to weapons?: No Criminal Charges Pending?: No Does patient have a court date: No Is patient on probation?: Unknown  Psychosis Hallucinations: None noted Delusions: None noted  Mental Status Report Appearance/Hygiene: Disheveled Eye Contact: Poor Motor Activity: Restlessness Speech: Logical/coherent Level of Consciousness: Drowsy Mood: Depressed Affect: Irritable Anxiety Level: None Thought Processes:  Coherent, Relevant Judgement: Partial Orientation: Person, Place, Time, Situation Obsessive Compulsive Thoughts/Behaviors: None  Cognitive Functioning Concentration: Poor Memory: Recent Intact IQ: Average Insight: Poor Impulse Control: Poor Appetite: Good Weight Loss: 0 Weight Gain: 0 Sleep: Decreased Total Hours of Sleep: 6 Vegetative Symptoms: None  ADLScreening [facility] Assessment Services) Patient's cognitive ability adequate to safely complete  daily activities?: Yes Patient able to express need for assistance with ADLs?: Yes Independently performs ADLs?: Yes (appropriate for developmental age)  Prior Inpatient Therapy Prior Inpatient Therapy: Yes Prior Therapy Dates: Multiple Prior Therapy Facilty/Provider(s): [facility] and others Reason for Treatment: SI  Prior Outpatient Therapy Prior Outpatient Therapy: Yes Prior Therapy Dates: Current Prior Therapy Facilty/Provider(s): [facility] Reason for Treatment: med management Does patient have an ACCT team?: No Does patient have Intensive In-House Services?  : No Does patient have [facility] services? : Yes Does patient have P4CC services?: No  ADL Screening (condition at time of admission) Patient's cognitive ability adequate to safely complete daily activities?: Yes Is the patient deaf or have difficulty hearing?: No Does the patient have difficulty seeing, even when wearing glasses/contacts?: No Does the patient have difficulty concentrating, remembering, or making decisions?: No Patient able to express need for assistance with ADLs?: Yes Does the patient have difficulty dressing or bathing?: No Independently performs ADLs?: Yes (appropriate for developmental age) Does the patient have difficulty walking or climbing stairs?: No Weakness of Legs: None Weakness of Arms/Hands: None  Home Assistive Devices/Equipment Home Assistive Devices/Equipment: None    Abuse/Neglect Assessment (Assessment to be complete while patient is alone) Physical Abuse: Denies Verbal Abuse: Denies Sexual Abuse: Denies Exploitation of patient/patient's resources: Denies Self-Neglect: Denies Values / Beliefs Cultural Requests During Hospitalization: None Spiritual Requests During Hospitalization: None   Advance Directives (For Healthcare) Does patient have an advance directive?: No Would patient like information on creating an advanced directive?: No - patient declined information    Additional  Information 1:1 In Past 12 Months?: No CIRT Risk: No Elopement Risk: No Does patient have medical clearance?: Yes     Disposition: Per Donell Sievert, PA-C, Pt meets inpt criteria. Per Clint Bolder, AC, no appropriate beds at [facility]. TTS to seek placement. Disposition Initial Assessment Completed for this Encounter: Yes Disposition of Patient: Inpatient treatment program Type of inpatient treatment program: Adult  Cyndie Mull, [facility]  12/15/2015 1:27 AM

## 2015-12-15 NOTE — ED Notes (Signed)
Pt wanded by security. 

## 2015-12-15 NOTE — BHH Counselor (Signed)
TTS Counselor faxed referrals to the following facilities in effort to obtain inpatient placement:  [facility]   - Pt will also be considered for [facility] placement pending upcoming discharges.

## 2015-12-15 NOTE — ED Notes (Signed)
telepsy began.

## 2015-12-15 NOTE — ED Notes (Signed)
[facility] notified on pickup and transportation to [employer].

## 2015-12-15 NOTE — ED Notes (Signed)
Informed provider Campos about bp, he informed RN that pt was fine, no order given

## 2015-12-15 NOTE — ED Notes (Signed)
Bed: [facility] Expected date:  Expected time:  Means of arrival:  Comments: [facility]
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
## 2015-12-15 NOTE — ED Notes (Signed)
Patient was given a snack and drink, Diet was ordered for patient for lunch.

## 2015-12-16 ENCOUNTER — Inpatient Hospital Stay
Admission: EM | Admit: 2015-12-16 | Discharge: 2015-12-20 | DRG: 897 | Disposition: A | Discharge status: Home / Self Care | Payer: Medicaid Other | Source: Intra-hospital | Attending: Psychiatry | Admitting: Psychiatry

## 2015-12-16 DIAGNOSIS — F1220 Cannabis dependence, uncomplicated: Secondary | ICD-10-CM | POA: Diagnosis present

## 2015-12-16 DIAGNOSIS — F332 Major depressive disorder, recurrent severe without psychotic features: Secondary | ICD-10-CM | POA: Diagnosis present

## 2015-12-16 DIAGNOSIS — J45909 Unspecified asthma, uncomplicated: Secondary | ICD-10-CM | POA: Diagnosis present

## 2015-12-16 DIAGNOSIS — F1924 Other psychoactive substance dependence with psychoactive substance-induced mood disorder: Principal | ICD-10-CM | POA: Diagnosis present

## 2015-12-16 DIAGNOSIS — Z79899 Other long term (current) drug therapy: Secondary | ICD-10-CM | POA: Diagnosis not present

## 2015-12-16 DIAGNOSIS — F1123 Opioid dependence with withdrawal: Secondary | ICD-10-CM | POA: Diagnosis present

## 2015-12-16 DIAGNOSIS — F1420 Cocaine dependence, uncomplicated: Secondary | ICD-10-CM | POA: Diagnosis present

## 2015-12-16 DIAGNOSIS — Z915 Personal history of self-harm: Secondary | ICD-10-CM

## 2015-12-16 DIAGNOSIS — F17210 Nicotine dependence, cigarettes, uncomplicated: Secondary | ICD-10-CM | POA: Diagnosis present

## 2015-12-16 DIAGNOSIS — R45851 Suicidal ideations: Secondary | ICD-10-CM | POA: Diagnosis present

## 2015-12-16 DIAGNOSIS — Z8249 Family history of ischemic heart disease and other diseases of the circulatory system: Secondary | ICD-10-CM | POA: Diagnosis not present

## 2015-12-16 DIAGNOSIS — F1020 Alcohol dependence, uncomplicated: Secondary | ICD-10-CM | POA: Diagnosis present

## 2015-12-16 DIAGNOSIS — Z818 Family history of other mental and behavioral disorders: Secondary | ICD-10-CM

## 2015-12-16 DIAGNOSIS — Z813 Family history of other psychoactive substance abuse and dependence: Secondary | ICD-10-CM | POA: Diagnosis not present

## 2015-12-16 DIAGNOSIS — F17200 Nicotine dependence, unspecified, uncomplicated: Secondary | ICD-10-CM | POA: Diagnosis present

## 2015-12-16 MED ORDER — INFLUENZA VAC SPLIT QUAD 0.5 ML IM SUSY
0.5000 mL | PREFILLED_SYRINGE | INTRAMUSCULAR | Status: AC
  Administered 2015-12-17: 0.5 mL via INTRAMUSCULAR
  Filled 2015-12-16: qty 0.5

## 2015-12-16 MED ORDER — DIVALPROEX SODIUM 500 MG PO DR TAB
1000.0000 mg | DELAYED_RELEASE_TABLET | Freq: Every day | ORAL | Status: DC
  Administered 2015-12-16: 1000 mg via ORAL
  Filled 2015-12-16: qty 2

## 2015-12-16 MED ORDER — GABAPENTIN 300 MG PO CAPS
600.0000 mg | ORAL_CAPSULE | Freq: Three times a day (TID) | ORAL | Status: DC
  Administered 2015-12-16 – 2015-12-17 (×3): 600 mg via ORAL
  Filled 2015-12-16 (×3): qty 2

## 2015-12-16 MED ORDER — QUETIAPINE FUMARATE 200 MG PO TABS
400.0000 mg | ORAL_TABLET | Freq: Every day | ORAL | Status: DC
  Administered 2015-12-16: 400 mg via ORAL
  Filled 2015-12-16 (×2): qty 2

## 2015-12-16 MED ORDER — ALBUTEROL SULFATE HFA 108 (90 BASE) MCG/ACT IN AERS
2.0000 | INHALATION_SPRAY | RESPIRATORY_TRACT | Status: DC | PRN
  Filled 2015-12-16: qty 6.7

## 2015-12-16 MED ORDER — QUETIAPINE FUMARATE 25 MG PO TABS
50.0000 mg | ORAL_TABLET | Freq: Two times a day (BID) | ORAL | Status: DC
  Administered 2015-12-16 – 2015-12-17 (×2): 50 mg via ORAL
  Filled 2015-12-16 (×2): qty 2

## 2015-12-16 NOTE — ED Notes (Signed)
Pt is sleeping all morning,  Compliant with meds.  Denies AVH but is continuing to have SI.  15 minute checks and video monitoring continue.

## 2015-12-16 NOTE — ED Notes (Signed)
Pt discharged ambulatory with [facility] driver.  All belongings were returned to pt.  Pt was in no distress.

## 2015-12-16 NOTE — Tx Team (Signed)
Initial Interdisciplinary Treatment Plan   PATIENT STRESSORS: Financial difficulties Loss of uncle Marital or family conflict Occupational concerns Substance abuse Traumatic event   PATIENT STRENGTHS: Ability for insight Average or above average intelligence Communication skills General fund of knowledge Physical Health   PROBLEM LIST: Problem List/Patient Goals Date to be addressed Date deferred Reason deferred Estimated date of resolution  "uncle passing" 12/16/15     "get back on medication" 12/16/15     "get back on track" 12/16/15     suicidality 12/16/15                                    DISCHARGE CRITERIA:  Ability to meet basic life and health needs Adequate post-discharge living arrangements Improved stabilization in mood, thinking, and/or behavior Reduction of life-threatening or endangering symptoms to within safe limits Verbal commitment to aftercare and medication compliance  PRELIMINARY DISCHARGE PLAN: Outpatient therapy  PATIENT/FAMIILY INVOLVEMENT: This treatment plan has been presented to and reviewed with the patient, [name].  The patient and family have been given the opportunity to ask questions and make suggestions.  [name] 12/16/2015, 5:42 PM

## 2015-12-16 NOTE — Progress Notes (Signed)
D:  Patient is a 25  year-old male admitted to [facility] ambulatory without difficulty.  Patient is alert and oriented upon admission.   A:  Admission assessment completed without difficulty.  Skin and contraband assessment completed with no skin abnormalities nor contraband found.  Q.15 minute safety checks were implemented at the time of admission.  Patient was oriented to the unit and escorted to room #324-B. R:  Patient was receptive to and cooperative with admission assessment.  Patient endorses suicidal ideation at the time of admission. Patient contracts for safety on the unit at this time

## 2015-12-16 NOTE — Progress Notes (Signed)
Pt was accepted to [facility] by Dr. Toni Amend. Pt's bed#324 and call report to [phone].  Pt can be transported now.     Maryelizabeth Rowan, MSW, [facility] Triage Specialist [phone]

## 2015-12-16 NOTE — ED Provider Notes (Signed)
15:15- patient has been accepted at [facility], psychiatric unit. He is alert, calm, cooperative, understands that he has a new destination is comfortable with that. He expresses no needs at this time  Mancel Bale, MD 12/16/15 1521

## 2015-12-16 NOTE — Progress Notes (Signed)
This Clinical research associate spoke with Nedra Hai in [facility] registration [phone] and provided him with the transfer information over the phone per his request.  The patient's voluntary consent form was faxed to the [facility] unit.     Maryelizabeth Rowan, MSW, [facility] Triage Specialist [phone]
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
## 2015-12-16 NOTE — Consult Note (Signed)
  Psychiatry: Patient was presented to me by TTS on the phone in transfer from [facility]. Chart briefly reviewed. Labs and vitals reviewed. 25 year old man with a history of major depression versus bipolar disorder possible PTSD. Reportedly continuing to voice suicidal ideation. Patient will be continued on current medication combination. Admission orders done. Continuous observation. Check appropriate labs.

## 2015-12-17 DIAGNOSIS — F332 Major depressive disorder, recurrent severe without psychotic features: Secondary | ICD-10-CM

## 2015-12-17 LAB — LIPID PANEL
CHOL/HDL RATIO: 4.3 ratio
Cholesterol: 171 mg/dL (ref 0–200)
HDL: 40 mg/dL — AB (ref >40)
LDL CALC: 74 mg/dL (ref 0–99)
TRIGLYCERIDES: 285 mg/dL — AB (ref <150)
VLDL: 57 mg/dL — AB (ref 0–40)

## 2015-12-17 LAB — TSH: TSH: 0.84 u[IU]/mL (ref 0.350–4.500)

## 2015-12-17 MED ORDER — ALUM & MAG HYDROXIDE-SIMETH 200-200-20 MG/5ML PO SUSP: 30.0000 mL | ORAL | Status: DC | PRN

## 2015-12-17 MED ORDER — MAGNESIUM HYDROXIDE 400 MG/5ML PO SUSP: 30.0000 mL | Freq: Every day | ORAL | Status: DC | PRN

## 2015-12-17 MED ORDER — TRAZODONE HCL 100 MG PO TABS
100.0000 mg | ORAL_TABLET | Freq: Every day | ORAL | Status: DC
  Administered 2015-12-17 – 2015-12-18 (×2): 100 mg via ORAL
  Filled 2015-12-17 (×2): qty 1

## 2015-12-17 MED ORDER — QUETIAPINE FUMARATE 25 MG PO TABS
50.0000 mg | ORAL_TABLET | Freq: Two times a day (BID) | ORAL | Status: DC
  Administered 2015-12-17 – 2015-12-19 (×4): 50 mg via ORAL
  Filled 2015-12-17 (×5): qty 2

## 2015-12-17 MED ORDER — ACETAMINOPHEN 325 MG PO TABS: 650.0000 mg | ORAL_TABLET | Freq: Four times a day (QID) | ORAL | Status: DC | PRN

## 2015-12-17 MED ORDER — QUETIAPINE FUMARATE 200 MG PO TABS
400.0000 mg | ORAL_TABLET | Freq: Every day | ORAL | Status: DC
Start: 2015-12-17 — End: 2015-12-19
  Administered 2015-12-17 – 2015-12-18 (×2): 400 mg via ORAL
  Filled 2015-12-17 (×2): qty 2

## 2015-12-17 MED ORDER — NICOTINE 21 MG/24HR TD PT24
21.0000 mg | MEDICATED_PATCH | Freq: Every day | TRANSDERMAL | Status: DC
  Administered 2015-12-18 – 2015-12-20 (×3): 21 mg via TRANSDERMAL
  Filled 2015-12-17 (×3): qty 1

## 2015-12-17 MED ORDER — INFLUENZA VAC SPLIT QUAD 0.5 ML IM SUSY: 0.5000 mL | PREFILLED_SYRINGE | INTRAMUSCULAR | Status: DC

## 2015-12-17 MED ORDER — DIVALPROEX SODIUM 500 MG PO DR TAB
1000.0000 mg | DELAYED_RELEASE_TABLET | Freq: Every day | ORAL | Status: DC
  Administered 2015-12-17 – 2015-12-18 (×2): 1000 mg via ORAL
  Filled 2015-12-17 (×2): qty 2

## 2015-12-17 MED ORDER — ALBUTEROL SULFATE HFA 108 (90 BASE) MCG/ACT IN AERS
2.0000 | INHALATION_SPRAY | RESPIRATORY_TRACT | Status: DC | PRN
  Filled 2015-12-17: qty 6.7

## 2015-12-17 MED ORDER — GABAPENTIN 300 MG PO CAPS
600.0000 mg | ORAL_CAPSULE | Freq: Three times a day (TID) | ORAL | Status: DC
  Administered 2015-12-17 – 2015-12-20 (×9): 600 mg via ORAL
  Filled 2015-12-17 (×9): qty 2

## 2015-12-17 NOTE — [facility] Suicide Risk Assessment (Signed)
[facility] Admission Suicide Risk Assessment   Nursing information obtained from:    Demographic factors:    Current Mental Status:    Loss Factors:    Historical Factors:    Risk Reduction Factors:     Total Time spent with patient: 1 hour Principal Problem: MDD (major depressive disorder), recurrent severe, without psychosis ([facility]) Diagnosis:   Patient Active Problem List   Diagnosis Date Noted  . Tobacco use disorder [F17.200] 12/16/2015  . Suicidal ideation [R45.851]   . MDD (major depressive disorder), recurrent severe, without psychosis ([facility]) [F33.2] 01/05/2015  . Cannabis use disorder, severe, dependence ([facility]) [F12.20]   . PTSD (post-traumatic stress disorder) [F43.10] 09/08/2014  . Asthma [J45.909] 06/11/2012    Class: Chronic   Subjective Data: Depression, suicidal ideation with a plan, substance abuse.  Continued Clinical Symptoms:  Alcohol Use Disorder Identification Test Final Score (AUDIT): 5 The "Alcohol Use Disorders Identification Test", Guidelines for Use in Primary Care, Second Edition.  World Science writer [facility]). Score between 0-7:  no or low risk or alcohol related problems. Score between 8-15:  moderate risk of alcohol related problems. Score between 16-19:  high risk of alcohol related problems. Score 20 or above:  warrants further diagnostic evaluation for alcohol dependence and treatment.   CLINICAL FACTORS:   Depression:   Impulsivity Alcohol/Substance Abuse/Dependencies   Musculoskeletal: Strength & Muscle Tone: within normal limits Gait & Station: normal Patient leans: N/A  Psychiatric Specialty Exam: Review of Systems  Neurological: Positive for tremors.  Psychiatric/Behavioral: Positive for depression, suicidal ideas and substance abuse.  All other systems reviewed and are negative.   Blood pressure 120/53, pulse 42, temperature 98.3 F (36.8 C), temperature source Oral, resp. rate 18, height  (1.676 m), weight 78.926 kg (174 lb),  SpO2 100 %.Body mass index is 28.1 kg/(m^2).  General Appearance: Casual  Eye Contact::  Good  Speech:  Clear and Coherent  Volume:  Normal  Mood:  Anxious  Affect:  Blunt  Thought Process:  Goal Directed  Orientation:  Full (Time, Place, and Person)  Thought Content:  WDL  Suicidal Thoughts:  Yes.  with intent/plan  Homicidal Thoughts:  No  Memory:  Immediate;   Fair Recent;   Fair Remote;   Fair  Judgement:  Impaired  Insight:  Lacking  Psychomotor Activity:  Decreased  Concentration:  Fair  Recall:  [employer] of Knowledge:Fair  Language: Fair  Akathisia:  No  Handed:  Right  AIMS (if indicated):     Assets:  Communication Skills Desire for Improvement Physical Health Resilience  Sleep:  Number of Hours: 8.75  Cognition: WNL  ADL's:  Intact    COGNITIVE FEATURES THAT CONTRIBUTE TO RISK:  None    SUICIDE RISK:   Moderate:  Frequent suicidal ideation with limited intensity, and duration, some specificity in terms of plans, no associated intent, good self-control, limited dysphoria/symptomatology, some risk factors present, and identifiable protective factors, including available and accessible social support.  PLAN OF CARE: Hospital admission, medication management, substance abuse counseling discharge planning.  Mr. Douthat is a 25 year old male with a history of depression, mood instability, and substance abuse admitting for worsening of depression in the context of major loss and medication discontinuation.  1. Suicidal ideation. Still suicidal but is able to contact for safety hospital.  2. Mood. He has been maintained on a combination of Depakote and Neurontin and Seroquel. We restarted on medications.  3. Asthma. He is on inhaler.  4. Smoking. Nicotine patches  available.  5. Discharge. He will be discharged back to his hotel. He will follow up with [facility].  I certify that inpatient services furnished can reasonably be expected to improve the patient's  condition.   Kristine Linea, MD 12/17/2015, 1:50 PM
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
## 2015-12-17 NOTE — Progress Notes (Signed)
D:  Patient is alert and oriented on the unit this shift.  Patient skipped breakfast and remained in bed until prompted to get up and take medications at 1000 today.  Patient endorses suicidal ideation this am.  Patient contracts for safety on the unit this shift.  Patient denies homicidal ideation, auditory or visual hallucinations at the present time.   A:  Scheduled medications are administered to patient as per MD orders.  Emotional support and encouragement are provided.  Patient is maintained on q.15 minute safety checks.  Patient is informed to notify staff with questions or concerns. R:  No adverse medication reactions are noted.  Patient is cooperative with medication administration today.  Patient is receptive, calm and cooperative on the unit at this time.  Patient contracts for safety at this time.  Patient remains safe at this time.

## 2015-12-17 NOTE — [facility] Group Notes (Signed)
[facility] LCSW Group Therapy  12/17/2015 4:12 PM  Type of Therapy:  Group Therapy  Participation Level:  Did Not Attend  Modes of Intervention:  Discussion, Education, Socialization and Support  Summary of Progress/Problems: Pt will identify unhealthy thoughts and how they impact their emotions and behavior. Pt will be encouraged to discuss these thoughts, emotions and behaviors with the group.   Kendra Grissett L Jsean Taussig MSW, LCSWA  12/17/2015, 4:12 PM   

## 2015-12-17 NOTE — Plan of Care (Signed)
Problem: Diagnosis: Increased Risk For Suicide Attempt Goal: LTG-Patient Will Show Positive Response to Medication LTG (by discharge) : Patient will show positive response to medication and will participate in the development of the discharge plan.  Patient complaint with meds.

## 2015-12-17 NOTE — Plan of Care (Signed)
Problem: Ineffective individual coping Goal: LTG: Patient will report a decrease in negative feelings Outcome: Not Progressing Patient reports that he does not feel any better today than he did yesterday

## 2015-12-17 NOTE — Progress Notes (Signed)
D: Pt denies SI/HI/AVH. Pt is irritable at times but cooperative with care. Affect is blunted, brightens upon approach.    Pt is not interacting with peers and staff A: Pt was offered support and encouragement. Pt was given scheduled medications. Pt was encouraged to attend groups. Q 15 minute checks were done for safety.  R:Pt attends groups and interacts well with peers and staff. Pt is taking medication. Pt has no complaints.Pt receptive to treatment and safety maintained on unit.

## 2015-12-17 NOTE — Plan of Care (Signed)
Problem: Ineffective individual coping Goal: STG: Patient will remain free from self harm Outcome: Progressing Patient remains free from self harm this shift     

## 2015-12-17 NOTE — H&P (Signed)
Psychiatric Admission Assessment Adult  Patient Identification: [name] MRN:  [id number] Date of Evaluation:  12/17/2015 Chief Complaint:  Major Depressive Disorder Principal Diagnosis: MDD (major depressive disorder), recurrent severe, without psychosis (HCC) Diagnosis:   Patient Active Problem List   Diagnosis Date Noted  . Bipolar disorder (HCC) [F31.9] 12/17/2015  . Major depressive disorder, recurrent, severe without psychotic features (HCC) [F33.2] 12/17/2015  . Tobacco use disorder [F17.200] 12/16/2015  . Suicidal ideation [R45.851]   . MDD (major depressive disorder), recurrent severe, without psychosis (HCC) [F33.2] 01/05/2015  . Cannabis use disorder, severe, dependence (HCC) [F12.20]   . PTSD (post-traumatic stress disorder) [F43.10] 09/08/2014  . Asthma [J45.909] 06/11/2012    Class: Chronic   History of Present Illness:  Identifying data. [name] is a 25 year old male with a history of depression, anxiety, suicide attempts, and substance use.  Chief complaint. "My uncle died."  History of present illness. Information was obtained from the patient and the chart. The patient has a long history of depression and mood instability with multiple psychiatric hospitalizations. He returns to the hospital in the context of major loss. His uncle who was 81 years old died suddenly of heart attack. Apparently he was the only supports the patient has now. He developed symptoms of depression with poor sleep, decreased appetite, anhedonia, feeling of guilt and hopelessness worthlessness, poor energy and concentration, social isolation crying spells this culminated in suicidal thinking with a plan to smother himself with a pillow or hang himself. In addition to major loss at the patient has not been taking his medications for a few days. According to some sources he did not have money according to another he did not get them. He denies symptoms of anxiety or psychosis. There are no  symptoms suggestive of bipolar mania. He reports using alcohol, cannabis, cocaine but mostly heroine. Of note he was negative for substances except for cannabis.   Past psychiatric history. Multiple psychiatric hospitalizations for depression and substance use. He reports multiple suicide attempts by overdose hanging Taking jumping off the bridge. It is unclear how he survived all the attempts.   Family psychiatric history. Mother and brother with depression. They both attempted suicides.  Social history. He is divorced. He reports that he believes in extended stay hotel. He cannot explain how he pays his bills.  Total Time spent with patient: 1 hour  Past Psychiatric History:  Depression and substance use.  Is the patient at risk to self? Yes.    Has the patient been a risk to self in the past 6 months? No.  Has the patient been a risk to self within the distant past? Yes.    Is the patient a risk to others? No.  Has the patient been a risk to others in the past 6 months? No.  Has the patient been a risk to others within the distant past? No.   Prior Inpatient Therapy:   Prior Outpatient Therapy:    Alcohol Screening: 1. How often do you have a drink containing alcohol?: 4 or more times a week 2. How many drinks containing alcohol do you have on a typical day when you are drinking?: 1 or 2 3. How often do you have six or more drinks on one occasion?: Less than monthly Preliminary Score: 1 4. How often during the last year have you found that you were not able to stop drinking once you had started?: Never 5. How often during the last year have you failed to do  what was normally expected from you becasue of drinking?: Never 6. How often during the last year have you needed a first drink in the morning to get yourself going after a heavy drinking session?: Never 7. How often during the last year have you had a feeling of guilt of remorse after drinking?: Never 8. How often during the last  year have you been unable to remember what happened the night before because you had been drinking?: Never 9. Have you or someone else been injured as a result of your drinking?: No 10. Has a relative or friend or a doctor or another health worker been concerned about your drinking or suggested you cut down?: No Alcohol Use Disorder Identification Test Final Score (AUDIT): 5 Brief Intervention: AUDIT score less than 7 or less-screening does not suggest unhealthy drinking-brief intervention not indicated Substance Abuse History in the last 12 months:  Yes.   Consequences of Substance Abuse: Negative Previous Psychotropic Medications: Yes  Psychological Evaluations: No  Past Medical History:  Past Medical History  Diagnosis Date  . Asthma   . Bipolar depression (HCC)   . Schizophrenia (HCC)   . Suicide attempt (HCC)   . Homelessness   . H/O suicide attempt     attempted hanging, gun to head, cut self    Past Surgical History  Procedure Laterality Date  . No past surgeries     Family History:  Family History  Problem Relation Age of Onset  . Drug abuse Mother   . Mental illness Mother    Family Psychiatric  History:  Brother and mother with depression. Tobacco Screening: @FLOW ([phone])::1)@ Social History:  History  Alcohol Use  . Yes    Comment: last use 1 week.      History  Drug Use  . Yes  . Special: Marijuana, Oxycodone, "Crack" cocaine, Heroin, IV, Cocaine    Comment: heroin -2 days ago cocaine-2 days     Additional Social History:                           Allergies:  No Known Allergies Lab Results: No results found for this or any previous visit (from the past 48 hour(s)).  Blood Alcohol level:  Lab Results  Component Value Date   [facility] <5 12/14/2015   ETH <5 09/13/2015    Metabolic Disorder Labs:  Lab Results  Component Value Date   HGBA1C 5.5 09/09/2014   MPG 111 09/09/2014   No results found for: PROLACTIN Lab Results  Component  Value Date   CHOL 215* 09/09/2014   TRIG 103 09/09/2014   HDL 64 09/09/2014   CHOLHDL 3.4 09/09/2014   VLDL 21 09/09/2014   LDLCALC 130* 09/09/2014    Current Medications: Current Facility-Administered Medications  Medication Dose Route Frequency Provider Last Rate Last Dose  . acetaminophen (TYLENOL) tablet 650 mg  650 mg Oral Q6H PRN Audery Amel, MD      . acetaminophen (TYLENOL) tablet 650 mg  650 mg Oral Q6H PRN Lorella Gomez B Alexxia Stankiewicz, MD      . albuterol (PROVENTIL HFA;VENTOLIN HFA) 108 (90 Base) MCG/ACT inhaler 2 puff  2 puff Inhalation Q4H PRN Audery Amel, MD      . alum & mag hydroxide-simeth (MAALOX/MYLANTA) 200-200-20 MG/5ML suspension 30 mL  30 mL Oral Q4H PRN Audery Amel, MD      . alum & mag hydroxide-simeth (MAALOX/MYLANTA) 200-200-20 MG/5ML suspension 30 mL  30 mL Oral  Q4H PRN Shari Prows, MD      . divalproex (DEPAKOTE) DR tablet 1,000 mg  1,000 mg Oral QHS Audery Amel, MD      . gabapentin (NEURONTIN) capsule 600 mg  600 mg Oral TID Audery Amel, MD      . Influenza vac split quadrivalent PF (FLUARIX) injection 0.5 mL  0.5 mL Intramuscular Tomorrow-1000 John T Clapacs, MD      . magnesium hydroxide (MILK OF MAGNESIA) suspension 30 mL  30 mL Oral Daily PRN Audery Amel, MD      . magnesium hydroxide (MILK OF MAGNESIA) suspension 30 mL  30 mL Oral Daily PRN Shari Prows, MD      . Melene Muller ON 12/18/2015] nicotine (NICODERM CQ - dosed in mg/24 hours) patch 21 mg  21 mg Transdermal Q0600 Khristi Schiller B Javonn Gauger, MD      . QUEtiapine (SEROQUEL) tablet 400 mg  400 mg Oral QHS John T Clapacs, MD      . QUEtiapine (SEROQUEL) tablet 50 mg  50 mg Oral BID Audery Amel, MD      . traZODone (DESYREL) tablet 100 mg  100 mg Oral QHS Aws Shere B Sheana Bir, MD       PTA Medications: Prescriptions prior to admission  Medication Sig Dispense Refill Last Dose  . albuterol (PROVENTIL HFA;VENTOLIN HFA) 108 (90 BASE) MCG/ACT inhaler Inhale 1-2 puffs into the lungs  every 4 (four) hours as needed for wheezing or shortness of breath. 1 Inhaler 0 Past Week at Unknown time  . divalproex (DEPAKOTE ER) 500 MG 24 hr tablet Take 2 tablets (1,000 mg total) by mouth at bedtime. For mood stabilization 30 tablet 0 Past Week at Unknown time  . gabapentin (NEURONTIN) 300 MG capsule Take 2 capsules (600 mg total) by mouth 3 (three) times daily. For agitation 180 capsule 0 Past Week at Unknown time  . nicotine (NICODERM CQ - DOSED IN MG/24 HOURS) 21 mg/24hr patch Place 1 patch (21 mg total) onto the skin daily. For smoking cessation 28 patch 0 Past Week at Unknown time  . QUEtiapine (SEROQUEL) 400 MG tablet Take 1 tablet (400 mg total) by mouth at bedtime. For mood control 30 tablet 0 Past Week at Unknown time  . QUEtiapine (SEROQUEL) 50 MG tablet Take 1 tablet (50 mg total) by mouth 2 (two) times daily. For agitation 60 tablet 0 Past Week at Unknown time    Musculoskeletal: Strength & Muscle Tone: within normal limits Gait & Station: normal Patient leans: N/A  Psychiatric Specialty Exam: Physical Exam  Nursing note and vitals reviewed. Constitutional: He is oriented to person, place, and time. He appears well-developed and well-nourished.  HENT:  Head: Normocephalic and atraumatic.  Eyes: Conjunctivae and EOM are normal. Pupils are equal, round, and reactive to light.  Neck: Normal range of motion. Neck supple.  Cardiovascular: Normal rate, regular rhythm and normal heart sounds.   Respiratory: Effort normal.  GI: Soft. Bowel sounds are normal.  Musculoskeletal: Normal range of motion.  Neurological: He is alert and oriented to person, place, and time.  Skin: Skin is warm and dry.    Review of Systems  Neurological: Positive for tremors.  Psychiatric/Behavioral: Positive for depression, suicidal ideas and substance abuse.  All other systems reviewed and are negative.   Blood pressure 120/53, pulse 42, temperature 98.3 F (36.8 C), temperature source Oral,  resp. rate 18, height  (1.676 m), weight 78.926 kg (174 lb), SpO2 100 %.Body mass index  is 28.1 kg/(m^2).  General Appearance: Casual  Eye Contact::  Fair  Speech:  Clear and Coherent  Volume:  Normal  Mood:  Depressed, Hopeless and Worthless  Affect:  Blunt  Thought Process:  Goal Directed  Orientation:  Full (Time, Place, and Person)  Thought Content:  WDL  Suicidal Thoughts:  Yes.  with intent/plan  Homicidal Thoughts:  No  Memory:  Immediate;   Fair Recent;   Fair Remote;   Fair  Judgement:  Impaired  Insight:  Lacking  Psychomotor Activity:  Normal  Concentration:  Fair  Recall:  [employer] of Knowledge:Fair  Language: Fair  Akathisia:  No  Handed:  Right  AIMS (if indicated):     Assets:  Communication Skills Desire for Improvement Physical Health Resilience  ADL's:  Intact  Cognition: WNL  Sleep:  Number of Hours: 8.75     Treatment Plan Summary: Daily contact with patient to assess and evaluate symptoms and progress in treatment and Medication management   Mr. Totzke is a 25 year old male with a history of depression, mood instability, and substance abuse admitting for worsening of depression in the context of major loss and medication discontinuation.  1. Suicidal ideation. Still suicidal but is able to contact for safety hospital.  2. Mood. He has been maintained on a combination of Depakote and Neurontin and Seroquel. We restarted on medications.  3. Asthma. He is on inhaler.  4. Smoking. Nicotine patches available.  5. Discharge. He will be discharged back to his hotel. He will follow up with [facility].  Observation Level/Precautions:  15 minute checks  Laboratory:  CBC Chemistry Profile UDS UA  Psychotherapy:    Medications:    Consultations:    Discharge Concerns:    Estimated LOS:  Other:     I certify that inpatient services furnished can reasonably be expected to improve the patient's condition.    Kristine Linea,  MD 2/25/20171:58 PM

## 2015-12-18 LAB — PROLACTIN: Prolactin: 14.6 ng/mL (ref 4.0–15.2)

## 2015-12-18 LAB — HEMOGLOBIN A1C: HEMOGLOBIN A1C: 5 % (ref 4.0–6.0)

## 2015-12-18 NOTE — Progress Notes (Addendum)
Sad affect, cooperative behavior with meals, meds and plan of care. No SI/HI at this time. Quiet with peers, verbalizes needs appropriately to staff. Safety maintained. MD into visit. No distress, no complaint.

## 2015-12-18 NOTE — Progress Notes (Signed)
D: Observed pt in day room. Patient alert and oriented x4. Patient endorses SI with out a plan, but pt contracts for safety. Pt denies HI/AVH. Pt affect flat. Pt indicated he does not feel that his meds are working yet, as he does not feel any better from when he was admitted. Pt voiced no other complaints. A: Offered active listening and support. Provided therapeutic communication. Administered scheduled medications.  R: Pt pleasant and cooperative. Pt medication compliant. Will continue Q15 min. checks. Safety maintained.

## 2015-12-18 NOTE — [facility] Group Notes (Signed)
[facility] Group Notes:  (Nursing/MHT/Case Management/Adjunct)  Date:  12/18/2015  Time:  11:59 AM  Type of Therapy:  Psychoeducational Skills  Participation Level:  Did Not Attend   Lynelle Smoke [facility] 12/18/2015, 11:59 AM

## 2015-12-18 NOTE — [facility] Group Notes (Signed)
[facility] Group Notes:  (Nursing/MHT/Case Management/Adjunct)  Date:  12/18/2015  Time:  12:30 AM  Type of Therapy:  Group Therapy  Participation Level:  Active  Participation Quality:  Attentive  Affect:  Appropriate  Cognitive:  Appropriate  Insight:  Appropriate  Engagement in Group:  None  Modes of Intervention:  Support  Summary of Progress/Problems:  [name] 12/18/2015, 12:30 AM

## 2015-12-18 NOTE — Progress Notes (Signed)
[facility] MD Progress Note  12/18/2015 3:02 PM [name]  MRN:  [id number]  Subjective:  [name] is still depressed, hopeless and suicidal. He hears voices. He has been in his room most of the day sleeping. There are no somatic complaints. He tolerates medications well.   Principal Problem: MDD (major depressive disorder), recurrent severe, without psychosis (HCC) Diagnosis:   Patient Active Problem List   Diagnosis Date Noted  . Bipolar disorder (HCC) [F31.9] 12/17/2015  . Major depressive disorder, recurrent, severe without psychotic features (HCC) [F33.2] 12/17/2015  . Tobacco use disorder [F17.200] 12/16/2015  . Suicidal ideation [R45.851]   . MDD (major depressive disorder), recurrent severe, without psychosis (HCC) [F33.2] 01/05/2015  . Cannabis use disorder, severe, dependence (HCC) [F12.20]   . PTSD (post-traumatic stress disorder) [F43.10] 09/08/2014  . Asthma [J45.909] 06/11/2012    Class: Chronic   Total Time spent with patient: 20 minutes  Past Psychiatric History: Depression, mood instability, substances. Past Medical History:  Past Medical History  Diagnosis Date  . Asthma   . Bipolar depression (HCC)   . Schizophrenia (HCC)   . Suicide attempt (HCC)   . Homelessness   . H/O suicide attempt     attempted hanging, gun to head, cut self    Past Surgical History  Procedure Laterality Date  . No past surgeries     Family History:  Family History  Problem Relation Age of Onset  . Drug abuse Mother   . Mental illness Mother    Family Psychiatric  History:See H&P.ocial History:  History  Alcohol Use  . Yes    Comment: last use 1 week.      History  Drug Use  . Yes  . Special: Marijuana, Oxycodone, "Crack" cocaine, Heroin, IV, Cocaine    Comment: heroin -2 days ago cocaine-2 days     Social History   Social History  . Marital Status: Divorced    Spouse Name: N/A  . Number of Children: N/A  . Years of Education: N/A   Social History Main Topics   . Smoking status: Current Every Day Smoker -- 1.50 packs/day for 13 years    Types: Cigarettes  . Smokeless tobacco: None  . Alcohol Use: Yes     Comment: last use 1 week.   . Drug Use: Yes    Special: Marijuana, Oxycodone, "Crack" cocaine, Heroin, IV, Cocaine     Comment: heroin -2 days ago cocaine-2 days   . Sexual Activity: No   Other Topics Concern  . None   Social History Narrative   Additional Social History:                         Sleep: Fair  Appetite:  Fair  Current Medications: Current Facility-Administered Medications  Medication Dose Route Frequency Provider Last Rate Last Dose  . albuterol (PROVENTIL HFA;VENTOLIN HFA) 108 (90 Base) MCG/ACT inhaler 2 puff  2 puff Inhalation Q4H PRN Audery Amel, MD      . alum & mag hydroxide-simeth (MAALOX/MYLANTA) 200-200-20 MG/5ML suspension 30 mL  30 mL Oral Q4H PRN Audery Amel, MD      . divalproex (DEPAKOTE) DR tablet 1,000 mg  1,000 mg Oral QHS Audery Amel, MD   1,000 mg at 12/17/15 2108  . gabapentin (NEURONTIN) capsule 600 mg  600 mg Oral TID Audery Amel, MD   600 mg at 12/18/15 0948  . magnesium hydroxide (MILK OF MAGNESIA) suspension 30 mL  30 mL Oral Daily PRN Audery Amel, MD      . nicotine (NICODERM CQ - dosed in mg/24 hours) patch 21 mg  21 mg Transdermal Q0600 Shari Prows, MD   21 mg at 12/18/15 0650  . QUEtiapine (SEROQUEL) tablet 400 mg  400 mg Oral QHS Audery Amel, MD   400 mg at 12/17/15 2108  . QUEtiapine (SEROQUEL) tablet 50 mg  50 mg Oral BID Audery Amel, MD   50 mg at 12/18/15 0948  . traZODone (DESYREL) tablet 100 mg  100 mg Oral QHS Shari Prows, MD   100 mg at 12/17/15 2108    Lab Results:  Results for orders placed or performed during the hospital encounter of 12/16/15 (from the past 48 hour(s))  Hemoglobin A1c     Status: None   Collection Time: 12/17/15  2:36 PM  Result Value Ref Range   Hgb A1c MFr Bld 5.0 4.0 - 6.0 %  Lipid panel, fasting      Status: Abnormal   Collection Time: 12/17/15  2:36 PM  Result Value Ref Range   Cholesterol 171 0 - 200 mg/dL   Triglycerides 161 (H) <150 mg/dL   HDL 40 (L) >09 mg/dL   Total CHOL/HDL Ratio 4.3 RATIO   VLDL 57 (H) 0 - 40 mg/dL   LDL Cholesterol 74 0 - 99 mg/dL    Comment:        Total Cholesterol/HDL:CHD Risk Coronary Heart Disease Risk Table                     Men   Women  1/2 Average Risk   3.4   3.3  Average Risk       5.0   4.4  2 X Average Risk   9.6   7.1  3 X Average Risk  23.4   11.0        Use the calculated Patient Ratio above and the CHD Risk Table to determine the patient's CHD Risk.        ATP III CLASSIFICATION (LDL):  <100     mg/dL   Optimal  604-540  mg/dL   Near or Above                    Optimal  130-159  mg/dL   Borderline  981-191  mg/dL   High  >478     mg/dL   Very High   TSH     Status: None   Collection Time: 12/17/15  2:36 PM  Result Value Ref Range   TSH 0.840 0.350 - 4.500 uIU/mL  Prolactin     Status: None   Collection Time: 12/17/15  2:36 PM  Result Value Ref Range   Prolactin 14.6 4.0 - 15.2 ng/mL    Comment: (NOTE) Performed At: [facility] [address] [place], [place] [id number] Mila Homer MD MV:[id number]     Blood Alcohol level:  Lab Results  Component Value Date   [facility] <5 12/14/2015   ETH <5 09/13/2015    Physical Findings: AIMS:  , ,  ,  ,    CIWA:    COWS:     Musculoskeletal: Strength & Muscle Tone: within normal limits Gait & Station: normal Patient leans: N/A  Psychiatric Specialty Exam: Review of Systems  Psychiatric/Behavioral: Positive for depression, suicidal ideas, hallucinations and substance abuse.  All other systems reviewed and are negative.   Blood pressure 120/59, pulse  68, temperature 98 F (36.7 C), temperature source Oral, resp. rate 18, height  (1.676 m), weight 78.926 kg (174 lb), SpO2 100 %.Body mass index is 28.1 kg/(m^2).  General Appearance: Disheveled  Eye  Contact::  Minimal  Speech:  Clear and Coherent  Volume:  Normal  Mood:  Depressed, Hopeless and Worthless  Affect:  Congruent  Thought Process:  Goal Directed  Orientation:  Full (Time, Place, and Person)  Thought Content:  WDL  Suicidal Thoughts:  Yes.  with intent/plan  Homicidal Thoughts:  No  Memory:  Immediate;   Fair Recent;   Fair Remote;   Fair  Judgement:  Poor  Insight:  Lacking  Psychomotor Activity:  Decreased  Concentration:  Fair  Recall:  [employer] of Knowledge:Fair  Language: Fair  Akathisia:  No  Handed:  Right  AIMS (if indicated):     Assets:  Communication Skills Desire for Improvement Physical Health Resilience  ADL's:  Intact  Cognition: WNL  Sleep:  Number of Hours: 7.75   Treatment Plan Summary: Daily contact with patient to assess and evaluate symptoms and progress in treatment and Medication management   Mr. Fritts is a 25 year old male with a history of depression, mood instability, and substance abuse admitting for worsening of depression in the context of major loss and medication discontinuation.  1. Suicidal ideation. Still suicidal but is able to contact [facility] safety hospital.  2. Mood. He has been maintained on a combination of Depakote and Neurontin and Seroquel. We restarted on medications.  3. Asthma. He is on inhaler.  4. Smoking. Nicotine patches available.  5. Discharge. He will be discharged back to his hotel. He will follow up with [facility].   Kristine Linea, MD 12/18/2015, 3:02 PM

## 2015-12-18 NOTE — [facility] Group Notes (Signed)
[facility] LCSW Group Therapy  12/18/2015 3:44 PM  Type of Therapy:  Group Therapy  Participation Level:  Did Not Attend   Modes of Intervention:  Discussion, Education, Socialization and Support  Summary of Progress/Problems: Pts were asked to identify what balance means to them. They were encouraged to identify what throws them off balance and how to regain balance.    Donda Friedli L Addilynn Mowrer MSW, LCSWA  12/18/2015, 3:44 PM  

## 2015-12-18 NOTE — Plan of Care (Signed)
Problem: Consults Goal: Suicide Risk Patient Education (See Patient Education module for education specifics)  Outcome: Progressing No SI/HI at this time.      

## 2015-12-18 NOTE — Plan of Care (Signed)
Problem: Ineffective individual coping Goal: STG: Patient will remain free from self harm Outcome: Progressing Pr has remained free from self harm

## 2015-12-19 MED ORDER — IBUPROFEN 800 MG PO TABS
800.0000 mg | ORAL_TABLET | Freq: Once | ORAL | Status: AC
  Administered 2015-12-19: 800 mg via ORAL
  Filled 2015-12-19: qty 1

## 2015-12-19 MED ORDER — TRAZODONE HCL 100 MG PO TABS
150.0000 mg | ORAL_TABLET | Freq: Every day | ORAL | Status: DC
  Administered 2015-12-19: 150 mg via ORAL
  Filled 2015-12-19: qty 1

## 2015-12-19 MED ORDER — LOPERAMIDE HCL 2 MG PO CAPS: 4.0000 mg | ORAL_CAPSULE | ORAL | Status: DC | PRN

## 2015-12-19 MED ORDER — CYCLOBENZAPRINE HCL 10 MG PO TABS
10.0000 mg | ORAL_TABLET | Freq: Three times a day (TID) | ORAL | Status: DC
  Administered 2015-12-19 – 2015-12-20 (×4): 10 mg via ORAL
  Filled 2015-12-19 (×4): qty 1

## 2015-12-19 MED ORDER — IBUPROFEN 600 MG PO TABS: 600.0000 mg | ORAL_TABLET | Freq: Four times a day (QID) | ORAL | Status: DC | PRN

## 2015-12-19 NOTE — Plan of Care (Signed)
Problem: Ineffective individual coping Goal: STG: Patient will remain free from self harm Outcome: Progressing Patient denies SI.     

## 2015-12-19 NOTE — [facility] Group Notes (Signed)
[facility] LCSW Aftercare Discharge Planning Group Note   12/19/2015 12:14 PM  Participation Quality:  Patient arrived 10 minutes late but introduced himself and shared his SMART goal is to "get over my depression and anxiety and go to residential substance abuse program". 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   Mood/Affect:  Appropriate  Depression Rating:  0  Anxiety Rating:  0  Thoughts of Suicide:  No Will you contract for safety?   NA  Current AVH:  No  Plan for Discharge/Comments:  Patient has support but wants residential Tx for substance abuse   Transportation Means: family can provide  Supports: family and place to go but needs inpatient treatment first  Lulu Riding, MSW, LCSWA

## 2015-12-19 NOTE — Plan of Care (Signed)
Problem: Ineffective individual coping Goal: STG: Patient will remain free from self harm Outcome: Progressing Pt safe on the unit at this time     

## 2015-12-19 NOTE — Plan of Care (Signed)
Problem: Ineffective individual coping Goal: STG-Increase in ability to manage activities of daily living Outcome: Progressing Patient is managing his activities of daily living currently     

## 2015-12-19 NOTE — [facility] Group Notes (Signed)
[facility] LCSW Group Therapy  12/19/2015 2:13 PM  Type of Therapy:  Group Therapy  Participation Level:  Did Not Attend  Summary of Progress/Problems: Patient was called to group but did not attend.  Maday Guarino T, MSW, LCSWA 12/19/2015, 2:13 PM  

## 2015-12-19 NOTE — Progress Notes (Signed)
D:  Patient is alert and oriented on the unit this shift.  Patient attended and actively participated in groups today.  Patient endorses suicidal ideation this shift.  Patient contracts for safety on the unit this shift.  Patient denies homicidal ideation, auditory or visual hallucinations at the present time. Patient reports painful leg cramps caused by his "withdrawal" A:  Scheduled medications are administered to patient as per MD orders.  Emotional support and encouragement are provided.  Patient is maintained on q.15 minute safety checks.  Patient is informed to notify staff with questions or concerns. R:  No adverse medication reactions are noted.  Patient is cooperative with medication administration and treatment plan today.  Patient is receptive, calm and cooperative on the unit at this time.  Patient contracts for safety at this time.  Patient remains safe at this time.

## 2015-12-19 NOTE — Plan of Care (Signed)
Problem: Ineffective individual coping Goal: STG: Patient will remain free from self harm Outcome: Progressing Patient remains free from self harm currently     

## 2015-12-19 NOTE — Progress Notes (Signed)
Recreation Therapy Notes  Date: 02.27.17 Time: 3:00 pm Location: Community Room  Group Topic: Wellness  Goal Area(s) Addresses:  Patient will identify at least one item per dimension of health. Patient will examine areas they are deficient in.  Behavioral Response: Arrived late, left early  Intervention: 6 Dimensions of Health  Activity: Patients were given a worksheet with the definitions of the 6 Dimensions of Health on it and a worksheet with each dimension written out. Patients were encouraged to write 2-3 things they are currently doing in each dimension.  Education: LRT educated patients on things they can do to improve each area.  Education Outcome: Patient left in the middle of group discussion when LRT was educating group.   Clinical Observations/Feedback: Patient arrived to group at approximately 3:25 pm. LRT explained activity. Patient did not participate in activity. Patient did not contribute to group discussion. Patient left group at approximately 3:30 pm.  Jacquelynn Cree, LRT/CTRS 12/19/2015 4:01 PM

## 2015-12-19 NOTE — Progress Notes (Signed)
Patient ID: [name], male   DOB: [birth year], 25 y.o.   MRN: [id number] Pt met with [facility] staff, [facility] came and met with Pt and agreed to accept him to their transitional Housing program.  Pt is excited to go.  SW notified Dr. Jerilee Hoh and all parties agreed to discharge tomorrow.  [facility] notified and agreeable to picking Pt up at discharge. Pt verbalizes relief at knowing he has somewhere to go and that this makes him feel much better.  Dossie Arbour, MSW, LCSW

## 2015-12-19 NOTE — Progress Notes (Signed)
D: Pt denies SI/HI/AVH. Pt is pleasant and cooperative, he appears less anxious and he is interacting with peers and staff appropriately.  A: Pt was offered support and encouragement. Pt was given scheduled medications. Pt was encouraged to attend groups. Q 15 minute checks were done for safety.  R:Pt attends groups and interacts well with peers and staff. Pt is taking medication. Pt has no complaints.Pt receptive to treatment and safety maintained on unit.   

## 2015-12-19 NOTE — [facility] Group Notes (Signed)
[facility] Group Notes:  (Nursing/MHT/Case Management/Adjunct)  Date:  12/19/2015  Time:  12:35 PM  Type of Therapy:  Psychoeducational Skills  Participation Level:  Active  Participation Quality:  Appropriate  Affect:  Appropriate  Cognitive:  Appropriate  Insight:  Appropriate  Engagement in Group:  Engaged  Modes of Intervention:  Discussion and Education  Summary of Progress/Problems:  Mickey Farber 12/19/2015, 12:35 PM

## 2015-12-19 NOTE — Progress Notes (Signed)
[facility] MD Progress Note  12/19/2015 1:22 PM [name]  MRN:  [id number]  Subjective:  [name] is still depressed, hopeless and suicidal.  Says he wants inpatient substance abuse treatment. Pt has been using drugs since age 25.  No periods of sobriety since then.  Has been abusing heroin, crack and alcohol. Says he does not want to keep living and thinks about dying as a way to stay with his uncle. Pt says he was raised by his uncle.  His uncle died recently due to "alcohol coma".  [name] took place on Thursday of last week. Both of his parents died when he was teenager. Does not have any other family.  Prior to admission he was living in a [place] and was working odd jobs.  Says he was diagnosed with bipolar when he was 16.  He has been prescribed with seroquel and depakote.  Many admission to [facility] in [place].  Principal Problem: MDD (major depressive disorder), recurrent severe, without psychosis (HCC) Diagnosis:   Patient Active Problem List   Diagnosis Date Noted  . Bipolar disorder (HCC) [F31.9] 12/17/2015  . Major depressive disorder, recurrent, severe without psychotic features (HCC) [F33.2] 12/17/2015  . Tobacco use disorder [F17.200] 12/16/2015  . Suicidal ideation [R45.851]   . MDD (major depressive disorder), recurrent severe, without psychosis (HCC) [F33.2] 01/05/2015  . Cannabis use disorder, severe, dependence (HCC) [F12.20]   . PTSD (post-traumatic stress disorder) [F43.10] 09/08/2014  . Asthma [J45.909] 06/11/2012    Class: Chronic   Total Time spent with patient: 30 minutes  Past Psychiatric History: Depression, mood instability, substances. Past Medical History:  Past Medical History  Diagnosis Date  . Asthma   . Bipolar depression (HCC)   . Schizophrenia (HCC)   . Suicide attempt (HCC)   . Homelessness   . H/O suicide attempt     attempted hanging, gun to head, cut self    Past Surgical History  Procedure Laterality Date  . No past surgeries     Family  History:  Family History  Problem Relation Age of Onset  . Drug abuse Mother   . Mental illness Mother    Family Psychiatric  History:See H&P.ocial History:  History  Alcohol Use  . Yes    Comment: last use 1 week.      History  Drug Use  . Yes  . Special: Marijuana, Oxycodone, "Crack" cocaine, Heroin, IV, Cocaine    Comment: heroin -2 days ago cocaine-2 days     Social History   Social History  . Marital Status: Divorced    Spouse Name: N/A  . Number of Children: N/A  . Years of Education: N/A   Social History Main Topics  . Smoking status: Current Every Day Smoker -- 1.50 packs/day for 13 years    Types: Cigarettes  . Smokeless tobacco: None  . Alcohol Use: Yes     Comment: last use 1 week.   . Drug Use: Yes    Special: Marijuana, Oxycodone, "Crack" cocaine, Heroin, IV, Cocaine     Comment: heroin -2 days ago cocaine-2 days   . Sexual Activity: No   Other Topics Concern  . None   Social History Narrative    Sleep: Fair  Appetite:  Fair  Current Medications: Current Facility-Administered Medications  Medication Dose Route Frequency Provider Last Rate Last Dose  . albuterol (PROVENTIL HFA;VENTOLIN HFA) 108 (90 Base) MCG/ACT inhaler 2 puff  2 puff Inhalation Q4H PRN Audery Amel, MD      .  alum & mag hydroxide-simeth (MAALOX/MYLANTA) 200-200-20 MG/5ML suspension 30 mL  30 mL Oral Q4H PRN Audery Amel, MD      . cyclobenzaprine (FLEXERIL) tablet 10 mg  10 mg Oral TID Jimmy Footman, MD   10 mg at 12/19/15 1102  . gabapentin (NEURONTIN) capsule 600 mg  600 mg Oral TID Audery Amel, MD   600 mg at 12/19/15 1610  . ibuprofen (ADVIL,MOTRIN) tablet 600 mg  600 mg Oral Q6H PRN Jimmy Footman, MD      . loperamide (IMODIUM) capsule 4 mg  4 mg Oral PRN Jimmy Footman, MD      . magnesium hydroxide (MILK OF MAGNESIA) suspension 30 mL  30 mL Oral Daily PRN Audery Amel, MD      . nicotine (NICODERM CQ - dosed in mg/24 hours) patch  21 mg  21 mg Transdermal Q0600 Shari Prows, MD   21 mg at 12/19/15 0954  . traZODone (DESYREL) tablet 150 mg  150 mg Oral QHS Jimmy Footman, MD        Lab Results:  Results for orders placed or performed during the hospital encounter of 12/16/15 (from the past 48 hour(s))  Hemoglobin A1c     Status: None   Collection Time: 12/17/15  2:36 PM  Result Value Ref Range   Hgb A1c MFr Bld 5.0 4.0 - 6.0 %  Lipid panel, fasting     Status: Abnormal   Collection Time: 12/17/15  2:36 PM  Result Value Ref Range   Cholesterol 171 0 - 200 mg/dL   Triglycerides 960 (H) <150 mg/dL   HDL 40 (L) >45 mg/dL   Total CHOL/HDL Ratio 4.3 RATIO   VLDL 57 (H) 0 - 40 mg/dL   LDL Cholesterol 74 0 - 99 mg/dL    Comment:        Total Cholesterol/HDL:CHD Risk Coronary Heart Disease Risk Table                     Men   Women  1/2 Average Risk   3.4   3.3  Average Risk       5.0   4.4  2 X Average Risk   9.6   7.1  3 X Average Risk  23.4   11.0        Use the calculated Patient Ratio above and the CHD Risk Table to determine the patient's CHD Risk.        ATP III CLASSIFICATION (LDL):  <100     mg/dL   Optimal  409-811  mg/dL   Near or Above                    Optimal  130-159  mg/dL   Borderline  914-782  mg/dL   High  >956     mg/dL   Very High   TSH     Status: None   Collection Time: 12/17/15  2:36 PM  Result Value Ref Range   TSH 0.840 0.350 - 4.500 uIU/mL  Prolactin     Status: None   Collection Time: 12/17/15  2:36 PM  Result Value Ref Range   Prolactin 14.6 4.0 - 15.2 ng/mL    Comment: (NOTE) Performed At: [facility] [address] [place], [place] [id number] Mila Homer MD IO:[id number]     Blood Alcohol level:  Lab Results  Component Value Date   [facility] <5 12/14/2015   ETH <5 09/13/2015  Physical Findings: AIMS:  , ,  ,  ,    CIWA:    COWS:     Musculoskeletal: Strength & Muscle Tone: within normal limits Gait & Station: normal Patient  leans: N/A  Psychiatric Specialty Exam: Review of Systems  Psychiatric/Behavioral: Positive for depression, suicidal ideas, hallucinations and substance abuse.  All other systems reviewed and are negative.   Blood pressure 125/71, pulse 65, temperature 97.8 F (36.6 C), temperature source Oral, resp. rate 18, height  (1.676 m), weight 78.926 kg (174 lb), SpO2 100 %.Body mass index is 28.1 kg/(m^2).  General Appearance: Disheveled  Eye Contact::  Minimal  Speech:  Clear and Coherent  Volume:  Normal  Mood:  Depressed, Hopeless and Worthless  Affect:  Congruent  Thought Process:  Goal Directed  Orientation:  Full (Time, Place, and Person)  Thought Content:  WDL  Suicidal Thoughts:  Yes.  with intent/plan  Homicidal Thoughts:  No  Memory:  Immediate;   Fair Recent;   Fair Remote;   Fair  Judgement:  Poor  Insight:  Lacking  Psychomotor Activity:  Decreased  Concentration:  Fair  Recall:  [employer] of Knowledge:Fair  Language: Fair  Akathisia:  No  Handed:  Right  AIMS (if indicated):     Assets:  Communication Skills Desire for Improvement Physical Health Resilience  ADL's:  Intact  Cognition: WNL  Sleep:  Number of Hours: 6.75   Treatment Plan Summary: Daily contact with patient to assess and evaluate symptoms and progress in treatment and Medication management   Mr. Baskerville is a 25 year old male with a history of depression, mood instability, and substance abuse admitting for worsening of depression in the context of major loss and medication discontinuation.  Substance induced mood disorder: no medications appear to be indicated at this time.  Suicidal ideation. Still suicidal (passive)but is able to contact for safety hospital.  Alcohol addiction: continue neurontin  Opioid withdrawal: will order flexeril, ibuprofen and imodium  Alcohol, cocaine and opioid addiction: will try to make referral for substance abuse residential.   Asthma. He is on  inhaler.  Smoking. Nicotine patches available.  Discharge: in the next 24-48 h  [facility]   Jimmy Footman, MD 12/19/2015, 1:22 PM

## 2015-12-19 NOTE — Progress Notes (Signed)
D: Pt denies SI/HI/AV. Pt is pleasant and cooperative. Pt forwards little information due to minimal interaction. Pt got up to take his medications and that was all the interaction seen from the pt. Pt very well known to Clinical research associate from [facility].   A: Pt was offered support and encouragement. Pt was given scheduled medications. Pt was encourage to attend groups. Q 15 minute checks were done for safety.   R: Pt is taking medication. Pt has no complaints at this time .Pt receptive to treatment and safety maintained on unit.

## 2015-12-20 MED ORDER — ATOMOXETINE HCL 60 MG PO CAPS: 60.0000 mg | ORAL_CAPSULE | Freq: Every day | ORAL | Status: DC

## 2015-12-20 MED ORDER — TRAZODONE HCL 150 MG PO TABS: 150.0000 mg | ORAL_TABLET | Freq: Every day | ORAL | Status: DC

## 2015-12-20 MED ORDER — ALBUTEROL SULFATE HFA 108 (90 BASE) MCG/ACT IN AERS: 2.0000 | INHALATION_SPRAY | RESPIRATORY_TRACT | Status: DC | PRN

## 2015-12-20 NOTE — [facility] Suicide Risk Assessment (Signed)
[facility] Discharge Suicide Risk Assessment   Principal Problem: MDD (major depressive disorder), recurrent severe, without psychosis (HCC) Discharge Diagnoses:  Patient Active Problem List   Diagnosis Date Noted  . Bipolar disorder (HCC) [F31.9] 12/17/2015  . Major depressive disorder, recurrent, severe without psychotic features (HCC) [F33.2] 12/17/2015  . Tobacco use disorder [F17.200] 12/16/2015  . Suicidal ideation [R45.851]   . MDD (major depressive disorder), recurrent severe, without psychosis (HCC) [F33.2] 01/05/2015  . Cannabis use disorder, severe, dependence (HCC) [F12.20]   . PTSD (post-traumatic stress disorder) [F43.10] 09/08/2014  . Asthma [J45.909] 06/11/2012    Class: Chronic    Total Time spent with patient: 30 minutes   Psychiatric Specialty Exam: ROS                                                         Mental Status Per Nursing Assessment::   On Admission:     Demographic Factors:  Male, Adolescent or young adult, Caucasian and Living alone  Loss Factors: Loss of significant relationship and Financial problems/change in socioeconomic status  Historical Factors: Prior suicide attempts and Impulsivity  Risk Reduction Factors:   Religious beliefs about death  Continued Clinical Symptoms:  Alcohol/Substance Abuse/Dependencies  Cognitive Features That Contribute To Risk:  None    Suicide Risk:  Minimal: No identifiable suicidal ideation.  Patients presenting with no risk factors but with morbid ruminations; may be classified as minimal risk based on the severity of the depressive symptoms    Jimmy Footman, MD 12/20/2015, 9:12 AM

## 2015-12-20 NOTE — Progress Notes (Signed)
Patient discharged home. DC instructions provided and explained. Medications reviewed. Rx given, all questions answered, pt stable at discharge. Belongings returned, pt denies SI, HI, AVH.

## 2015-12-20 NOTE — Discharge Summary (Addendum)
Physician Discharge Summary Note  Patient:  [name] is an 25 y.o., male MRN:  [id number] DOB:  [birth year] Patient phone:  [phone] (home)  Patient address:   [address] [place] [address],  Total Time spent with patient: 30 minutes  Date of Admission:  12/16/2015 Date of Discharge: 12/20/15  Reason for Admission:  SI  Principal Problem: Substance induced mood disorder [facility]) Discharge Diagnoses: Patient Active Problem List   Diagnosis Date Noted  . h/o Attention deficit hyperactivity disorder (ADHD) [F90.9] 12/20/2015  . 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 Opioid use disorder, severe, dependence ([place]) [F11.20] 12/20/2015  . Alcohol use disorder, moderate, dependence ([place]) [F10.20] 12/20/2015  . Substance induced mood disorder ([place]) [F19.94] 12/20/2015  . Tobacco use disorder [F17.200] 12/16/2015  . Cannabis use disorder, severe, dependence ([place]) [F12.20]   . Asthma [J45.909] 06/11/2012    Class: Chronic   History of Present Illness:  Identifying data. Mr. [name] is a 25 year old male with a history of depression, anxiety, suicide attempts, and substance use.  Chief complaint. "My uncle died."  History of present illness. Information was obtained from the patient and the chart. The patient has a long history of depression and mood instability with multiple psychiatric hospitalizations. He returns to the hospital in the context of major loss. His uncle who was 25 years old died suddenly of heart attack. Apparently he was the only supports the patient has now. He developed symptoms of depression with poor sleep, decreased appetite, anhedonia, feeling of guilt and hopelessness worthlessness, poor energy and concentration, social isolation crying spells this culminated in suicidal thinking with a plan to smother himself with a pillow or hang himself. In addition to major loss at the patient has not been taking his medications for a few  days. According to some sources he did not have money according to another he did not get them. He denies symptoms of anxiety or psychosis. There are no symptoms suggestive of bipolar mania. He reports using alcohol, cannabis, cocaine but mostly heroine. Of note he was negative for substances except for cannabis.   Past psychiatric history. Multiple psychiatric hospitalizations for depression and substance use. He reports multiple suicide attempts by overdose hanging Taking jumping off the bridge. It is unclear how he survived all the attempts.   Family psychiatric history. Mother and brother with depression. They both attempted suicides.  Social history. He is divorced. He reports that he believes in extended stay hotel. He cannot explain how he pays his bills.  Total Time spent with patient: 1 hour  Past Psychiatric History: Depression and substance use.    Past Medical History:  Past Medical History  Diagnosis Date  . Asthma   . Bipolar depression ([place])   . Schizophrenia ([place])   . Suicide attempt ([place])   . Homelessness   . H/O suicide attempt     attempted hanging, gun to head, cut self    Past Surgical History  Procedure Laterality Date  . No past surgeries     Family History:  Family History  Problem Relation Age of Onset  . Drug abuse Mother   . Mental illness Mother     Social History:  History  Alcohol Use  . Yes    Comment: last use 1 week.      History  Drug Use  . Yes  . Special: Marijuana, Oxycodone, "Crack" cocaine, Heroin, IV, Cocaine    Comment: heroin -2 days ago cocaine-2 days  Social History   Social History  . Marital Status: Divorced    Spouse Name: N/A  . Number of Children: N/A  . Years of Education: N/A   Social History Main Topics  . Smoking status: Current Every Day Smoker -- 1.50 packs/day for 13 years    Types: Cigarettes  . Smokeless tobacco: None  . Alcohol Use: Yes     Comment: last use 1 week.   . Drug Use: Yes     Special: Marijuana, Oxycodone, "Crack" cocaine, Heroin, IV, Cocaine     Comment: heroin -2 days ago cocaine-2 days   . Sexual Activity: No   Other Topics Concern  . None   Social History Narrative    Hospital Course:    Mr. [name] is a 25 year old male with a history of depression, mood instability, and substance abuse admitting for worsening of depression in the context of major loss and medication discontinuation.  Says he wants inpatient substance abuse treatment. Pt has been using drugs since age 57. No periods of sobriety since then. Has been abusing heroin, crack and alcohol. Says he does not want to keep living and thinks about dying as a way to stay with his uncle. Pt says he was raised by his uncle. His uncle died recently due to "alcohol coma". Remer Macho took place on Thursday of last week. Both of his parents died when he was teenager. Does not have any other family.  Prior to admission he was living in a [place] and was working odd jobs.  Says he was diagnosed with bipolar when he was 45. He has been prescribed with seroquel and depakote. Many admission to [facility] in [place].  Substance induced mood disorder: no medications appear to be indicated at this time.  H/o ADHD: pt is requesting treatment for ADHD.  Has tried Strattera in the past, agrees with trying this medication again.  Opioid withdrawal: will order flexeril, ibuprofen and imodium  Alcohol, cocaine and opioid addiction: will try to make referral for substance abuse residential. [facility]  Asthma. He is on inhaler.  Smoking. Nicotine patches available.  Discharge: in the next 24-48 h  Dispo[facility]  The day of the discharge the patient was alert and oriented, mood was euthymic and his affect was bright and reactive. He denied feelings of hopelessness helplessness or worthlessness. He was motivated for treatment. Appears  future oriented.  Patient denies major problems with sleep, appetite, energy is sleep or concentration. Denies suicidality, homicidality or having auditory or visual hallucinations. He denies side effects from medications. He denies having any physical complaints.  During his stay in the hospital the patient did not display any disruptive or unsafe behaviors. He participated in programming. He had appropriate interactions with peers. There were no issues during his stay. The patient did not require seclusion, restraints or forced medications.  Physical Findings: AIMS:  , ,  ,  ,    CIWA:    COWS:     Musculoskeletal: Strength & Muscle Tone: within normal limits Gait & Station: normal Patient leans: N/A  Psychiatric Specialty Exam: Review of Systems  Constitutional: Negative.   HENT: Negative.   Eyes: Negative.   Respiratory: Negative.   Cardiovascular: Negative.   Gastrointestinal: Negative.   Genitourinary: Negative.   Musculoskeletal: Negative.   Skin: Negative.   Neurological: Negative.   Endo/Heme/Allergies: Negative.   Psychiatric/Behavioral: Negative.     Blood pressure 100/51, pulse  102, temperature 98.2 F (36.8 C), temperature source Oral, resp. rate 18, height 5' 6"  (1.676 m), weight 78.926 kg (174 lb), SpO2 100 %.Body mass index is 28.1 kg/(m^2).  General Appearance: Well Groomed  Engineer, water::  Good  Speech:  Clear and Coherent  Volume:  Normal  Mood:  Euthymic  Affect:  Appropriate  Thought Process:  Linear  Orientation:  Full (Time, Place, and Person)  Thought Content:  Hallucinations: None  Suicidal Thoughts:  No  Homicidal Thoughts:  No  Memory:  Immediate;   Good Recent;   Good Remote;   Good  Judgement:  Fair  Insight:  Fair  Psychomotor Activity:  Normal  Concentration:  Good  Recall:  Good  Fund of Knowledge:Good  Language: Good  Akathisia:  No  Handed:    AIMS (if indicated):     Assets:  Communication Skills Physical Health  ADL's:   Intact  Cognition: WNL  Sleep:  Number of Hours: 9   Have you used any form of tobacco in the last 30 days? (Cigarettes, Smokeless Tobacco, Cigars, and/or Pipes): Yes  Has this patient used any form of tobacco in the last 30 days? (Cigarettes, Smokeless Tobacco, Cigars, and/or Pipes) Yes, Yes, A prescription for an FDA-approved tobacco cessation medication was offered at discharge and the patient refused  Blood Alcohol level:  Lab Results  Component Value Date   [facility] <5 12/14/2015   ETH <5 16/07/9603    Metabolic Disorder Labs:  Lab Results  Component Value Date   HGBA1C 5.0 12/17/2015   MPG 111 09/09/2014   Lab Results  Component Value Date   PROLACTIN 14.6 12/17/2015   Lab Results  Component Value Date   CHOL 171 12/17/2015   TRIG 285* 12/17/2015   HDL 40* 12/17/2015   CHOLHDL 4.3 12/17/2015   VLDL 57* 12/17/2015   LDLCALC 74 12/17/2015   LDLCALC 130* 09/09/2014   Results for GAYLORD, SEYDEL (MRN [id number]) as of 12/20/2015 09:19  Ref. Range 12/14/2015 18:38 12/14/2015 18:51 12/17/2015 14:36  Sodium Latest Ref Range: 135-145 mmol/L 140    Potassium Latest Ref Range: 3.5-5.1 mmol/L 4.1    Chloride Latest Ref Range: 101-111 mmol/L 106    CO2 Latest Ref Range: 22-32 mmol/L 25    BUN Latest Ref Range: 6-20 mg/dL 14    Creatinine Latest Ref Range: 0.61-1.24 mg/dL 0.74    Calcium Latest Ref Range: 8.9-10.3 mg/dL 9.1    EGFR (Non-African Amer.) Latest Ref Range: >60 mL/min >60    EGFR (African American) Latest Ref Range: >60 mL/min >60    Glucose Latest Ref Range: 65-99 mg/dL 99    Anion gap Latest Ref Range: 5-15  9    Alkaline Phosphatase Latest Ref Range: 38-126 U/L 72    Albumin Latest Ref Range: 3.5-5.0 g/dL 3.8    AST Latest Ref Range: 15-41 U/L 24    ALT Latest Ref Range: 17-63 U/L 24    Total Protein Latest Ref Range: 6.5-8.1 g/dL 7.0    Total Bilirubin Latest Ref Range: 0.3-1.2 mg/dL 0.2 (L)    Cholesterol Latest Ref Range: 0-200 mg/dL   171  Triglycerides Latest  Ref Range: <150 mg/dL   285 (H)  HDL Cholesterol Latest Ref Range: >40 mg/dL   40 (L)  LDL (calc) Latest Ref Range: 0-99 mg/dL   74  VLDL Latest Ref Range: 0-40 mg/dL   57 (H)  Total CHOL/HDL Ratio Latest Units: RATIO   4.3  WBC Latest Ref Range: 4.0-10.5 K/uL 8.7  RBC Latest Ref Range: 4.22-5.81 MIL/uL 4.35    Hemoglobin Latest Ref Range: 13.0-17.0 g/dL 12.9 (L)    HCT Latest Ref Range: 39.0-52.0 % 39.3    MCV Latest Ref Range: 78.0-100.0 fL 90.3    MCH Latest Ref Range: 26.0-34.0 pg 29.7    MCHC Latest Ref Range: 30.0-36.0 g/dL 32.8    RDW Latest Ref Range: 11.5-15.5 % 13.4    Platelets Latest Ref Range: 150-400 K/uL 191    Acetaminophen (Tylenol), S Latest Ref Range: 10-30 ug/mL <53 (L)    Salicylate Lvl Latest Ref Range: 2.8-30.0 mg/dL <4.0    Prolactin Latest Ref Range: 4.0-15.2 ng/mL   14.6  Hemoglobin A1C Latest Ref Range: 4.0-6.0 %   5.0  TSH Latest Ref Range: 0.350-4.500 uIU/mL   0.840  Alcohol, Ethyl (B) Latest Ref Range: <5 mg/dL <5    Amphetamines Latest Ref Range: NONE DETECTED   NONE DETECTED   Barbiturates Latest Ref Range: NONE DETECTED   NONE DETECTED   Benzodiazepines Latest Ref Range: NONE DETECTED   NONE DETECTED   Opiates Latest Ref Range: NONE DETECTED   NONE DETECTED   COCAINE Latest Ref Range: NONE DETECTED   NONE DETECTED   Tetrahydrocannabinol Latest Ref Range: NONE DETECTED   POSITIVE (A)    See Psychiatric Specialty Exam and Suicide Risk Assessment completed by Attending Physician prior to discharge.  Discharge destination:  Other:  residential substance abuse program  Is patient on multiple antipsychotic therapies at discharge:  No   Has Patient had three or more failed trials of antipsychotic monotherapy by history:  No  Recommended Plan for Multiple Antipsychotic Therapies: NA     Medication List    STOP taking these medications        divalproex 500 MG 24 hr tablet  Commonly known as:  DEPAKOTE ER     gabapentin 300 MG capsule  Commonly  known as:  NEURONTIN     nicotine 21 mg/24hr patch  Commonly known as:  NICODERM CQ - dosed in mg/24 hours     QUEtiapine 400 MG tablet  Commonly known as:  SEROQUEL     QUEtiapine 50 MG tablet  Commonly known as:  SEROQUEL      TAKE these medications      Indication   albuterol 108 (90 Base) MCG/ACT inhaler  Commonly known as:  PROVENTIL HFA;VENTOLIN HFA  Inhale 2 puffs into the lungs every 4 (four) hours as needed for shortness of breath.  Notes to Patient:  asthma      atomoxetine 60 MG capsule  Commonly known as:  STRATTERA  Take 1 capsule (60 mg total) by mouth daily.  Notes to Patient:  H/o ADHD      traZODone 150 MG tablet  Commonly known as:  DESYREL  Take 1 tablet (150 mg total) by mouth at bedtime.  Notes to Patient:  insomnia        Follow-up Information    Go to [employer].   Why:  Walk-ins Monday-Friday between 9am-4pm. Arrive Early as this minimizes your wait time.  , Hospital Follow up, Outpatient Medication Management, Therapy, Please take your photo ID and Insurance card   Contact information:   [place] [address] [phone]       Signed: Hildred Priest, MD 12/20/2015, 10:09 AM

## 2016-01-07 ENCOUNTER — Encounter: Payer: Self-pay | Admitting: Emergency Medicine

## 2016-01-07 ENCOUNTER — Emergency Department
Admission: EM | Admit: 2016-01-07 | Discharge: 2016-01-08 | Disposition: A | Discharge status: Home / Self Care | Payer: Medicaid Other | Attending: Emergency Medicine | Admitting: Emergency Medicine

## 2016-01-07 DIAGNOSIS — J45909 Unspecified asthma, uncomplicated: Secondary | ICD-10-CM | POA: Diagnosis present

## 2016-01-07 DIAGNOSIS — F329 Major depressive disorder, single episode, unspecified: Secondary | ICD-10-CM | POA: Diagnosis not present

## 2016-01-07 DIAGNOSIS — F1220 Cannabis dependence, uncomplicated: Secondary | ICD-10-CM | POA: Diagnosis present

## 2016-01-07 DIAGNOSIS — F1994 Other psychoactive substance use, unspecified with psychoactive substance-induced mood disorder: Secondary | ICD-10-CM | POA: Diagnosis not present

## 2016-01-07 DIAGNOSIS — F17210 Nicotine dependence, cigarettes, uncomplicated: Secondary | ICD-10-CM | POA: Insufficient documentation

## 2016-01-07 DIAGNOSIS — R45851 Suicidal ideations: Secondary | ICD-10-CM | POA: Diagnosis present

## 2016-01-07 DIAGNOSIS — F209 Schizophrenia, unspecified: Secondary | ICD-10-CM | POA: Insufficient documentation

## 2016-01-07 DIAGNOSIS — Z79899 Other long term (current) drug therapy: Secondary | ICD-10-CM | POA: Insufficient documentation

## 2016-01-07 DIAGNOSIS — F17200 Nicotine dependence, unspecified, uncomplicated: Secondary | ICD-10-CM | POA: Diagnosis present

## 2016-01-07 HISTORY — DX: Major depressive disorder, single episode, unspecified: F32.9

## 2016-01-07 HISTORY — DX: Anxiety disorder, unspecified: F41.9

## 2016-01-07 HISTORY — DX: Depression, unspecified: F32.A

## 2016-01-07 LAB — URINE DRUG SCREEN, QUALITATIVE (ARMC ONLY)
Amphetamines, Ur Screen: NOT DETECTED
BARBITURATES, UR SCREEN: NOT DETECTED
BENZODIAZEPINE, UR SCRN: NOT DETECTED
CANNABINOID 50 NG, UR ~~LOC~~: NOT DETECTED
COCAINE METABOLITE, UR ~~LOC~~: NOT DETECTED
MDMA (Ecstasy)Ur Screen: NOT DETECTED
METHADONE SCREEN, URINE: NOT DETECTED
OPIATE, UR SCREEN: NOT DETECTED
Phencyclidine (PCP) Ur S: NOT DETECTED
TRICYCLIC, UR SCREEN: NOT DETECTED

## 2016-01-07 LAB — CBC
HCT: 40.1 % (ref 40.0–52.0)
HEMOGLOBIN: 13.4 g/dL (ref 13.0–18.0)
MCH: 29.5 pg (ref 26.0–34.0)
MCHC: 33.3 g/dL (ref 32.0–36.0)
MCV: 88.6 fL (ref 80.0–100.0)
Platelets: 211 10*3/uL (ref 150–440)
RBC: 4.53 MIL/uL (ref 4.40–5.90)
RDW: 13.7 % (ref 11.5–14.5)
WBC: 8.8 10*3/uL (ref 3.8–10.6)

## 2016-01-07 LAB — COMPREHENSIVE METABOLIC PANEL
ALBUMIN: 4.8 g/dL (ref 3.5–5.0)
ALK PHOS: 74 U/L (ref 38–126)
ALT: 18 U/L (ref 17–63)
ANION GAP: 9 (ref 5–15)
AST: 19 U/L (ref 15–41)
BILIRUBIN TOTAL: 0.9 mg/dL (ref 0.3–1.2)
BUN: 18 mg/dL (ref 6–20)
CALCIUM: 9.5 mg/dL (ref 8.9–10.3)
CO2: 25 mmol/L (ref 22–32)
Chloride: 103 mmol/L (ref 101–111)
Creatinine, Ser: 0.96 mg/dL (ref 0.61–1.24)
GLUCOSE: 87 mg/dL (ref 65–99)
Potassium: 3.8 mmol/L (ref 3.5–5.1)
Sodium: 137 mmol/L (ref 135–145)
TOTAL PROTEIN: 7.9 g/dL (ref 6.5–8.1)

## 2016-01-07 LAB — SALICYLATE LEVEL

## 2016-01-07 LAB — ACETAMINOPHEN LEVEL

## 2016-01-07 LAB — ETHANOL: Alcohol, Ethyl (B): <5 mg/dL (ref <5)

## 2016-01-07 MED ORDER — QUETIAPINE FUMARATE 200 MG PO TABS
400.0000 mg | ORAL_TABLET | ORAL | Status: AC
  Administered 2016-01-07: 400 mg via ORAL
  Filled 2016-01-07: qty 2

## 2016-01-07 MED ORDER — TRAZODONE HCL 50 MG PO TABS
150.0000 mg | ORAL_TABLET | Freq: Every day | ORAL | Status: DC
  Administered 2016-01-07: 150 mg via ORAL
  Filled 2016-01-07: qty 1

## 2016-01-07 NOTE — ED Notes (Signed)
Patient presents to the ED with suicidal ideation.  Patient reports having a plan to cut himself with a box cutter and patient had a box cutter with him when police arrived at his residence.  Patient reports getting kicked out of his home and patient states he was feeling depressed and frustrated because he couldn't get in at the shelter.  Patient states, "no one would help me so I called 911."  Patient reports history of depression and suicide attempts including attempting to hang himself and walk in front of a car.  Patient takes multiple anti-depressents.  Denies hearing voices.

## 2016-01-07 NOTE — BH Assessment (Signed)
Assessment Note  [name] is an 25 y.o. male presenting to the ED with suicidal ideations with a plan to cut his wrists with a box cutter.  Pt has history of bipolar disorder, schizophrenia andprevious suicide attempts. He states that he just got a new job and was not able to make his rental payment because he doesn't get paid until Thursday, therefore he was kicked out. He states he became suicidal and that he had thoughts of using his box cutter to cut his wrists.  Pt reports he receives medication management from [facility] but ran out of his meds 2 days ago.  He says that he had housing through [facility] but got kicked out because the landlord would not give him time to pay his rent.  He reports he gets paid on Thursday.  Patients that he is trying to find stable housing because he just found his wife is pregnant and he wants to be able to provide a place for them to live as a family.  Diagnosis: Bipolar Depression; Hx of suicide attempts  Past Medical History:  Past Medical History  Diagnosis Date  . Asthma   . Bipolar depression ([facility])   . Schizophrenia ([facility])   . Suicide attempt ([facility])   . Homelessness   . H/O suicide attempt     attempted hanging, gun to head, cut self  . Depression   . Anxiety     Past Surgical History  Procedure Laterality Date  . No past surgeries      Family History:  Family History  Problem Relation Age of Onset  . Drug abuse Mother   . Mental illness Mother     Social History:  reports that he has been smoking Cigarettes.  He has a 19.5 pack-year smoking history. He does not have any smokeless tobacco history on file. He reports that he drinks alcohol. He reports that he uses illicit drugs (Marijuana, Oxycodone, "Crack" cocaine, Heroin, IV, and Cocaine).  Additional Social History:  Alcohol / Drug Use History of alcohol / drug use?: Yes Substance #1 Name of Substance 1: ETOH 1 - Age of First Use: 15 1 - Amount (size/oz): 40 oz  beers 1 - Frequency: varies 1 - Duration: ongoing 1 - Last Use / Amount: month ago Substance #2 Name of Substance 2: Heroin 2 - Age of First Use: 15 2 - Amount (size/oz): varies 2 - Frequency: daily 2 - Duration: ongoing 2 - Last Use / Amount: month ago Substance #3 Name of Substance 3: Cannabis 3 - Age of First Use: 15 3 - Amount (size/oz): oz 3 - Frequency: daily 3 - Duration: on going 3 - Last Use / Amount: month ago  CIWA: CIWA-Ar BP: (!) 113/57 mmHg Pulse Rate: 64 COWS:    Allergies: No Known Allergies  Home Medications:  (Not in a hospital admission)  OB/GYN Status:  No LMP for male patient.  General Assessment Data Location of Assessment: [facility] ED TTS Assessment: In system Is this a Tele or Face-to-Face Assessment?: Face-to-Face Is this an Initial Assessment or a Re-assessment for this encounter?: Initial Assessment Marital status: Separated Maiden name: N/A Is patient pregnant?: No Pregnancy Status: No Living Arrangements: Alone Can pt return to current living arrangement?: Yes Admission Status: Voluntary Is patient capable of signing voluntary admission?: Yes Referral Source: Self/Family/Friend Insurance type: Medicaid     Crisis Care Plan Living Arrangements: Alone Legal Guardian: Other: (self) Name of Psychiatrist: [facility] Name of Therapist: [facility]  Education Status  Is patient currently in school?: No Current Grade: N/A Highest grade of school patient has completed: 12 Name of school: na Contact person: na  Risk to self with the past 6 months Suicidal Ideation: Yes-Currently Present Has patient been a risk to self within the past 6 months prior to admission? : Yes Suicidal Intent: Yes-Currently Present Has patient had any suicidal intent within the past 6 months prior to admission? : Yes Is patient at risk for suicide?: Yes Suicidal Plan?: Yes-Currently Present Has patient had any suicidal plan within the past 6 months prior to  admission? : Yes Specify Current Suicidal Plan: Pt reports plan to cut wrists with box cutter Access to Means: Yes Specify Access to Suicidal Means: Pt has acess to box cutter What has been your use of drugs/alcohol within the last 12 months?: ETOH, THC, heroin Previous Attempts/Gestures: Yes How many times?: 5 Other Self Harm Risks: polysubstance abuse Triggers for Past Attempts: Unknown Intentional Self Injurious Behavior: None Family Suicide History: No Recent stressful life event(s): Financial Problems, Loss (Comment) (Homeless) Persecutory voices/beliefs?: No Depression: Yes Depression Symptoms: Loss of interest in usual pleasures, Feeling worthless/self pity, Despondent Substance abuse history and/or treatment for substance abuse?: Yes Suicide prevention information given to non-admitted patients: Not applicable  Risk to Others within the past 6 months Homicidal Ideation: No Does patient have any lifetime risk of violence toward others beyond the six months prior to admission? : No Thoughts of Harm to Others: No Current Homicidal Intent: No Current Homicidal Plan: No Access to Homicidal Means: No Identified Victim: N/A History of harm to others?: No Assessment of Violence: None Noted Violent Behavior Description: None reported Does patient have access to weapons?: No Criminal Charges Pending?: No Does patient have a court date: No Is patient on probation?: Unknown  Psychosis Hallucinations: None noted Delusions: None noted  Mental Status Report Appearance/Hygiene: In scrubs, Unremarkable Eye Contact: Good Motor Activity: Freedom of movement Speech: Logical/coherent Level of Consciousness: Alert Mood: Pleasant, Anxious Affect: Anxious Anxiety Level: Minimal Thought Processes: Coherent, Relevant Judgement: Partial Orientation: Person, Place, Time, Situation Obsessive Compulsive Thoughts/Behaviors: None  Cognitive Functioning Concentration: Normal Memory:  Recent Intact, Remote Intact IQ: Average Insight: Fair Impulse Control: Fair Appetite: Good Weight Loss: 0 Weight Gain: 0 Sleep: Decreased Total Hours of Sleep: 6 Vegetative Symptoms: None  ADLScreening [facility] Assessment Services) Patient's cognitive ability adequate to safely complete daily activities?: Yes Patient able to express need for assistance with ADLs?: Yes Independently performs ADLs?: Yes (appropriate for developmental age)  Prior Inpatient Therapy Prior Inpatient Therapy: Yes Prior Therapy Dates: Multiple [facility] Reason for Treatment: SI  Prior Outpatient Therapy Prior Outpatient Therapy: Yes Prior Therapy Dates: Current Prior Therapy Facilty/Provider(s): [facility] Reason for Treatment: med management Does patient have an ACCT team?: No Does patient have Intensive In-House Services?  : No Does patient have [facility] services? : Yes Does patient have P4CC services?: No  ADL Screening (condition at time of admission) Patient's cognitive ability adequate to safely complete daily activities?: Yes Patient able to express need for assistance with ADLs?: Yes Independently performs ADLs?: Yes (appropriate for developmental age)       Abuse/Neglect Assessment (Assessment to be complete while patient is alone) Physical Abuse: Denies Verbal Abuse: Denies Sexual Abuse: Denies Exploitation of patient/patient's resources: Denies Self-Neglect: Denies Values / Beliefs Cultural Requests During Hospitalization: None Spiritual Requests During Hospitalization: None Consults Spiritual Care Consult Needed: No Social Work Consult Needed: No Merchant navy officerAdvance Directives (For Healthcare) Does patient  have an advance directive?: No Would patient like information on creating an advanced directive?: No - patient declined information    Additional Information 1:1 In Past 12 Months?: No CIRT Risk: No Elopement Risk: No Does patient have medical  clearance?: Yes     Disposition:  Disposition Initial Assessment Completed for this Encounter: Yes Disposition of Patient: Other dispositions Other disposition(s): Other (Comment) (Psych MD consult)  On Site Evaluation by:   Reviewed with Physician:    [place] 01/07/2016 10:28 PM

## 2016-01-07 NOTE — ED Provider Notes (Signed)
[facility] Emergency Department Provider Note  ____________________________________________  Time seen: Approximately 7:39 PM  I have reviewed the triage vital signs and the nursing notes.   HISTORY  Chief Complaint Suicidal    HPI [name] is a 25 y.o. male history of bipolar disorder and schizophrenia, previous suicide attempts.  Patient reports that he just got a new job and was not able to make his rental payment, therefore he was kicked out. Reports he became suicidal and that at this time he still thinks that if he was out on the street he would stab himself and try to kill himself. Denies homicidal ideation. Denies any made any attempt or overdose, but reports that he feels like he is to be "stabilized". He ran out of his medicine about 2 days ago.  Denies pain or recent illness. He was recently hospitalized reports he was better, has a full-time job now but has not yet got his first paycheck.   Past Medical History  Diagnosis Date  . Asthma   . Bipolar depression (HCC)   . Schizophrenia (HCC)   . Suicide attempt (HCC)   . Homelessness   . H/O suicide attempt     attempted hanging, gun to head, cut self  . Depression   . Anxiety     Patient Active Problem List   Diagnosis Date Noted  . h/o Attention deficit hyperactivity disorder (ADHD) 12/20/2015  . Stimulant use disorder (HCC) (cocaine) 12/20/2015  . Opioid use disorder, severe, dependence (HCC) 12/20/2015  . Alcohol use disorder, moderate, dependence (HCC) 12/20/2015  . Substance induced mood disorder (HCC) 12/20/2015  . Tobacco use disorder 12/16/2015  . Cannabis use disorder, severe, dependence (HCC)   . Asthma 06/11/2012    Class: Chronic    Past Surgical History  Procedure Laterality Date  . No past surgeries      Current Outpatient Rx  Name  Route  Sig  Dispense  Refill  . albuterol (PROVENTIL HFA;VENTOLIN HFA) 108 (90 Base) MCG/ACT inhaler   Inhalation   Inhale 2  puffs into the lungs every 4 (four) hours as needed for shortness of breath.   1 Inhaler   0   . atomoxetine (STRATTERA) 60 MG capsule   Oral   Take 1 capsule (60 mg total) by mouth daily.   30 capsule   0   . traZODone (DESYREL) 150 MG tablet   Oral   Take 1 tablet (150 mg total) by mouth at bedtime.   30 tablet   0     Allergies Review of patient's allergies indicates no known allergies.  Family History  Problem Relation Age of Onset  . Drug abuse Mother   . Mental illness Mother     Social History Social History  Substance Use Topics  . Smoking status: Current Every Day Smoker -- 1.50 packs/day for 13 years    Types: Cigarettes  . Smokeless tobacco: None  . Alcohol Use: Yes     Comment: last use 1 week.     Review of Systems Constitutional: No fever/chills Eyes: No visual changes. ENT: No sore throat. Cardiovascular: Denies chest pain. Respiratory: Denies shortness of breath. Gastrointestinal: No abdominal pain.  No nausea, no vomiting.  No diarrhea.  No constipation. Genitourinary: Negative for dysuria. Musculoskeletal: Negative for back pain. Skin: Negative for rash. Neurological: Negative for headaches, focal weakness or numbness.  10-point ROS otherwise negative.  ____________________________________________   PHYSICAL EXAM:  VITAL SIGNS: ED Triage Vitals  Enc Vitals  Group     BP 01/07/16 1900 140/66 mmHg     Pulse Rate 01/07/16 1900 85     Resp 01/07/16 1900 20     Temp 01/07/16 1900 99 F (37.2 C)     Temp Source 01/07/16 1900 Oral     SpO2 01/07/16 1900 98 %     Weight 01/07/16 1900 177 lb (80.287 kg)     Height 01/07/16 1900  (1.702 m)     Head Cir --      Peak Flow --      Pain Score 01/07/16 1900 10     Pain Loc --      Pain Edu? --      Excl. in GC? --    Constitutional: Alert and oriented. Well appearing and in no acute distress. Eyes: Conjunctivae are normal. PERRL. EOMI. Head: Atraumatic. Nose: No  congestion/rhinnorhea. Mouth/Throat: Mucous membranes are moist.  Oropharynx non-erythematous. Neck: No stridor.   Cardiovascular: Normal rate, regular rhythm. Grossly normal heart sounds.  Good peripheral circulation. Respiratory: Normal respiratory effort.  No retractions. Lungs CTAB. Gastrointestinal: Soft and nontender. No distention. Musculoskeletal: No lower extremity tenderness nor edema.   Neurologic:  Normal speech and language. No gross focal neurologic deficits are appreciated. No gait instability. Skin:  Skin is warm, dry and intact. No rash noted. Psychiatric: Mood and affect are slightly elevated. Speech and behavior are normal.  ____________________________________________   LABS (all labs ordered are listed, but only abnormal results are displayed)  Labs Reviewed  ACETAMINOPHEN LEVEL - Abnormal; Notable for the following:    Acetaminophen (Tylenol), Serum <10 (*)    All other components within normal limits  COMPREHENSIVE METABOLIC PANEL  ETHANOL  SALICYLATE LEVEL  CBC  URINE DRUG SCREEN, QUALITATIVE ([facility] ONLY)   ____________________________________________  EKG   ____________________________________________  RADIOLOGY   ____________________________________________   PROCEDURES  Procedure(s) performed: None  Critical Care performed: No  ____________________________________________   INITIAL IMPRESSION / ASSESSMENT AND PLAN / ED COURSE  Pertinent labs & imaging results that were available during my care of the patient were reviewed by me and considered in my medical decision making (see chart for details).  Patient presents for suicidal ideation. Medically cleared, no evidence of acute emergent medical process noted. Seen by psychiatry for involuntary commitment advises to maintain him on involuntary commitment until further history can be clarified and reevaluation with psychiatry.  Have ordered a consult by TTS and a repeat psych consult for  tomorrow.  Patient no acute distress. Amicable. ____________________________________________   FINAL CLINICAL IMPRESSION(S) / ED DIAGNOSES  Final diagnoses:  Schizophrenia, unspecified type (HCC)      Sharyn Creamer, MD 01/07/16 2216

## 2016-01-08 ENCOUNTER — Encounter: Payer: Self-pay | Admitting: Psychiatry

## 2016-01-08 DIAGNOSIS — F1994 Other psychoactive substance use, unspecified with psychoactive substance-induced mood disorder: Secondary | ICD-10-CM

## 2016-01-08 MED ORDER — ALBUTEROL SULFATE HFA 108 (90 BASE) MCG/ACT IN AERS: 2.0000 | INHALATION_SPRAY | Freq: Four times a day (QID) | RESPIRATORY_TRACT | Status: DC | PRN

## 2016-01-08 MED ORDER — ATOMOXETINE HCL 60 MG PO CAPS: 60.0000 mg | ORAL_CAPSULE | Freq: Every day | ORAL | Status: DC

## 2016-01-08 MED ORDER — ALBUTEROL SULFATE HFA 108 (90 BASE) MCG/ACT IN AERS
2.0000 | INHALATION_SPRAY | Freq: Four times a day (QID) | RESPIRATORY_TRACT | Status: DC | PRN
  Filled 2016-01-08: qty 6.7

## 2016-01-08 MED ORDER — ATOMOXETINE HCL 60 MG PO CAPS
60.0000 mg | ORAL_CAPSULE | Freq: Every day | ORAL | Status: DC
  Filled 2016-01-08: qty 1

## 2016-01-08 MED ORDER — GABAPENTIN 800 MG PO TABS
800.0000 mg | ORAL_TABLET | Freq: Three times a day (TID) | ORAL | Status: DC
  Filled 2016-01-08 (×3): qty 1

## 2016-01-08 MED ORDER — TRAZODONE HCL 150 MG PO TABS: 150.0000 mg | ORAL_TABLET | Freq: Every day | ORAL | Status: DC

## 2016-01-08 MED ORDER — GABAPENTIN 400 MG PO CAPS
800.0000 mg | ORAL_CAPSULE | Freq: Three times a day (TID) | ORAL | Status: DC
  Administered 2016-01-08: 800 mg via ORAL
  Filled 2016-01-08 (×4): qty 2

## 2016-01-08 NOTE — ED Notes (Signed)
ED BHU PLACEMENT JUSTIFICATION Is the patient under IVC or is there intent for IVC: No. Is the patient medically cleared: Yes.   Is there vacancy in the ED BHU: Yes.   Is the population mix appropriate for patient: Yes.   Is the patient awaiting placement in inpatient or outpatient setting: No. Has the patient had a psychiatric consult: Yes.   Survey of unit performed for contraband, proper placement and condition of furniture, tampering with fixtures in bathroom, shower, and each patient room: Yes.  ; Findings:  APPEARANCE/BEHAVIOR calm and cooperative NEURO ASSESSMENT Orientation: time, place and person Hallucinations: No.None noted (Hallucinations) Speech: Normal Gait: normal RESPIRATORY ASSESSMENT Normal expansion.  Clear to auscultation.  No rales, rhonchi, or wheezing. CARDIOVASCULAR ASSESSMENT regular rate and rhythm, S1, S2 normal, no murmur, click, rub or gallop GASTROINTESTINAL ASSESSMENT soft, nontender, BS WNL, no r/g EXTREMITIES normal strength, tone, and muscle mass PLAN OF CARE Provide calm/safe environment. Vital signs assessed twice daily. ED BHU Assessment once each 12-hour shift. Collaborate with intake RN daily or as condition indicates. Assure the ED provider has rounded once each shift. Provide and encourage hygiene. Provide redirection as needed. Assess for escalating behavior; address immediately and inform ED provider.  Assess family dynamic and appropriateness for visitation as needed: ; If necessary, describe findings: Educate the patient/family about BHU procedures/visitation: No.; If necessary, describe findings: currently homeless

## 2016-01-08 NOTE — ED Notes (Signed)
Patient alert and oriented, calm and cooperative, denies si/hi at this time. Will continue to monitor.

## 2016-01-08 NOTE — ED Provider Notes (Signed)
-----------------------------------------   7:22 AM on 01/08/2016 -----------------------------------------   Blood pressure 113/57, pulse 64, temperature 98.4 F (36.9 C), temperature source Oral, resp. rate 18, height 5\' 7"  (1.702 m), weight 177 lb (80.287 kg), SpO2 97 %.  The patient had no acute events since last update.  Calm and cooperative at this time.  Disposition is pending per Psychiatry/Behavioral Medicine team recommendations.     Jeanmarie PlantJames A Catlin Doria, MD 01/08/16 [phone]

## 2016-01-08 NOTE — Discharge Instructions (Signed)
Schizophrenia °Schizophrenia is a mental illness. It may cause disturbed or disorganized thinking, speech, or behavior. People with schizophrenia have problems functioning in one or more areas of life: work, school, home, or relationships. People with schizophrenia are at increased risk for suicide, certain chronic physical illnesses, and unhealthy behaviors, such as smoking and drug use. °People who have family members with schizophrenia are at higher risk of developing the illness. Schizophrenia affects men and women equally but usually appears at an earlier age (teenage or early adult years) in men.  °SYMPTOMS °The earliest symptoms are often subtle (prodrome) and may go unnoticed until the illness becomes more severe (first-break psychosis). Symptoms of schizophrenia may be continuous or may come and go in severity. Episodes often are triggered by major life events, such as family stress, college, military service, marriage, pregnancy or child birth, divorce, or loss of a loved one. People with schizophrenia may see, hear, or feel things that do not exist (hallucinations). They may have false beliefs in spite of obvious proof to the contrary (delusions). Sometimes speech is incoherent or behavior is odd or withdrawn.  °DIAGNOSIS °Schizophrenia is diagnosed through an assessment by your caregiver. Your caregiver will ask questions about your thoughts, behavior, mood, and ability to function in daily life. Your caregiver may ask questions about your medical history and use of alcohol or drugs, including prescription medication. Your caregiver may also order blood tests and imaging exams. Certain medical conditions and substances can cause symptoms that resemble schizophrenia. Your caregiver may refer you to a mental health specialist for evaluation. There are three major criterion for a diagnosis of schizophrenia: °· Two or more of the following five symptoms are present for a month or longer: °¨ Delusions. Often  the delusions are that you are being attacked, harassed, cheated, persecuted or conspired against (persecutory delusions). °¨ Hallucinations.   °¨ Disorganized speech that does not make sense to others. °¨ Grossly disorganized (confused or unfocused) behavior or extremely overactive or underactive motor activity (catatonia). °¨ Negative symptoms such as bland or blunted emotions (flat affect), loss of will power (avolition), and withdrawal from social contacts (social isolation). °· Level of functioning in one or more major areas of life (work, school, relationships, or self-care) is markedly below the level of functioning before the onset of illness.   °· There are continuous signs of illness (either mild symptoms or decreased level of functioning) for at least 6 months or longer. °TREATMENT  °Schizophrenia is a long-term illness. It is best controlled with continuous treatment rather than treatment only when symptoms occur. The following treatments are used to manage schizophrenia: °· Medication--Medication is the most effective and important form of treatment for schizophrenia. Antipsychotic medications are usually prescribed to help manage schizophrenia. Other types of medication may be added to relieve any symptoms that may occur despite the use of antipsychotic medications. °· Counseling or talk therapy--Individual, group, or family counseling may be helpful in providing education, support, and guidance. Many people with schizophrenia also benefit from social skills and job skills (vocational) training. °A combination of medication and counseling is best for managing the disorder over time. A procedure in which electricity is applied to the brain through the scalp (electroconvulsive therapy) may be used to treat catatonic schizophrenia or schizophrenia in people who cannot take or do not respond to medication and counseling. °  °This information is not intended to replace advice given to you by your health  care provider. Make sure you discuss any questions you have with   your health care provider. °  °Document Released: 10/05/2000 Document Revised: 10/29/2014 Document Reviewed: 12/31/2012 °Elsevier Interactive Patient Education ©2016 [employer] ° °

## 2016-01-08 NOTE — ED Notes (Signed)
Patient with discharge instructions, verbalizes understanding, prescriptions given to patient, Patient without s/s of distress, denies Si/. Patient given belongings.

## 2016-01-08 NOTE — ED Notes (Signed)
Patient received lunch tray 

## 2016-01-08 NOTE — ED Notes (Signed)
Pt has requested we contact his place of employment on Monday around 8 AM to make them aware of hospitalization.  Contact information: [employer] [place], [place]; [phone].   Supervisors name: Farrell OursMonica Ochup.  Wife's contact information: Summer Perlow [phone].

## 2016-01-08 NOTE — ED Notes (Signed)
Report was received from [place], [place]; Pt. Verbalizes no complaints or distress; verbalizes having S.I.; denies having/Hi. And AVH; Continue to monitor with 15 min. Monitoring.

## 2016-01-08 NOTE — ED Notes (Signed)
Tele psych completed at this time

## 2016-01-08 NOTE — ED Notes (Signed)
Patient with discharge orders.

## 2016-01-08 NOTE — ED Notes (Signed)
Patient refused breakfast, Patient states ' I want to sleep" nurse will continue to monitor, q 15 min. Checks, and camera monitoring.

## 2016-01-08 NOTE — ED Provider Notes (Signed)
Patient cleared for discharge by psychiatry Dr. Starling MannsHernandez  Jaysha Lasure, MD 01/08/16 1322
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
## 2016-01-08 NOTE — Consult Note (Addendum)
[place] [facility] Consult   Reason for Consult:  SI Referring Physician:  ER Patient Identification: [name] MRN:  [id number] Principal Diagnosis: Substance induced mood disorder [facility]) Diagnosis:   Patient Active Problem List   Diagnosis Date Noted  . h/o Attention deficit hyperactivity disorder (ADHD) [F90.9] 12/20/2015  . 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 Opioid use disorder, severe, dependence ([place]) [F11.20] 12/20/2015  . Alcohol use disorder, moderate, dependence ([place]) [F10.20] 12/20/2015  . Substance induced mood disorder ([place]) [F19.94] 12/20/2015  . Tobacco use disorder [F17.200] 12/16/2015  . Cannabis use disorder, severe, dependence ([place]) [F12.20]   . Asthma [J45.909] 06/11/2012    Class: Chronic    Total Time spent with patient: 1 hour  Subjective:   [name] is a 25 y.o. male patient admitted with SI  HPI:   [name] is a 25 year old male with a history of depression, mood instability, and substance abuse who brought himself to the ER for SI and homelessness.  Patient was recently d/c from our inpatient unit.  He was admitted for SI, substance abuse and worsening mood in the setting of death of a family member (Pt says he was raised by his uncle). His uncle died recently due to "alcohol coma". He was d/c to a residential faith based substance abuse program.  He says he was "kick out" on Friday because he was caught smoking cigarettes on the porch. Pt went to local homeless shelter but he was declined. He has nowhere else to go. He wants to stay in [place] as he found a job at a [employer].  He is schedule to go to work tomorrow.  He does not get pay until Thursday. He denies to SW and this Probation officer been suicidal or homicidal.  "I feel good now that I'M on my meds".  Pt was clam, pleasant and cooperative during interview.  Utox neg.  Alcohol level below detection  Says he wants inpatient substance abuse treatment. Pt  has been using drugs since age abusing heroin, crack and alcohol.  Roney Jaffe he has been sober since d/c from our unit.   Past Psychiatric History: Says he was diagnosed with bipolar and ADHD when he was 21. Many admission to [facility] in [place]. From last hospitalization he received a diagnosis of substance induced mood disorder.  He was d/c on Stratterra Multiple psychiatric hospitalizations for depression and substance use. He reports multiple suicide attempts by overdose hanging Taking jumping off the bridge. It is unclear how he survived all the attempts.   Risk to Self: Suicidal Ideation: Yes-Currently Present Suicidal Intent: Yes-Currently Present Is patient at risk for suicide?: Yes Suicidal Plan?: Yes-Currently Present Specify Current Suicidal Plan: Pt reports plan to cut wrists with box cutter Access to Means: Yes Specify Access to Suicidal Means: Pt has acess to box cutter What has been your use of drugs/alcohol within the last 12 months?: ETOH, THC, heroin How many times?: 5 Other Self Harm Risks: polysubstance abuse Triggers for Past Attempts: Unknown Intentional Self Injurious Behavior: None Risk to Others: Homicidal Ideation: No Thoughts of Harm to Others: No Current Homicidal Intent: No Current Homicidal Plan: No Access to Homicidal Means: No Identified Victim: N/A History of harm to others?: No Assessment of Violence: None Noted Violent Behavior Description: None reported Does patient have access to weapons?: No Criminal Charges Pending?: No Does patient have a court date: No Prior Inpatient Therapy: Prior Inpatient Therapy: Yes Prior Therapy Dates: Multiple Prior Therapy Facilty/Provider(s): [facility] and  others Reason for Treatment: SI Prior Outpatient Therapy: Prior Outpatient Therapy: Yes Prior Therapy Dates: Current Prior Therapy Facilty/Provider(s): [facility] Reason for Treatment: med management Does patient have an ACCT team?: No Does patient have Intensive  In-House Services?  : No Does patient have [facility] services? : Yes Does patient have P4CC services?: No  Past Medical History:  Past Medical History  Diagnosis Date  . Suicide attempt ([place])   . Homelessness   . H/O suicide attempt     attempted hanging, gun to head, cut self  . Depression   . Anxiety     Past Surgical History  Procedure Laterality Date  . No past surgeries     Family History:  Family History  Problem Relation Age of Onset  . Drug abuse Mother   . Mental illness Mother    Family Psychiatric  History: Mother and brother with depression. They both attempted suicides.  Social History:  History  Alcohol Use  . Yes    Comment: last use 1 week.      History  Drug Use  . Yes  . Special: Marijuana, Oxycodone, "Crack" cocaine, Heroin, IV, Cocaine    Comment: heroin -2 days ago cocaine-2 days     Social History   Social History  . Marital Status: Divorced    Spouse Name: N/A  . Number of Children: N/A  . Years of Education: N/A   Social History Main Topics  . Smoking status: Current Every Day Smoker -- 1.50 packs/day for 13 years    Types: Cigarettes  . Smokeless tobacco: None  . Alcohol Use: Yes     Comment: last use 1 week.   . Drug Use: Yes    Special: Marijuana, Oxycodone, "Crack" cocaine, Heroin, IV, Cocaine     Comment: heroin -2 days ago cocaine-2 days   . Sexual Activity: No   Other Topics Concern  . None   Social History Narrative      Allergies:  No Known Allergies  Labs:  Results for orders placed or performed during the hospital encounter of 01/07/16 (from the past 48 hour(s))  Urine Drug Screen, Qualitative ([place] only)     Status: None   Collection Time: 01/07/16  6:57 PM  Result Value Ref Range   Tricyclic, Ur Screen NONE DETECTED NONE DETECTED   Amphetamines, Ur Screen NONE DETECTED NONE DETECTED   MDMA (Ecstasy)Ur Screen NONE DETECTED NONE DETECTED   Cocaine Metabolite,Ur [place] NONE DETECTED NONE DETECTED   Opiate, Ur  Screen NONE DETECTED NONE DETECTED   Phencyclidine (PCP) Ur S NONE DETECTED NONE DETECTED   Cannabinoid 50 Ng, Ur [place] NONE DETECTED NONE DETECTED   Barbiturates, Ur Screen NONE DETECTED NONE DETECTED   Benzodiazepine, Ur Scrn NONE DETECTED NONE DETECTED   Methadone Scn, Ur NONE DETECTED NONE DETECTED    Comment: (NOTE) 202  Tricyclics, urine               Cutoff 1000 ng/mL 200  Amphetamines, urine             Cutoff 1000 ng/mL 300  MDMA (Ecstasy), urine           Cutoff 500 ng/mL 400  Cocaine Metabolite, urine       Cutoff 300 ng/mL 500  Opiate, urine                   Cutoff 300 ng/mL 600  Phencyclidine (PCP), urine      Cutoff 25 ng/mL 700  Cannabinoid,  urine              Cutoff 50 ng/mL 800  Barbiturates, urine             Cutoff 200 ng/mL 900  Benzodiazepine, urine           Cutoff 200 ng/mL 1000 Methadone, urine                Cutoff 300 ng/mL 1100 1200 The urine drug screen provides only a preliminary, unconfirmed 1300 analytical test result and should not be used for non-medical 1400 purposes. Clinical consideration and professional judgment should 1500 be applied to any positive drug screen result due to possible 1600 interfering substances. A more specific alternate chemical method 1700 must be used in order to obtain a confirmed analytical result.  1800 Gas chromato graphy / mass spectrometry (GC/MS) is the preferred 1900 confirmatory method.   Comprehensive metabolic panel     Status: None   Collection Time: 01/07/16  7:04 PM  Result Value Ref Range   Sodium 137 135 - 145 mmol/L   Potassium 3.8 3.5 - 5.1 mmol/L   Chloride 103 101 - 111 mmol/L   CO2 25 22 - 32 mmol/L   Glucose, Bld 87 65 - 99 mg/dL   BUN 18 6 - 20 mg/dL   Creatinine, Ser 0.96 0.61 - 1.24 mg/dL   Calcium 9.5 8.9 - 10.3 mg/dL   Total Protein 7.9 6.5 - 8.1 g/dL   Albumin 4.8 3.5 - 5.0 g/dL   AST 19 15 - 41 U/L   ALT 18 17 - 63 U/L   Alkaline Phosphatase 74 38 - 126 U/L   Total Bilirubin 0.9 0.3 - 1.2  mg/dL   GFR calc non Af Amer >60 >60 mL/min   GFR calc Af Amer >60 >60 mL/min    Comment: (NOTE) The eGFR has been calculated using the CKD EPI equation. This calculation has not been validated in all clinical situations. eGFR's persistently <60 mL/min signify possible Chronic Kidney Disease.    Anion gap 9 5 - 15  Ethanol (ETOH)     Status: None   Collection Time: 01/07/16  7:04 PM  Result Value Ref Range   Alcohol, Ethyl (B) <5 <5 mg/dL    Comment:        LOWEST DETECTABLE LIMIT FOR SERUM ALCOHOL IS 5 mg/dL FOR MEDICAL PURPOSES ONLY   Salicylate level     Status: None   Collection Time: 01/07/16  7:04 PM  Result Value Ref Range   Salicylate Lvl <1.6 2.8 - 30.0 mg/dL  Acetaminophen level     Status: Abnormal   Collection Time: 01/07/16  7:04 PM  Result Value Ref Range   Acetaminophen (Tylenol), Serum <10 (L) 10 - 30 ug/mL    Comment:        THERAPEUTIC CONCENTRATIONS VARY SIGNIFICANTLY. A RANGE OF 10-30 ug/mL MAY BE AN EFFECTIVE CONCENTRATION FOR MANY PATIENTS. HOWEVER, SOME ARE BEST TREATED AT CONCENTRATIONS OUTSIDE THIS RANGE. ACETAMINOPHEN CONCENTRATIONS >150 ug/mL AT 4 HOURS AFTER INGESTION AND >50 ug/mL AT 12 HOURS AFTER INGESTION ARE OFTEN ASSOCIATED WITH TOXIC REACTIONS.   CBC     Status: None   Collection Time: 01/07/16  7:04 PM  Result Value Ref Range   WBC 8.8 3.8 - 10.6 K/uL   RBC 4.53 4.40 - 5.90 MIL/uL   Hemoglobin 13.4 13.0 - 18.0 g/dL   HCT 40.1 40.0 - 52.0 %   MCV 88.6 80.0 - 100.0 fL  MCH 29.5 26.0 - 34.0 pg   MCHC 33.3 32.0 - 36.0 g/dL   RDW 13.7 11.5 - 14.5 %   Platelets 211 150 - 440 K/uL    Current Facility-Administered Medications  Medication Dose Route Frequency Provider Last Rate Last Dose  . albuterol (PROVENTIL HFA;VENTOLIN HFA) 108 (90 Base) MCG/ACT inhaler 2 puff  2 puff Inhalation Q6H PRN Hildred Priest, MD      . atomoxetine (STRATTERA) capsule 60 mg  60 mg Oral Daily Hildred Priest, MD      . traZODone  (DESYREL) tablet 150 mg  150 mg Oral QHS Delman Kitten, MD   150 mg at 01/07/16 2215   Current Outpatient Prescriptions  Medication Sig Dispense Refill  . albuterol (PROVENTIL HFA;VENTOLIN HFA) 108 (90 Base) MCG/ACT inhaler Inhale 2 puffs into the lungs every 4 (four) hours as needed for shortness of breath. 1 Inhaler 0  . atomoxetine (STRATTERA) 60 MG capsule Take 1 capsule (60 mg total) by mouth daily. 30 capsule 0  . traZODone (DESYREL) 150 MG tablet Take 1 tablet (150 mg total) by mouth at bedtime. 30 tablet 0    Musculoskeletal: Strength & Muscle Tone: within normal limits Gait & Station: normal Patient leans: N/A  Psychiatric Specialty Exam: Review of Systems  Constitutional: Negative.   HENT: Negative.   Eyes: Negative.   Respiratory: Negative.   Cardiovascular: Negative.   Gastrointestinal: Negative.   Genitourinary: Negative.   Musculoskeletal: Negative.   Skin: Negative.   Neurological: Negative.   Endo/Heme/Allergies: Negative.   Psychiatric/Behavioral: Negative.     Blood pressure 131/78, pulse 81, temperature 98.3 F (36.8 C), temperature source Oral, resp. rate 20, height 5' 7"  (1.702 m), weight 80.287 kg (177 lb), SpO2 100 %.Body mass index is 27.72 kg/(m^2).  General Appearance: Well Groomed  Engineer, water::  Good  Speech:  Clear and Coherent  Volume:  Normal  Mood:  Euthymic  Affect:  Congruent  Thought Process:  Linear  Orientation:  Full (Time, Place, and Person)  Thought Content:  Hallucinations: None  Suicidal Thoughts:  No  Homicidal Thoughts:  No  Memory:  Immediate;   Good Recent;   Good Remote;   Good  Judgement:  Good  Insight:  Good  Psychomotor Activity:  Normal  Concentration:  Good  Recall:  Good  Fund of Knowledge:Good  Language: Good  Akathisia:  No  Handed:    AIMS (if indicated):     Assets:  Physical Health  ADL's:  Intact  Cognition: WNL  Sleep:      Treatment Plan Summary: Daily contact with patient to assess and evaluate  symptoms and progress in treatment and Medication management   The patient was alert and oriented, mood was euthymic and his affect was bright and reactive. He denied feelings of hopelessness helplessness or worthlessness.  Appears future oriented. Patient denies major problems with sleep, appetite, energy is sleep or concentration. Denies suicidality, homicidality or having auditory or visual hallucinations. He denies side effects from medications. He denies having any physical complaints.  Does not meet criteria for inpt psychiatric admission.   Disposition: No evidence of imminent risk to self or others at present.   Patient does not meet criteria for psychiatric inpatient admission.  Hildred Priest, MD 01/08/2016 12:16 PM

## 2016-05-27 ENCOUNTER — Emergency Department
Admission: EM | Admit: 2016-05-27 | Discharge: 2016-05-27 | Disposition: A | Discharge status: Home / Self Care | Payer: Medicaid Other | Attending: Emergency Medicine | Admitting: Emergency Medicine

## 2016-05-27 DIAGNOSIS — F909 Attention-deficit hyperactivity disorder, unspecified type: Secondary | ICD-10-CM | POA: Diagnosis not present

## 2016-05-27 DIAGNOSIS — F17210 Nicotine dependence, cigarettes, uncomplicated: Secondary | ICD-10-CM | POA: Insufficient documentation

## 2016-05-27 DIAGNOSIS — J45909 Unspecified asthma, uncomplicated: Secondary | ICD-10-CM | POA: Diagnosis not present

## 2016-05-27 DIAGNOSIS — R197 Diarrhea, unspecified: Secondary | ICD-10-CM | POA: Diagnosis not present

## 2016-05-27 DIAGNOSIS — R112 Nausea with vomiting, unspecified: Secondary | ICD-10-CM | POA: Diagnosis present

## 2016-05-27 DIAGNOSIS — Z7951 Long term (current) use of inhaled steroids: Secondary | ICD-10-CM | POA: Insufficient documentation

## 2016-05-27 LAB — COMPREHENSIVE METABOLIC PANEL
ALBUMIN: 4.7 g/dL (ref 3.5–5.0)
ALK PHOS: 64 U/L (ref 38–126)
ALT: 19 U/L (ref 17–63)
ANION GAP: 7 (ref 5–15)
AST: 21 U/L (ref 15–41)
BUN: 13 mg/dL (ref 6–20)
CALCIUM: 9.5 mg/dL (ref 8.9–10.3)
CO2: 27 mmol/L (ref 22–32)
Chloride: 103 mmol/L (ref 101–111)
Creatinine, Ser: 0.65 mg/dL (ref 0.61–1.24)
GFR calc Af Amer: >60 mL/min (ref >60)
GFR calc non Af Amer: >60 mL/min (ref >60)
GLUCOSE: 90 mg/dL (ref 65–99)
Potassium: 3.6 mmol/L (ref 3.5–5.1)
SODIUM: 137 mmol/L (ref 135–145)
Total Bilirubin: 1 mg/dL (ref 0.3–1.2)
Total Protein: 7.7 g/dL (ref 6.5–8.1)

## 2016-05-27 LAB — CBC
HEMATOCRIT: 40.3 % (ref 40.0–52.0)
HEMOGLOBIN: 14.1 g/dL (ref 13.0–18.0)
MCH: 29.9 pg (ref 26.0–34.0)
MCHC: 34.9 g/dL (ref 32.0–36.0)
MCV: 85.7 fL (ref 80.0–100.0)
Platelets: 217 10*3/uL (ref 150–440)
RBC: 4.71 MIL/uL (ref 4.40–5.90)
RDW: 14 % (ref 11.5–14.5)
WBC: 6.1 10*3/uL (ref 3.8–10.6)

## 2016-05-27 LAB — URINALYSIS COMPLETE WITH MICROSCOPIC (ARMC ONLY)
BACTERIA UA: NONE SEEN
Bilirubin Urine: NEGATIVE
Glucose, UA: NEGATIVE mg/dL
Hgb urine dipstick: NEGATIVE
KETONES UR: NEGATIVE mg/dL
Leukocytes, UA: NEGATIVE
Nitrite: NEGATIVE
PH: 5 (ref 5.0–8.0)
PROTEIN: NEGATIVE mg/dL
SQUAMOUS EPITHELIAL / LPF: NONE SEEN
Specific Gravity, Urine: 1.025 (ref 1.005–1.030)

## 2016-05-27 LAB — LIPASE, BLOOD: Lipase: 13 U/L (ref 11–51)

## 2016-05-27 NOTE — ED Provider Notes (Signed)
[facility] Emergency Department Provider Note ____________________________________________  Time seen:  I have reviewed the triage vital signs and the triage nursing note.  HISTORY  Chief Complaint Nausea and Emesis   Historian Patient  HPI [name] is a 25 y.o. male who is presenting today for nausea, vomiting, and diarrhea for about 2 days now. 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 He states that he would not have necessarily come to the emergency department, however he is under the impression that when he came to work feeling bad the first day he was sent home, but the second day, today he showed up to work and didn't feel that well and was recommended to get a work note from a physician.  He states that he does get overheated at work, and working 8-9 hours over [employer]. He said several episodes of emesis, nonbloody. He's had watery diarrhea, is not sure if there is been blood there. He's had some abdominal cramping and points to his upper abdomen. No known fevers or chills. No dizziness or passing out. Felt a little fatigued.  Denies food or travel exposures.      Past Medical History:  Diagnosis Date  . Anxiety   . Depression   . H/O suicide attempt    attempted hanging, gun to head, cut self  . Homelessness   . Suicide attempt [facility])     Patient Active Problem List   Diagnosis Date Noted  . h/o Attention deficit hyperactivity disorder (ADHD) 12/20/2015  . Stimulant use disorder ([facility]) (cocaine) 12/20/2015  . Opioid use disorder, severe, dependence (H[facility]) 12/20/2015  . Alcohol use disorder, moderate, dependence ([facility]) 12/20/2015  . Substance induced mood disorder ([facility]) 12/20/2015  . Tobacco use disorder 12/16/2015  . Cannabis use disorder, severe, dependence ([facility])   . Asthma 06/11/2012    Class: Chronic    Past Surgical History:  Procedure Laterality Date  .  NO PAST SURGERIES      Prior to Admission medications   Medication Sig Start Date End Date Taking? Authorizing Provider  albuterol (PROVENTIL HFA;VENTOLIN HFA) 108 (90 Base) MCG/ACT inhaler Inhale 2 puffs into the lungs every 6 (six) hours as needed for wheezing or shortness of breath. 01/08/16   Jene Every, MD  albuterol (PROVENTIL HFA;VENTOLIN HFA) 108 (90 Base) MCG/ACT inhaler Inhale 2 puffs into the lungs every 6 (six) hours as needed for wheezing or shortness of breath. 01/08/16   Jene Every, MD  atomoxetine (STRATTERA) 60 MG capsule Take 1 capsule (60 mg total) by mouth daily. 01/08/16   Jene Every, MD  traZODone (DESYREL) 150 MG tablet Take 1 tablet (150 mg total) by mouth at bedtime. 01/08/16   Jene Every, MD    No Known Allergies  Family History  Problem Relation Age of Onset  . Drug abuse Mother   . Mental illness Mother     Social History Social History  Substance Use Topics  . Smoking status: Current Every Day Smoker    Packs/day: 1.50    Years: 13.00    Types: Cigarettes  . Smokeless tobacco: Never Used  . Alcohol use Yes     Comment: last use 1 week.     Review of Systems  Constitutional: Negative for fever. Eyes: Negative for visual changes. ENT: Negative for sore throat. Cardiovascular: Negative for chest pain. Respiratory: Negative for shortness of breath. GastrointestinalPositive for abdominal cramping, and as per  history of present illness. . Genitourinary: Negative for dysuria. Musculoskeletal: Negative for back pain. Skin: Negative for rash. Neurological: Negative for headache. 10 point Review of Systems otherwise negative ____________________________________________   PHYSICAL EXAM:  VITAL SIGNS: ED Triage Vitals  Enc Vitals Group     BP 05/27/16 1906 (!) 139/108     Pulse Rate 05/27/16 1906 78     Resp 05/27/16 1906 16     Temp 05/27/16 1906 98.6 F (37 C)     Temp Source 05/27/16 1906 Oral     SpO2 05/27/16 1906 100 %      Weight 05/27/16 1906 180 lb (81.6 kg)     Height 05/27/16 1906  (1.651 m)     Head Circumference --      Peak Flow --      Pain Score 05/27/16 1907 6     Pain Loc --      Pain Edu? --      Excl. in GC? --      Constitutional: Alert and oriented. Well appearing and in no distress. HEENT   Head: Normocephalic and atraumatic.      Eyes: Conjunctivae are normal. PERRL. Normal extraocular movements.      Ears:         Nose: No congestion/rhinnorhea.   Mouth/Throat: Mucous membranes are moist.   Neck: No stridor. Cardiovascular/Chest: Normal rate, regular rhythm.  No murmurs, rubs, or gallops. Respiratory: Normal respiratory effort without tachypnea nor retractions. Breath sounds are clear and equal bilaterally. No wheezes/rales/rhonchi. Gastrointestinal: Soft. No distention, no guarding, no rebound. Mild tenderness in the epigastrium, noted tenderness in the lower abdomen.   Genitourinary/rectal:Deferred Musculoskeletal: Nontender with normal range of motion in all extremities. No joint effusions.  No lower extremity tenderness.  No edema. Neurologic:  Normal speech and language. No gross or focal neurologic deficits are appreciated. Skin:  Skin is warm, dry and intact. No rash noted. Psychiatric: Mood and affect are normal. Speech and behavior are normal. Patient exhibits appropriate insight and judgment.  ____________________________________________   EKG I, Governor Rooks, MD, the attending physician have personally viewed and interpreted all ECGs.  None ____________________________________________  LABS (pertinent positives/negatives)  Labs Reviewed  URINALYSIS COMPLETEWITH MICROSCOPIC ([facility] ONLY) - Abnormal; Notable for the following:       Result Value   Color, Urine YELLOW (*)    APPearance CLEAR (*)    All other components within normal limits  LIPASE, BLOOD  COMPREHENSIVE METABOLIC PANEL  CBC     ____________________________________________  RADIOLOGY All Xrays were viewed by me. Imaging interpreted by Radiologist.  None __________________________________________  PROCEDURES  Procedure(s) performed: None  Critical Care performed: None  ____________________________________________   ED COURSE / ASSESSMENT AND PLAN  Pertinent labs & imaging results that were available during my care of the patient were reviewed by me and considered in my medical decision making (see chart for details).   This patient is here essentially for work note. He states he would not necessarily come on his own, but is here for nausea, vomiting, and diarrhea for 2 days. He has not had any ongoing symptoms since he's been here, but he does still have some abdominal soreness.  On exam he has mild tenderness in epigastrium, no lower abdominal tenderness. His laboratory studies are all reassuring.  I am not recommending imaging at this point in time.   the patient would like to return to work as soon as possible, and if he is no longer symptomatic, I  am recommending that it is okay to go back to work tomorrow.   CONSULTATIONS:   None   Patient / Family / Caregiver informed of clinical course, medical decision-making process, and agree with plan.   I discussed return precautions, follow-up instructions, and discharged instructions with patient and/or family.   ___________________________________________   FINAL CLINICAL IMPRESSION(S) / ED DIAGNOSES   Final diagnoses:  Nausea vomiting and diarrhea              Note: This dictation was prepared with Dragon dictation. Any transcriptional errors that result from this process are unintentional    Governor Rooksebecca Cheyanne Lamison, MD 05/27/16 2040

## 2016-05-27 NOTE — ED Triage Notes (Signed)
Pt presents with X2 days of nausea, emesis, and diarrhea. Pt reports emesis has been dark green and diarrhea might have had blood in it. 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 Pt denies any chest pain, but (+) shortness of breath. Pt is A&O, in NAD, with respirations even, regular, and unlabored.

## 2016-05-27 NOTE — Discharge Instructions (Signed)
You were evaluated for nausea, vomiting, and diarrhea, and although no certain cause was found, your exam and evaluation are reassuring in the emergency department today.  Return to the emergency department for any fever, worsening abdominal pain, black or bloody stools, vomiting blood, or any other symptoms concerning to you.

## 2016-06-25 ENCOUNTER — Encounter: Payer: Self-pay | Admitting: Emergency Medicine

## 2016-06-25 ENCOUNTER — Emergency Department
Admission: EM | Admit: 2016-06-25 | Discharge: 2016-06-25 | Disposition: A | Discharge status: Other Acute Inpatient Hospital | Payer: Medicaid Other | Attending: Emergency Medicine | Admitting: Emergency Medicine

## 2016-06-25 ENCOUNTER — Inpatient Hospital Stay
Admission: RE | Admit: 2016-06-25 | Discharge: 2016-06-28 | DRG: 881 | Disposition: A | Discharge status: Home / Self Care | Payer: Medicaid Other | Source: Intra-hospital | Attending: Psychiatry | Admitting: Psychiatry

## 2016-06-25 DIAGNOSIS — Z79899 Other long term (current) drug therapy: Secondary | ICD-10-CM | POA: Insufficient documentation

## 2016-06-25 DIAGNOSIS — F1910 Other psychoactive substance abuse, uncomplicated: Secondary | ICD-10-CM | POA: Insufficient documentation

## 2016-06-25 DIAGNOSIS — R45851 Suicidal ideations: Secondary | ICD-10-CM

## 2016-06-25 DIAGNOSIS — F1220 Cannabis dependence, uncomplicated: Secondary | ICD-10-CM | POA: Diagnosis present

## 2016-06-25 DIAGNOSIS — F329 Major depressive disorder, single episode, unspecified: Principal | ICD-10-CM | POA: Diagnosis present

## 2016-06-25 DIAGNOSIS — G8929 Other chronic pain: Secondary | ICD-10-CM | POA: Diagnosis present

## 2016-06-25 DIAGNOSIS — J45909 Unspecified asthma, uncomplicated: Secondary | ICD-10-CM | POA: Diagnosis present

## 2016-06-25 DIAGNOSIS — G4700 Insomnia, unspecified: Secondary | ICD-10-CM | POA: Diagnosis present

## 2016-06-25 DIAGNOSIS — M25552 Pain in left hip: Secondary | ICD-10-CM | POA: Diagnosis present

## 2016-06-25 DIAGNOSIS — A159 Respiratory tuberculosis unspecified: Secondary | ICD-10-CM

## 2016-06-25 DIAGNOSIS — F1994 Other psychoactive substance use, unspecified with psychoactive substance-induced mood disorder: Secondary | ICD-10-CM | POA: Diagnosis not present

## 2016-06-25 DIAGNOSIS — M549 Dorsalgia, unspecified: Secondary | ICD-10-CM | POA: Diagnosis present

## 2016-06-25 DIAGNOSIS — Z818 Family history of other mental and behavioral disorders: Secondary | ICD-10-CM

## 2016-06-25 DIAGNOSIS — F909 Attention-deficit hyperactivity disorder, unspecified type: Secondary | ICD-10-CM | POA: Insufficient documentation

## 2016-06-25 DIAGNOSIS — Z62810 Personal history of physical and sexual abuse in childhood: Secondary | ICD-10-CM | POA: Diagnosis present

## 2016-06-25 DIAGNOSIS — F1290 Cannabis use, unspecified, uncomplicated: Secondary | ICD-10-CM | POA: Diagnosis present

## 2016-06-25 DIAGNOSIS — Z590 Homelessness: Secondary | ICD-10-CM

## 2016-06-25 DIAGNOSIS — F17210 Nicotine dependence, cigarettes, uncomplicated: Secondary | ICD-10-CM | POA: Insufficient documentation

## 2016-06-25 DIAGNOSIS — F17200 Nicotine dependence, unspecified, uncomplicated: Secondary | ICD-10-CM | POA: Diagnosis present

## 2016-06-25 DIAGNOSIS — F32A Depression, unspecified: Secondary | ICD-10-CM

## 2016-06-25 LAB — CBC
HCT: 43.5 % (ref 40.0–52.0)
HEMOGLOBIN: 14.6 g/dL (ref 13.0–18.0)
MCH: 30.1 pg (ref 26.0–34.0)
MCHC: 33.6 g/dL (ref 32.0–36.0)
MCV: 89.5 fL (ref 80.0–100.0)
PLATELETS: 194 10*3/uL (ref 150–440)
RBC: 4.86 MIL/uL (ref 4.40–5.90)
RDW: 14.1 % (ref 11.5–14.5)
WBC: 10.1 10*3/uL (ref 3.8–10.6)

## 2016-06-25 LAB — COMPREHENSIVE METABOLIC PANEL
ALT: 25 U/L (ref 17–63)
ANION GAP: 8 (ref 5–15)
AST: 19 U/L (ref 15–41)
Albumin: 4.7 g/dL (ref 3.5–5.0)
Alkaline Phosphatase: 65 U/L (ref 38–126)
BUN: 14 mg/dL (ref 6–20)
CHLORIDE: 102 mmol/L (ref 101–111)
CO2: 28 mmol/L (ref 22–32)
Calcium: 9.2 mg/dL (ref 8.9–10.3)
Creatinine, Ser: 0.77 mg/dL (ref 0.61–1.24)
GFR calc non Af Amer: >60 mL/min (ref >60)
Glucose, Bld: 96 mg/dL (ref 65–99)
POTASSIUM: 3.7 mmol/L (ref 3.5–5.1)
SODIUM: 138 mmol/L (ref 135–145)
Total Bilirubin: 1.1 mg/dL (ref 0.3–1.2)
Total Protein: 7.9 g/dL (ref 6.5–8.1)

## 2016-06-25 LAB — URINE DRUG SCREEN, QUALITATIVE (ARMC ONLY)
Amphetamines, Ur Screen: NOT DETECTED
BARBITURATES, UR SCREEN: NOT DETECTED
Benzodiazepine, Ur Scrn: NOT DETECTED
COCAINE METABOLITE, UR ~~LOC~~: NOT DETECTED
Cannabinoid 50 Ng, Ur ~~LOC~~: POSITIVE — AB
MDMA (ECSTASY) UR SCREEN: NOT DETECTED
METHADONE SCREEN, URINE: NOT DETECTED
Opiate, Ur Screen: NOT DETECTED
Phencyclidine (PCP) Ur S: NOT DETECTED
TRICYCLIC, UR SCREEN: NOT DETECTED

## 2016-06-25 LAB — SALICYLATE LEVEL

## 2016-06-25 LAB — ACETAMINOPHEN LEVEL

## 2016-06-25 LAB — ETHANOL: Alcohol, Ethyl (B): <5 mg/dL (ref <5)

## 2016-06-25 MED ORDER — ALUM & MAG HYDROXIDE-SIMETH 200-200-20 MG/5ML PO SUSP: 30.0000 mL | ORAL | Status: DC | PRN

## 2016-06-25 MED ORDER — TRAZODONE HCL 50 MG PO TABS: 150.0000 mg | ORAL_TABLET | Freq: Every day | ORAL | Status: DC

## 2016-06-25 MED ORDER — ALBUTEROL SULFATE HFA 108 (90 BASE) MCG/ACT IN AERS: 2.0000 | INHALATION_SPRAY | RESPIRATORY_TRACT | Status: DC | PRN

## 2016-06-25 MED ORDER — ALBUTEROL SULFATE HFA 108 (90 BASE) MCG/ACT IN AERS
2.0000 | INHALATION_SPRAY | RESPIRATORY_TRACT | Status: DC | PRN
  Filled 2016-06-25: qty 6.7

## 2016-06-25 MED ORDER — QUETIAPINE FUMARATE 200 MG PO TABS
400.0000 mg | ORAL_TABLET | Freq: Every day | ORAL | Status: DC
Start: 2016-06-25 — End: 2016-06-26
  Administered 2016-06-25: 400 mg via ORAL
  Filled 2016-06-25: qty 2

## 2016-06-25 MED ORDER — NICOTINE 21 MG/24HR TD PT24
21.0000 mg | MEDICATED_PATCH | Freq: Every day | TRANSDERMAL | Status: DC
  Administered 2016-06-26 – 2016-06-27 (×2): 21 mg via TRANSDERMAL
  Filled 2016-06-25 (×2): qty 1

## 2016-06-25 MED ORDER — MAGNESIUM HYDROXIDE 400 MG/5ML PO SUSP: 30.0000 mL | Freq: Every day | ORAL | Status: DC | PRN

## 2016-06-25 MED ORDER — TRAZODONE HCL 50 MG PO TABS
150.0000 mg | ORAL_TABLET | Freq: Every day | ORAL | Status: DC
  Administered 2016-06-25 – 2016-06-27 (×3): 150 mg via ORAL
  Filled 2016-06-25 (×3): qty 1

## 2016-06-25 MED ORDER — QUETIAPINE FUMARATE 200 MG PO TABS: 400.0000 mg | ORAL_TABLET | Freq: Every day | ORAL | Status: DC

## 2016-06-25 MED ORDER — ALBUTEROL SULFATE (2.5 MG/3ML) 0.083% IN NEBU: 2.5000 mg | INHALATION_SOLUTION | RESPIRATORY_TRACT | Status: DC | PRN

## 2016-06-25 MED ORDER — ACETAMINOPHEN 325 MG PO TABS: 650.0000 mg | ORAL_TABLET | Freq: Four times a day (QID) | ORAL | Status: DC | PRN
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
## 2016-06-25 NOTE — ED Notes (Signed)
Patient dressed out by this RN 

## 2016-06-25 NOTE — ED Notes (Signed)
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
## 2016-06-25 NOTE — ED Notes (Signed)
[facility] Patient alert, oriented cooperative, has taken breakfast without difficulty.

## 2016-06-25 NOTE — [facility] Group Notes (Signed)
[facility] Group Notes:  (Nursing/MHT/Case Management/Adjunct)  Date:  06/25/2016  Time:  9:48 PM  Type of Therapy:  Evening Wrap-up Group  Participation Level:  Active  Participation Quality:  Appropriate and Attentive  Affect:  Appropriate  Cognitive:  Alert and Appropriate  Insight:  Appropriate  Engagement in Group:  Engaged  Modes of Intervention:  Activity  Summary of Progress/Problems:  [name] MorrowChelsea [name] [name] 06/25/2016, 9:48 PM

## 2016-06-25 NOTE — ED Notes (Signed)
Patient reports SI w/o a plan.  Uses heroine daily, last use 2-3 days ago.  Denies withdrawal symptoms.  States he wants long term treatment for SA.  Orientation to unit and support offered.

## 2016-06-25 NOTE — Progress Notes (Signed)
Patient ID: [name], male   DOB: [birth year], 25 y.o.   MRN: [id number]  [facility] Pt is alert and oriented x4. He reports that his reason for admission is "anxiety, depression, suicide." Pt states that his friend stopped him from committing suicide and told him to come to the hospital. Pt rates anxiety 10/10 and depression 8/10 at this time. Pt continues to report passive SI. He does contract for safety. Denies HI/AVH at this time. Pt reports that he has not been compliant with his medications because he cannot afford them. He reports marijuana, alcohol, cocaine, and heroine abuse and states that he wishes to attend a treatment facility after discharge. Pt reports that his goal for admission is "to get better." Skin assessment performed and no contraband found. Pt has a tattoo on his left forearm. Pt oriented to unit. Medications administered with education. q15 minute safety checks maintained. Pt remains free from harm. Will continue to monitor.

## 2016-06-25 NOTE — ED Notes (Signed)
ENVIRONMENTAL ASSESSMENT  Potentially harmful objects out of patient reach: Yes.  Personal belongings secured: Yes.  Patient dressed in hospital provided attire only: Yes.  Plastic bags out of patient reach: Yes.  Patient care equipment (cords, cables, call bells, lines, and drains) shortened, removed, or accounted for: Yes.  Equipment and supplies removed from bottom of stretcher: Yes.  Potentially toxic materials out of patient reach: Yes.  Sharps container removed or out of patient reach: Yes.   BEHAVIORAL HEALTH ROUNDING  Patient sleeping: No.  Patient alert and oriented: yes  Behavior appropriate: Yes. ; If no, describe:  Nutrition and fluids offered: Yes  Toileting and hygiene offered: Yes  Sitter present: not applicable, Q 15 min safety rounds and observation.  Law enforcement present: Yes ODS  Pt reports he got into an argument with his mother about 30 minutes ago and "she disowned me". Pt reports he drinks alcohol occasionally and uses weed daily and heroin when he can get it.  Pt states he does not see a therapist or psychiatrist in the community and that he gets the RX's for his medications from "the hospital". Pt calm and cooperative at this time and contracts for safety with this RN.

## 2016-06-25 NOTE — ED Notes (Signed)
Pt. Alert and oriented, warm and dry, in no distress. Pt. Denies  HI, and AVH. Pt states having SI without a plan. Pt able to contract for safety with this Clinical research associatewriter. This Clinical research associatewriter made pt aware of transfer to [facility]. Pt. Encouraged to let nursing staff know of any concerns or needs.

## 2016-06-25 NOTE — ED Notes (Signed)
BEHAVIORAL HEALTH ROUNDING Patient sleeping: No. Patient alert and oriented: yes Behavior appropriate: Yes.  ; If no, describe:  Nutrition and fluids offered: Yes  Toileting and hygiene offered: Yes  Sitter present: not applicable Law enforcement present: Yes  

## 2016-06-25 NOTE — BH Assessment (Signed)
[name] by Dr. Toni Amendlapacs.  Attending Physician will be Dr. Ardyth HarpsHernandez.   Patient has [facility] room 314[facility] Gwen.   Intake Paper Work has been signed and placed on patient chart.  ER staff is aware of the admission [name]([facility].; Dr. Pershing ProudSchaevitz, ER MD; Sue LushAndrea, Patient's Nurse & Angelique Blonderenise, Patient Access).

## 2016-06-25 NOTE — ED Triage Notes (Signed)
Pt c/o anxiety, depression and feeling suicidal for 30 minutes following a verbal disagreement at home; pt calm and cooperative

## 2016-06-25 NOTE — ED Notes (Signed)
Report called to [facility] report given to [facility]

## 2016-06-25 NOTE — Tx Team (Signed)
Initial Treatment Plan 06/25/2016 10:41 PM [name] [id number]    PATIENT STRESSORS: Marital or family conflict Substance abuse   PATIENT STRENGTHS: Average or above average intelligence Physical Health   PATIENT IDENTIFIED PROBLEMS: Depression  Suicidal ideation  Substance abuse    "anxiety, depression, suicide"             DISCHARGE CRITERIA:  Adequate post-discharge living arrangements Improved stabilization in mood, thinking, and/or behavior Motivation to continue treatment in a less acute level of care Need for constant or close observation no longer present Reduction of life-threatening or endangering symptoms to within safe limits Verbal commitment to aftercare and medication compliance  PRELIMINARY DISCHARGE PLAN: Attend aftercare/continuing care group Attend 12-step recovery group Outpatient therapy  PATIENT/FAMILY INVOLVEMENT: This treatment plan has been presented to and reviewed with the patient, [name], and/or family member.  The patient and family have been given the opportunity to ask questions and make suggestions.  Beckie BusingMichelle L Parsells, RN 06/25/2016, 10:41 PM

## 2016-06-25 NOTE — ED Provider Notes (Signed)
[facility] Emergency Department Provider Note ____________________________________________   I have reviewed the triage vital signs and the triage nursing note.  HISTORY  Chief Complaint Anxiety; Suicidal; and Depression   Historian Patient  HPI [name] is a 25 y.o. male with a history of depression, prior psychiatric admissions for suicidal ideation and attempts (in notes reported as attempted hanging and patient reports prior jumping in front of traffic), who is here today for evaluation for worsening depression, and suicidal thoughts. He also has a history of substance abuse including alcohol, tobacco, marijuana, and heroin. He states he has been living with his mom, but she has a "crack problem" and he got into an argument with her this morning and started having suicidal thoughts. He states he was thinking about jumping off a bridge, or running out into traffic.  He states unstable and depressed mood has been "coming on for a while."  He reports feeling safe here in Hospital and does not have any immediate thoughts of trying to hurt himself while here in the emergency department.    Past Medical History:  Diagnosis Date  . Anxiety   . Depression   . H/O suicide attempt    attempted hanging, gun to head, cut self  . Homelessness   . Suicide attempt [facility](HCC)     Patient Active Problem List   Diagnosis Date Noted  . h/o Attention deficit hyperactivity disorder (ADHD) 12/20/2015  . Stimulant use disorder (HCC) (cocaine) 12/20/2015  . Opioid use disorder, severe, dependence (HCC) 12/20/2015  . Alcohol use disorder, moderate, dependence (HCC) 12/20/2015  . Substance induced mood disorder (HCC) 12/20/2015  . Tobacco use disorder 12/16/2015  . Cannabis use disorder, severe, dependence (HCC)   . Asthma 06/11/2012    Class: Chronic    Past Surgical History:  Procedure Laterality Date  . NO PAST SURGERIES      Prior to Admission medications    Medication Sig Start Date End Date Taking? Authorizing Provider  QUEtiapine (SEROQUEL) 400 MG tablet Take 400 mg by mouth at bedtime.   Yes Historical Provider, MD  QUEtiapine (SEROQUEL) 50 MG tablet Take 50 mg by mouth 2 (two) times daily.   Yes Historical Provider, MD  albuterol (PROVENTIL HFA;VENTOLIN HFA) 108 (90 Base) MCG/ACT inhaler Inhale 2 puffs into the lungs every 6 (six) hours as needed for wheezing or shortness of breath. 01/08/16   Jene Everyobert Kinner, MD  albuterol (PROVENTIL HFA;VENTOLIN HFA) 108 (90 Base) MCG/ACT inhaler Inhale 2 puffs into the lungs every 6 (six) hours as needed for wheezing or shortness of breath. 01/08/16   Jene Everyobert Kinner, MD  atomoxetine (STRATTERA) 60 MG capsule Take 1 capsule (60 mg total) by mouth daily. 01/08/16   Jene Everyobert Kinner, MD  traZODone (DESYREL) 150 MG tablet Take 1 tablet (150 mg total) by mouth at bedtime. 01/08/16   Jene Everyobert Kinner, MD    No Known Allergies  Family History  Problem Relation Age of Onset  . Drug abuse Mother   . Mental illness Mother     Social History Social History  Substance Use Topics  . Smoking status: Current Every Day Smoker    Packs/day: 1.50    Years: 13.00    Types: Cigarettes  . Smokeless tobacco: Never Used  . Alcohol use Yes     Comment: last use 1 week.     Review of Systems  Constitutional: Negative for fever. Eyes: Negative for visual changes. ENT: Negative for sore throat. Cardiovascular: Negative for chest pain.  Respiratory: Negative for shortness of breath. Gastrointestinal: Negative for abdominal pain, vomiting and diarrhea. Genitourinary: Negative for dysuria. Musculoskeletal: Negative for back pain. Skin: Negative for rash. Neurological: Negative for headache. 10 point Review of Systems otherwise negative ____________________________________________   PHYSICAL EXAM:  VITAL SIGNS: ED Triage Vitals  Enc Vitals Group     BP 06/25/16 0611 (!) 129/54     Pulse Rate 06/25/16 0611 73     Resp  06/25/16 0611 18     Temp 06/25/16 0611 98.1 F (36.7 C)     Temp Source 06/25/16 0611 Oral     SpO2 06/25/16 0611 98 %     Weight 06/25/16 0612 173 lb 8 oz (78.7 kg)     Height 06/25/16 0612 5\' 5"  (1.651 m)     Head Circumference --      Peak Flow --      Pain Score --      Pain Loc --      Pain Edu? --      Excl. in GC? --      Constitutional: Alert and oriented. Well appearing and in no distress. HEENT   Head: Normocephalic and atraumatic.      Eyes: Conjunctivae are normal. PERRL. Normal extraocular movements.      Ears:         Nose: No congestion/rhinnorhea.   Mouth/Throat: Mucous membranes are moist.   Neck: No stridor. Cardiovascular/Chest: Normal rate, regular rhythm.  No murmurs, rubs, or gallops. Respiratory: Normal respiratory effort without tachypnea nor retractions. Breath sounds are clear and equal bilaterally. No wheezes/rales/rhonchi. Gastrointestinal: Soft. No distention, no guarding, no rebound. Nontender.    Genitourinary/rectal:Deferred Musculoskeletal: Nontender with normal range of motion in all extremities. No joint effusions.  No lower extremity tenderness.  No edema. Neurologic:  Normal speech and language. No gross or focal neurologic deficits are appreciated. Skin:  Skin is warm, dry and intact. No rash noted. Psychiatric: Affect appear normal, however patient does report depressed mood, racing thoughts, and suicidal thoughts including jumping out into traffic, or jumping off a bridge.  ____________________________________________   EKG I, Governor Rooks, MD, the attending physician have personally viewed and interpreted all ECGs.  None ____________________________________________  LABS (pertinent positives/negatives)  Labs Reviewed  ACETAMINOPHEN LEVEL - Abnormal; Notable for the following:       Result Value   Acetaminophen (Tylenol), Serum <10 (*)    All other components within normal limits  URINE DRUG SCREEN, QUALITATIVE ([facility]  ONLY) - Abnormal; Notable for the following:    Cannabinoid 50 Ng, Ur [place] POSITIVE (*)    All other components within normal limits  COMPREHENSIVE METABOLIC PANEL  ETHANOL  SALICYLATE LEVEL  CBC    ____________________________________________  RADIOLOGY All Xrays were viewed by me. Imaging interpreted by Radiologist.  None __________________________________________  PROCEDURES  Procedure(s) performed: None  Critical Care performed: None  ____________________________________________   ED COURSE / ASSESSMENT AND PLAN  Pertinent labs & imaging results that were available during my care of the patient were reviewed by me and considered in my medical decision making (see chart for details).   Mr. Dunlevy is here with worsening depression/mood instability with thoughts of suicidal ideation this morning without attempt. He does have a history significant for mental illness with suicide attempt and suicidal ideation in the past as well as psychiatric admissions, and given this I think is relatively high risk from the standpoint of he needs to be at evaluated by psychiatry today. I'll place him  on involuntary commitment until assessed by psychiatrist. At the present moment, he is calm and interactive, does not appear to be withdrawn, and I don't think he is of immediate danger to himself while in the emergency department and he is off of one to one sitter.  He is ok for transfer to ED [facility] for consults and dispo.   CONSULTATIONS:   TTS and psychiatry.   Patient / Family / Caregiver informed of clinical course, medical decision-making process, and agree with plan.   ___________________________________________   FINAL CLINICAL IMPRESSION(S) / ED DIAGNOSES   Final diagnoses:  Polysubstance abuse  Suicidal ideation              Note: This dictation was prepared with Dragon dictation. Any transcriptional errors that result from this process are unintentional     Governor Rooks, MD 06/25/16 [phone]

## 2016-06-26 ENCOUNTER — Inpatient Hospital Stay: Payer: Medicaid Other

## 2016-06-26 DIAGNOSIS — F329 Major depressive disorder, single episode, unspecified: Principal | ICD-10-CM

## 2016-06-26 DIAGNOSIS — F32A Depression, unspecified: Secondary | ICD-10-CM

## 2016-06-26 LAB — LIPID PANEL
Cholesterol: 184 mg/dL (ref 0–200)
HDL: 57 mg/dL (ref >40)
LDL Cholesterol: 110 mg/dL — ABNORMAL HIGH (ref 0–99)
TRIGLYCERIDES: 84 mg/dL (ref <150)
Total CHOL/HDL Ratio: 3.2 RATIO
VLDL: 17 mg/dL (ref 0–40)

## 2016-06-26 LAB — HEMOGLOBIN A1C: Hgb A1c MFr Bld: 4.8 % (ref 4.0–6.0)

## 2016-06-26 LAB — TSH: TSH: 0.616 u[IU]/mL (ref 0.350–4.500)

## 2016-06-26 MED ORDER — IBUPROFEN 600 MG PO TABS
600.0000 mg | ORAL_TABLET | Freq: Four times a day (QID) | ORAL | Status: DC | PRN
  Administered 2016-06-26: 600 mg via ORAL
  Filled 2016-06-26: qty 1

## 2016-06-26 MED ORDER — ACETAMINOPHEN 500 MG PO TABS
1000.0000 mg | ORAL_TABLET | Freq: Four times a day (QID) | ORAL | Status: DC | PRN
  Administered 2016-06-26: 1000 mg via ORAL
  Filled 2016-06-26: qty 2

## 2016-06-26 MED ORDER — ATOMOXETINE HCL 25 MG PO CAPS
25.0000 mg | ORAL_CAPSULE | Freq: Every day | ORAL | Status: DC
  Filled 2016-06-26: qty 1

## 2016-06-26 MED ORDER — GABAPENTIN 100 MG PO CAPS
100.0000 mg | ORAL_CAPSULE | Freq: Three times a day (TID) | ORAL | Status: DC
  Administered 2016-06-26 – 2016-06-27 (×5): 100 mg via ORAL
  Filled 2016-06-26 (×5): qty 1

## 2016-06-26 MED ORDER — ATOMOXETINE HCL 40 MG PO CAPS
40.0000 mg | ORAL_CAPSULE | Freq: Every day | ORAL | Status: DC
  Administered 2016-06-26 – 2016-06-27 (×2): 40 mg via ORAL
  Filled 2016-06-26 (×2): qty 1
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
## 2016-06-26 NOTE — [facility] Group Notes (Signed)
Goals Group  Date/Time: 9:00AM Type of Therapy and Topic: Group Therapy: Goals Group: SMART Goals  ?  Participation Level: Moderate  ?  Description of Group:  ?  The purpose of a daily goals group is to assist and guide patients in setting recovery/wellness-related goals. The objective is to set goals as they relate to the crisis in which they were admitted. Patients will be using SMART goal modalities to set measurable goals. Characteristics of realistic goals will be discussed and patients will be assisted in setting and processing how one will reach their goal. Facilitator will also assist patients in applying interventions and coping skills learned in psycho-education groups to the SMART goal and process how one will achieve defined goal.  ?  Therapeutic Goals:  ?  -Patients will develop and document one goal related to or their crisis in which brought them into treatment.  -Patients will be guided by LCSW using SMART goal setting modality in how to set a measurable, attainable, realistic and time sensitive goal.  -Patients will process barriers in reaching goal.  -Patients will process interventions in how to overcome and successful in reaching goal.  ?  Patient's Goal: Patient invited but did not attend. ?  Therapeutic Modalities:  Motivational Interviewing  Cognitive Behavioral Therapy  Crisis Intervention Model  SMART goals setting   Lynden OxfordKadijah R. Jakari Sada, MSW, LCSW-A 06/26/2016, 10:51AM
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
## 2016-06-26 NOTE — [facility] Counselor (Deleted)
Adult Comprehensive Assessment  Patient ID: Rosezena Sensorngus Weis, male   DOB: [birth year], 25 y.o.   MRN: [id number]  Information Source: Information source: Patient  Current Stressors:     Living/Environment/Situation:  Living Arrangements: Non-relatives/Friends  Family History:     Childhood History:     Education:     Employment/Work Situation:      Surveyor, quantityinancial Resources:      Alcohol/Substance Abuse:      Social Support System:      Leisure/Recreation:      Strengths/Needs:      Discharge Plan:      Summary/Recommendations:   Summary and Recommendations (to be completed by the evaluator): Patient is a 25 year old male admitted with a diagnosis of Major Depression. Patient presented to the hospital with SI, depression, anxiety and substance abuse. Patient reports primary triggers for admission were relationships conflicts and thoughts of his father's death three years previous to admission. Pt reports he was SI with a plan. Pt reports stressors are numerous and include unemployment and housing issues Pt reports he is currently not HI or AVH, but still has suicidal ideations without a plan, but will contract for safety . [facility] in the last year with a similar presentation. He reports he wants inpatient substance abuse treatment. CSW assessing. Patient will benefit from crisis stabilization, medication evaluation, group therapy and psycho education in addition to case management for discharge. At discharge, it is recommended that patient remain compliant with established discharge plan and continued treatment.   Dorothe PeaJonathan F Damon Hargrove. 06/26/2016

## 2016-06-26 NOTE — Plan of Care (Signed)
Problem: Safety: Goal: Periods of time without injury will increase Outcome: Progressing Continues to endorse SI without a specific plan. Verbally contracts for safety. Safety maintained with 15 minute checks. Will continue to monitor.

## 2016-06-26 NOTE — [facility] Suicide Risk Assessment (Signed)
[facility] INPATIENT:  Family/Significant Other Suicide Prevention Education  Suicide Prevention Education:  Patient Refusal for Family/Significant Other Suicide Prevention Education: The patient Nasire Desa has refused to provide written consent for family/significant other to be provided Family/Significant Other Suicide Prevention Education during admission and/or prior to discharge.  Physician notified.  CSW completed CSW with pt.  Dorothe PeaJonathan F Magdelyn Roebuck 06/26/2016, 3:19 PM

## 2016-06-26 NOTE — [facility] Group Notes (Signed)
[facility] LCSW Group Therapy   06/26/2016 9:30am  Type of Therapy: Group Therapy   Participation Level: Patient invited but did not attend.  Participation Quality:    Hampton AbbotKadijah Arya Luttrull, MSW, [facility]

## 2016-06-26 NOTE — Progress Notes (Signed)
D: Pt noted to be out of room, up on unit for most of the day. Socializing appropriately with peers. Continues to endorse SI without a specific plan. Reports left hip pain 10/10, noted to be limping during nurse/patient interaction, but walking without difficulty with peers down the hallway.   A: Support and encouragement provided. Medications administered as ordered. IBU and tylenol given as ordered PRN. Every 15 minute checks for safety.   R: Medication compliant. Attends group. Contracts verbally for safety.   Will continue to monitor.
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
## 2016-06-27 LAB — PROLACTIN: PROLACTIN: 16.1 ng/mL — AB (ref 4.0–15.2)

## 2016-06-27 MED ORDER — ALBUTEROL SULFATE HFA 108 (90 BASE) MCG/ACT IN AERS: 2.0000 | INHALATION_SPRAY | Freq: Four times a day (QID) | RESPIRATORY_TRACT | 0 refills | Status: AC | PRN

## 2016-06-27 MED ORDER — TRAZODONE HCL 150 MG PO TABS: 150.0000 mg | ORAL_TABLET | Freq: Every evening | ORAL | 0 refills | Status: DC | PRN

## 2016-06-27 MED ORDER — FLUOXETINE HCL 10 MG PO CAPS: 10.0000 mg | ORAL_CAPSULE | Freq: Every day | ORAL | 0 refills | Status: DC

## 2016-06-27 MED ORDER — GABAPENTIN 100 MG PO CAPS: 100.0000 mg | ORAL_CAPSULE | Freq: Three times a day (TID) | ORAL | 0 refills | Status: DC

## 2016-06-27 MED ORDER — CYCLOBENZAPRINE HCL 5 MG PO TABS: 5.0000 mg | ORAL_TABLET | Freq: Three times a day (TID) | ORAL | 0 refills | Status: DC | PRN

## 2016-06-27 MED ORDER — FLUOXETINE HCL 10 MG PO CAPS
10.0000 mg | ORAL_CAPSULE | Freq: Every day | ORAL | Status: DC
  Administered 2016-06-27: 10 mg via ORAL

## 2016-06-27 MED ORDER — CYCLOBENZAPRINE HCL 10 MG PO TABS
5.0000 mg | ORAL_TABLET | Freq: Three times a day (TID) | ORAL | Status: DC | PRN
  Filled 2016-06-27: qty 1

## 2016-06-27 MED ORDER — ATOMOXETINE HCL 40 MG PO CAPS: 40.0000 mg | ORAL_CAPSULE | Freq: Every day | ORAL | 0 refills | Status: DC
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
## 2016-06-27 NOTE — [facility] Group Notes (Signed)
[facility] LCSW Group Therapy   06/27/2016  9:30 am   Type of Therapy: Group Therapy   Participation Level: Active   Participation Quality: Attentive, Sharing and Supportive   Affect: Depressed and Flat   Cognitive: Alert and Oriented   Insight: Developing/Improving and Engaged   Engagement in Therapy: Developing/Improving and Engaged   Modes of Intervention: Clarification, Confrontation, Discussion, Education, Exploration, Limit-setting, Orientation, Problem-solving, Rapport Building, Dance movement psychotherapisteality Testing, Socialization and Support   Summary of Progress/Problems: The topic for group today was emotional regulation. This group focused on both positive and negative emotion identification and allowed  group members to process ways to identify feelings, regulate negative emotions, and find healthy ways to manage internal/external emotions. Group members were asked to reflect on a time when their reaction to an emotion led to a negative outcome and explored how alternative responses using emotion regulation would have benefited them. Group members were also asked to discuss a time when emotion regulation was utilized when a negative emotion was experienced. Patient stated that he does not have any emotions to identify at this time. He stated that self medicating helps regulate his emotions and has poor insight on the side effects of substances. CSW explained healthy coping mechanisms to patient and he identify singing as a healthy coping mechanism.     Hampton AbbotKadijah Greidy Sherard, MSW, Theresia MajorsLCSWA

## 2016-06-27 NOTE — Progress Notes (Signed)
Recreation Therapy Notes  Date: 09.06.17 Time: 1:00 pm Location: Craft Room  Group Topic: Self-esteem  Goal Area(s) Addresses:  Patient will write at least one positive trait about self. Patient will verbalize benefit of having a healthy self-esteem.  Behavioral Response: Attentive, Disruptive  Intervention: I Am  Activity: Patients were given worksheets with the letter I on it and instructed to write as many positive traits inside the letter.  Education: LRT educated patients on ways they can increase their self-esteem.  Education Outcome: In group clarification offered  Clinical Observations/Feedback: Patient completed activity by writing positive traits. Patient did not contribute to group discussion. Patient would laugh during group discussion. LRT redirected patient and patient complied.  Jacquelynn CreeGreene,Kyjuan Gause M, LRT/CTRS 06/27/2016 2:58 PM

## 2016-06-27 NOTE — Progress Notes (Signed)
D: Pt denies SI/HI/AVH. Pt is pleasant and cooperative. Affect is flat sad and depressed, isolates to self. Pt appears less anxious no bizarre behavior noted.  A: Pt was offered support and encouragement. Pt was given scheduled medications. Pt was encouraged to attend groups. Q 15 minute checks were done for safety.  R:Pt did not attend group. Pt is taking medication. Pt has no complaints.Pt receptive to treatment and safety maintained on unit.

## 2016-06-27 NOTE — Plan of Care (Signed)
Problem: Coping: Goal: Ability to demonstrate self-control will improve Outcome: Progressing Patient demonstrated self control.
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
## 2016-06-27 NOTE — [facility] Suicide Risk Assessment (Signed)
[facility]   Principal Problem: Depression Discharge Diagnoses:  Patient Active Problem List   Diagnosis Date Noted  . 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 Stimulant use disorder (HCC) (cocaine) [F15.90] 12/20/2015  . Opioid use disorder, severe, dependence (HCC) [F11.20] 12/20/2015  . Alcohol use disorder, moderate, dependence (HCC) [F10.20] 12/20/2015  . Tobacco use disorder [F17.200] 12/16/2015  . Cannabis use disorder, severe, dependence (HCC) [F12.20]   . Asthma [J45.909] 06/11/2012    Class: Chronic    Psychiatric Specialty Exam: ROS  Blood pressure 102/88, pulse 92, temperature 98 F (36.7 C), temperature source Oral, resp. rate 18, height 5' 4.96" (1.65 m), weight 78.5 kg (173 lb), SpO2 100 %.Body mass index is 28.82 kg/m.                                                       Mental Status Per Nursing Assessment::   On Admission:  Suicidal ideation indicated by patient, Self-harm thoughts  Demographic Factors:  Male, Caucasian, Low socioeconomic status and Unemployed  Loss Factors: Financial problems/change in socioeconomic status  Historical Factors: Impulsivity  Risk Reduction Factors:   Positive therapeutic relationship  Continued Clinical Symptoms:  Alcohol/Substance Abuse/Dependencies Previous Psychiatric Diagnoses and Treatments  Cognitive Features That Contribute To Risk:  None    Suicide Risk:  Minimal: No identifiable suicidal ideation.  Patients presenting with no risk factors but with morbid ruminations; may be classified as minimal risk based on the severity of the depressive symptoms  Follow-up Information    [facility] .   Why:     Please arrive to the walk-in clinic between the hours of 8am-2:30pm for an assessment for medication management, substance abuse treatment and therapy.  Please call Unk PintoHarvey Bryant at [phone] for questions and assistance. Contact  information: [address] [place] [address] [phone]        [facility] .   Why:  Please arrive to be admitted for long-term residential substance abuse treatment and call Eugenie BirksWilliam Greene when you are on your way, in order to be admitted. Contact information: [facility] [address] [place], [place] [address] Ph: [phone] Fax: Not required or needed by agency           Jimmy FootmanHernandez-Gonzalez,  Prentiss Hammett, MD 06/27/2016, 4:24 PM

## 2016-06-27 NOTE — Progress Notes (Signed)
  [facility] Discharge [facility] :  Will you be returning to the same living situation after discharge:  No. Pt will be [facility] to the [facility] [place] At discharge, do you have transportation home?: Yes, pt will be provided with a bus pass Do you have the ability to pay for your medications: Yes,  pt will be provided with prescriptions upon discharge  Release of information consent forms completed and in the chart;  Patient's signature needed at discharge.  Patient to Follow up at: Follow-up Information    [facility] .   Why:     Please arrive to the walk-in clinic between the hours of 8am-2:30pm for an assessment for medication management, substance abuse treatment and therapy.  Please call Unk PintoHarvey Bryant at [phone] for questions and assistance. Contact information: [address] [place] [address] [phone]        [facility] .   Why:  Please arrive to be admitted for long-term residential substance abuse treatment and call Eugenie BirksWilliam Greene when you are on your way, in order to be admitted. Contact information: [facility] [address] [place], [place] [address] Ph: [phone] Fax: Not required or needed by agency          Next level of care provider has access to [facility] Link:no  Safety Planning and Suicide Prevention discussed: Yes,  Completed with pt  Have you used any form of tobacco in the last 30 days? (Cigarettes, Smokeless Tobacco, Cigars, and/or Pipes): Yes  Has patient been referred to the Quitline?: Patient refused referral  Patient has been referred for addiction treatment: Yes  Dorothe PeaJonathan F Harmony Sandell 06/27/2016, 5:12 PM

## 2016-06-27 NOTE — Tx Team (Signed)
Interdisciplinary Treatment and Diagnostic Plan Update  06/27/2016 Time of Session: 11:12 AM  [name] MRN: [id number]  Principal Diagnosis: Depression  Secondary Diagnoses: Principal Problem:   Unspecified depressive disorder Active Problems:   Asthma   Cannabis use disorder, severe, dependence (HCC)   Tobacco use disorder   Stimulant use disorder (HCC) (cocaine)   Opioid use disorder, severe, dependence (HCC)   Alcohol use disorder, moderate, dependence (HCC)   Current Medications:  Current Facility-Administered Medications  Medication Dose Route Frequency Provider Last Rate Last Dose  . acetaminophen (TYLENOL) tablet 1,000 mg  1,000 mg Oral Q6H PRN Jimmy FootmanAndrea Hernandez-Gonzalez, MD   1,000 mg at 06/26/16 1243  . albuterol (PROVENTIL) (2.5 MG/3ML) 0.083% nebulizer solution 2.5 mg  2.5 mg Nebulization Q4H PRN Jimmy FootmanAndrea Hernandez-Gonzalez, MD      . alum & mag hydroxide-simeth (MAALOX/MYLANTA) 200-200-20 MG/5ML suspension 30 mL  30 mL Oral Q4H PRN Audery AmelJohn T Clapacs, MD      . FLUoxetine (PROZAC) capsule 10 mg  10 mg Oral Daily Jimmy FootmanAndrea Hernandez-Gonzalez, MD      . gabapentin (NEURONTIN) capsule 100 mg  100 mg Oral TID Jimmy FootmanAndrea Hernandez-Gonzalez, MD   100 mg at 06/27/16 0750  . ibuprofen (ADVIL,MOTRIN) tablet 600 mg  600 mg Oral Q6H PRN Jimmy FootmanAndrea Hernandez-Gonzalez, MD   600 mg at 06/26/16 1137  . magnesium hydroxide (MILK OF MAGNESIA) suspension 30 mL  30 mL Oral Daily PRN Audery AmelJohn T Clapacs, MD      . nicotine (NICODERM CQ - dosed in mg/24 hours) patch 21 mg  21 mg Transdermal Daily Jimmy FootmanAndrea Hernandez-Gonzalez, MD   21 mg at 06/27/16 0752  . traZODone (DESYREL) tablet 150 mg  150 mg Oral QHS Audery AmelJohn T Clapacs, MD   150 mg at 06/26/16 2152    PTA Medications: Prescriptions Prior to Admission  Medication Sig Dispense Refill Last Dose  . atomoxetine (STRATTERA) 60 MG capsule Take 1 capsule (60 mg total) by mouth daily. 30 capsule 0 Past Week at Unknown time  . QUEtiapine (SEROQUEL) 400 MG tablet Take  400 mg by mouth at bedtime.   Past Week at Unknown time  . QUEtiapine (SEROQUEL) 50 MG tablet Take 50 mg by mouth 2 (two) times daily.   Past Week at Unknown time  . traZODone (DESYREL) 150 MG tablet Take 1 tablet (150 mg total) by mouth at bedtime. 30 tablet 0 Past Week at Unknown time  . [DISCONTINUED] albuterol (PROVENTIL HFA;VENTOLIN HFA) 108 (90 Base) MCG/ACT inhaler Inhale 2 puffs into the lungs every 6 (six) hours as needed for wheezing or shortness of breath. 1 Inhaler 0 Past Week at Unknown time    Treatment Modalities: Medication Management, Group therapy, Case management,  1 to 1 session with clinician, Psychoeducation, Recreational therapy.   Physician Treatment Plan for Primary Diagnosis: Depression Long Term Goal(s): Improvement in symptoms so as ready for discharge  Short Term Goals: Ability to verbalize feelings will improve, Ability to disclose and discuss suicidal ideas, Ability to identify and develop effective coping behaviors will improve and Compliance with prescribed medications will improve  Medication Management: Evaluate patient's response, side effects, and tolerance of medication regimen.  Therapeutic Interventions: 1 to 1 sessions, Unit Group sessions and Medication administration.  Evaluation of Outcomes: Adequate for Discharge  Physician Treatment Plan for Secondary Diagnosis: Principal Problem:   Unspecified depressive disorder Active Problems:   Asthma   Cannabis use disorder, severe, dependence (HCC)   Tobacco use disorder   Stimulant use disorder (HCC) (cocaine)  Opioid use disorder, severe, dependence (HCC)   Alcohol use disorder, moderate, dependence (HCC)   Long Term Goal(s): Improvement in symptoms so as ready for discharge  Short Term Goals: Ability to identify changes in lifestyle to reduce recurrence of condition will improve, Ability to demonstrate self-control will improve and Ability to identify triggers associated with substance  abuse/mental health issues will improve  Medication Management: Evaluate patient's response, side effects, and tolerance of medication regimen.  Therapeutic Interventions: 1 to 1 sessions, Unit Group sessions and Medication administration.  Evaluation of Outcomes: Adequate for Discharge   RN Treatment Plan for Primary Diagnosis: Depression Long Term Goal(s): Knowledge of disease and therapeutic regimen to maintain health will improve  Short Term Goals: Ability to verbalize feelings will improve, Ability to identify and develop effective coping behaviors will improve and Compliance with prescribed medications will improve  Medication Management: RN will administer medications as ordered by provider, will assess and evaluate patient's response and provide education to patient for prescribed medication. RN will report any adverse and/or side effects to prescribing provider.  Therapeutic Interventions: 1 on 1 counseling sessions, Psychoeducation, Medication administration, Evaluate responses to treatment, Monitor vital signs and CBGs as ordered, Perform/monitor CIWA, COWS, AIMS and Fall Risk screenings as ordered, Perform wound care treatments as ordered.  Evaluation of Outcomes: Adequate for Discharge   LCSW Treatment Plan for Primary Diagnosis: Depression Long Term Goal(s): Safe transition to appropriate next level of care at discharge, Engage patient in therapeutic group addressing interpersonal concerns.  Short Term Goals: Engage patient in aftercare planning with referrals and resources, Increase social support, Increase ability to appropriately verbalize feelings, Increase emotional regulation, Facilitate acceptance of mental health diagnosis and concerns, Facilitate patient progression through stages of change regarding substance use diagnoses and concerns, Identify triggers associated with mental health/substance abuse issues and Increase skills for wellness and recovery  Therapeutic  Interventions: Assess for all discharge needs, 1 to 1 time with Social worker, Explore available resources and support systems, Assess for adequacy in community support network, Educate family and significant other(s) on suicide prevention, Complete Psychosocial Assessment, Interpersonal group therapy.  Evaluation of Outcomes: Adequate for Discharge   Progress in Treatment: Attending groups: Intermittently Participating in groups: Intermittently Taking medication as prescribed: Yes, MD continues to assess for medication changes as needed Toleration medication: Yes, no side effects reported at this time Family/Significant other contact made: CSW, still assessing for appropriate contacts Patient understands diagnosis: Yes Discussing patient identified problems/goals with staff: Yes Medical problems stabilized or resolved:  Yes  Denies suicidal/homicidal ideation: Yes Issues/concerns per patient self-inventory: None Other: N/A  New problem(s) identified: None identified at this time.   New Short Term/Long Term Goal(s): None identified at this time.   Discharge Plan or Barriers: Pt will discharge to the [facility] and will follow up with [facility] for medication management, substance abuse treatment and therapy   Reason for Continuation of Hospitalization: Adequate for discharge per MD    Estimated /Date of discharge: 06/27/16  Attendees: Patient: [name] 06/27/2016  10:49 AM  Physician: Dr. Ardyth Harps, MD 06/27/2016  10:49 AM  Nursing: Elenore Paddy, RN 06/27/2016  10:49 AM  RN Care Manager: 06/27/2016  10:49 AM  Social Worker: Dorothe Pea. Durward Parcel  06/27/2016  10:49 AM  Recreational Therapist: Hershal Coria, LRT 06/27/2016  10:49 AM  Other: Alveria Apley, PA student 06/27/2016  10:49 AM  Other:  06/27/2016  10:49 AM  Other: 06/27/2016  10:49 AM    Scribe for  Treatment Team:  Dorothe Pea. Bexleigh Theriault MSW, LCSWA, LCAS

## 2016-06-27 NOTE — [facility] Group Notes (Signed)
[facility] Group Notes:  (Nursing/MHT/Case Management/Adjunct)  Date:  06/27/2016  Time:  4:04 PM  Type of Therapy:  Psychoeducational Skills  Participation Level:  Did Not Attend  Lynelle SmokeCara Travis [facility] 06/27/2016, 4:04 PM

## 2016-06-28 NOTE — Progress Notes (Signed)
D: Pt is seen in the milieu interacting appropriately this evening. He denies SI/HI/AVH at this time. Pt is aware of d/c in the morning. A: Emotional support and encouragement provided. Medications administered with education. q15 minute safety checks maintained. R: Pt remains free from harm. Will continue to monitor.

## 2016-06-28 NOTE — Progress Notes (Signed)
Pt d/c'd to bus stop with security. Pt belongings returned.

## 2016-07-04 ENCOUNTER — Other Ambulatory Visit: Payer: Self-pay

## 2016-07-04 ENCOUNTER — Encounter: Payer: Self-pay | Admitting: Emergency Medicine

## 2016-07-04 ENCOUNTER — Inpatient Hospital Stay
Admit: 2016-07-04 | Discharge: 2016-07-06 | DRG: 881 | Disposition: A | Discharge status: Home / Self Care | Payer: Medicaid Other | Attending: Psychiatry | Admitting: Psychiatry

## 2016-07-04 ENCOUNTER — Emergency Department
Admission: EM | Admit: 2016-07-04 | Discharge: 2016-07-04 | Disposition: A | Discharge status: Other Acute Inpatient Hospital | Payer: Medicaid Other | Attending: Student in an Organized Health Care Education/Training Program | Admitting: Student in an Organized Health Care Education/Training Program

## 2016-07-04 DIAGNOSIS — Z765 Malingerer [conscious simulation]: Secondary | ICD-10-CM

## 2016-07-04 DIAGNOSIS — F339 Major depressive disorder, recurrent, unspecified: Secondary | ICD-10-CM | POA: Diagnosis present

## 2016-07-04 DIAGNOSIS — F1410 Cocaine abuse, uncomplicated: Secondary | ICD-10-CM | POA: Diagnosis not present

## 2016-07-04 DIAGNOSIS — Z915 Personal history of self-harm: Secondary | ICD-10-CM | POA: Insufficient documentation

## 2016-07-04 DIAGNOSIS — Z79899 Other long term (current) drug therapy: Secondary | ICD-10-CM | POA: Insufficient documentation

## 2016-07-04 DIAGNOSIS — F603 Borderline personality disorder: Secondary | ICD-10-CM

## 2016-07-04 DIAGNOSIS — J45909 Unspecified asthma, uncomplicated: Secondary | ICD-10-CM | POA: Insufficient documentation

## 2016-07-04 DIAGNOSIS — F333 Major depressive disorder, recurrent, severe with psychotic symptoms: Secondary | ICD-10-CM | POA: Diagnosis not present

## 2016-07-04 DIAGNOSIS — F1290 Cannabis use, unspecified, uncomplicated: Secondary | ICD-10-CM | POA: Diagnosis present

## 2016-07-04 DIAGNOSIS — R45851 Suicidal ideations: Secondary | ICD-10-CM

## 2016-07-04 DIAGNOSIS — F329 Major depressive disorder, single episode, unspecified: Principal | ICD-10-CM | POA: Diagnosis present

## 2016-07-04 DIAGNOSIS — Z91410 Personal history of adult physical and sexual abuse: Secondary | ICD-10-CM | POA: Diagnosis not present

## 2016-07-04 DIAGNOSIS — Z9119 Patient's noncompliance with other medical treatment and regimen: Secondary | ICD-10-CM | POA: Diagnosis not present

## 2016-07-04 DIAGNOSIS — F17210 Nicotine dependence, cigarettes, uncomplicated: Secondary | ICD-10-CM | POA: Insufficient documentation

## 2016-07-04 DIAGNOSIS — G4700 Insomnia, unspecified: Secondary | ICD-10-CM | POA: Diagnosis present

## 2016-07-04 DIAGNOSIS — F17200 Nicotine dependence, unspecified, uncomplicated: Secondary | ICD-10-CM | POA: Diagnosis present

## 2016-07-04 DIAGNOSIS — F4310 Post-traumatic stress disorder, unspecified: Secondary | ICD-10-CM | POA: Diagnosis not present

## 2016-07-04 DIAGNOSIS — Z590 Homelessness: Secondary | ICD-10-CM

## 2016-07-04 DIAGNOSIS — Z818 Family history of other mental and behavioral disorders: Secondary | ICD-10-CM

## 2016-07-04 DIAGNOSIS — Z9114 Patient's other noncompliance with medication regimen: Secondary | ICD-10-CM | POA: Diagnosis not present

## 2016-07-04 DIAGNOSIS — F1220 Cannabis dependence, uncomplicated: Secondary | ICD-10-CM | POA: Diagnosis not present

## 2016-07-04 DIAGNOSIS — Z716 Tobacco abuse counseling: Secondary | ICD-10-CM

## 2016-07-04 DIAGNOSIS — F32A Depression, unspecified: Secondary | ICD-10-CM | POA: Diagnosis present

## 2016-07-04 LAB — CBC
HEMATOCRIT: 46 % (ref 40.0–52.0)
HEMOGLOBIN: 15.7 g/dL (ref 13.0–18.0)
MCH: 30 pg (ref 26.0–34.0)
MCHC: 34.2 g/dL (ref 32.0–36.0)
MCV: 87.5 fL (ref 80.0–100.0)
Platelets: 189 10*3/uL (ref 150–440)
RBC: 5.25 MIL/uL (ref 4.40–5.90)
RDW: 13.7 % (ref 11.5–14.5)
WBC: 10.7 10*3/uL — AB (ref 3.8–10.6)

## 2016-07-04 LAB — COMPREHENSIVE METABOLIC PANEL
ALT: 34 U/L (ref 17–63)
AST: 41 U/L (ref 15–41)
Albumin: 5.2 g/dL — ABNORMAL HIGH (ref 3.5–5.0)
Alkaline Phosphatase: 69 U/L (ref 38–126)
Anion gap: 10 (ref 5–15)
BUN: 18 mg/dL (ref 6–20)
CO2: 28 mmol/L (ref 22–32)
Calcium: 9.8 mg/dL (ref 8.9–10.3)
Chloride: 99 mmol/L — ABNORMAL LOW (ref 101–111)
Creatinine, Ser: 0.91 mg/dL (ref 0.61–1.24)
GFR calc Af Amer: >60 mL/min (ref >60)
GFR calc non Af Amer: >60 mL/min (ref >60)
Glucose, Bld: 103 mg/dL — ABNORMAL HIGH (ref 65–99)
Potassium: 4.1 mmol/L (ref 3.5–5.1)
Sodium: 137 mmol/L (ref 135–145)
Total Bilirubin: 0.9 mg/dL (ref 0.3–1.2)
Total Protein: 8.3 g/dL — ABNORMAL HIGH (ref 6.5–8.1)

## 2016-07-04 LAB — URINE DRUG SCREEN, QUALITATIVE (ARMC ONLY)
Amphetamines, Ur Screen: NOT DETECTED
Barbiturates, Ur Screen: NOT DETECTED
Benzodiazepine, Ur Scrn: NOT DETECTED
Cannabinoid 50 Ng, Ur ~~LOC~~: NOT DETECTED
Cocaine Metabolite,Ur ~~LOC~~: NOT DETECTED
MDMA (Ecstasy)Ur Screen: NOT DETECTED
Methadone Scn, Ur: NOT DETECTED
Opiate, Ur Screen: NOT DETECTED
Phencyclidine (PCP) Ur S: NOT DETECTED
Tricyclic, Ur Screen: POSITIVE — AB

## 2016-07-04 LAB — ACETAMINOPHEN LEVEL

## 2016-07-04 LAB — ETHANOL: Alcohol, Ethyl (B): <5 mg/dL (ref <5)

## 2016-07-04 LAB — SALICYLATE LEVEL: Salicylate Lvl: <4 mg/dL (ref 2.8–30.0)

## 2016-07-04 MED ORDER — FLUOXETINE HCL 10 MG PO CAPS
10.0000 mg | ORAL_CAPSULE | Freq: Every day | ORAL | Status: DC
  Administered 2016-07-05 – 2016-07-06 (×2): 10 mg via ORAL
  Filled 2016-07-04: qty 1

## 2016-07-04 MED ORDER — TRAZODONE HCL 50 MG PO TABS
150.0000 mg | ORAL_TABLET | Freq: Every day | ORAL | Status: DC
  Administered 2016-07-04: 150 mg via ORAL
  Filled 2016-07-04: qty 1

## 2016-07-04 MED ORDER — FLUOXETINE HCL 10 MG PO CAPS
10.0000 mg | ORAL_CAPSULE | Freq: Every day | ORAL | Status: DC
  Administered 2016-07-04: 10 mg via ORAL
  Filled 2016-07-04: qty 1

## 2016-07-04 MED ORDER — ALBUTEROL SULFATE HFA 108 (90 BASE) MCG/ACT IN AERS: 2.0000 | INHALATION_SPRAY | RESPIRATORY_TRACT | Status: DC | PRN

## 2016-07-04 MED ORDER — TRAZODONE HCL 50 MG PO TABS
150.0000 mg | ORAL_TABLET | Freq: Every day | ORAL | Status: DC
  Administered 2016-07-05: 150 mg via ORAL
  Filled 2016-07-04: qty 1

## 2016-07-04 MED ORDER — ALUM & MAG HYDROXIDE-SIMETH 200-200-20 MG/5ML PO SUSP: 30.0000 mL | ORAL | Status: DC | PRN

## 2016-07-04 MED ORDER — NICOTINE 21 MG/24HR TD PT24
21.0000 mg | MEDICATED_PATCH | Freq: Every day | TRANSDERMAL | Status: DC
  Administered 2016-07-05 – 2016-07-06 (×2): 21 mg via TRANSDERMAL
  Filled 2016-07-04: qty 1

## 2016-07-04 MED ORDER — MAGNESIUM HYDROXIDE 400 MG/5ML PO SUSP: 30.0000 mL | Freq: Every day | ORAL | Status: DC | PRN

## 2016-07-04 MED ORDER — ACETAMINOPHEN 325 MG PO TABS
650.0000 mg | ORAL_TABLET | Freq: Four times a day (QID) | ORAL | Status: DC | PRN
  Filled 2016-07-04: qty 2

## 2016-07-04 NOTE — ED Triage Notes (Signed)
Pt reports anxiety and depression about 30 mins PTA, reports SI, no plan. Pt calm and cooperative.

## 2016-07-04 NOTE — ED Provider Notes (Addendum)
[facility] Emergency Department Provider Note    First MD Initiated Contact with Patient 07/04/16 1628     (approximate)  I have reviewed the triage vital signs and the nursing notes.   HISTORY  Chief Complaint Suicidal    HPI [name] is a 25 y.o. male with history of anxiety depression and previous attempt at hanging as well as condos had presents with recurrent suicidal ideation after the death of his daughter. Patient states she currently plan on running out of her vagina. Denies any medication changes. Denies any hallucinations. He does admit to decreased sleep. Admits to heroin, cocaine and marijuana abuse but none today.   Past Medical History:  Diagnosis Date  . Anxiety   . Depression   . H/O suicide attempt    attempted hanging, gun to head, cut self  . Homelessness   . Suicide attempt [facility])     Patient Active Problem List   Diagnosis Date Noted  . Unspecified depressive disorder 06/26/2016  . Stimulant use disorder (HCC) (cocaine) 12/20/2015  . Opioid use disorder, severe, dependence (HCC) 12/20/2015  . Alcohol use disorder, moderate, dependence (HCC) 12/20/2015  . Tobacco use disorder 12/16/2015  . Cannabis use disorder, severe, dependence (HCC)   . Asthma 06/11/2012    Class: Chronic    Past Surgical History:  Procedure Laterality Date  . NO PAST SURGERIES      Prior to Admission medications   Medication Sig Start Date End Date Taking? Authorizing Provider  albuterol (PROVENTIL HFA;VENTOLIN HFA) 108 (90 Base) MCG/ACT inhaler Inhale 2 puffs into the lungs every 6 (six) hours as needed for wheezing or shortness of breath. 06/27/16   Jimmy Footman, MD  cyclobenzaprine (FLEXERIL) 5 MG tablet Take 1 tablet (5 mg total) by mouth 3 (three) times daily as needed for muscle spasms. 06/27/16   Jimmy Footman, MD  FLUoxetine (PROZAC) 10 MG capsule Take 1 capsule (10 mg total) by mouth daily. 06/27/16   Jimmy Footman, MD  traZODone (DESYREL) 150 MG tablet Take 1 tablet (150 mg total) by mouth at bedtime as needed for sleep. 06/27/16   Jimmy Footman, MD    Allergies Review of patient's allergies indicates no known allergies.  Family History  Problem Relation Age of Onset  . Drug abuse Mother   . Mental illness Mother     Social History Social History  Substance Use Topics  . Smoking status: Current Every Day Smoker    Packs/day: 1.50    Years: 13.00    Types: Cigarettes  . Smokeless tobacco: Never Used  . Alcohol use Yes     Comment: last use 1 week.     Review of Systems Patient denies headaches, rhinorrhea, blurry vision, numbness, shortness of breath, chest pain, edema, cough, abdominal pain, nausea, vomiting, diarrhea, dysuria, fevers, rashes or hallucinations unless otherwise stated above in HPI. ____________________________________________   PHYSICAL EXAM:  VITAL SIGNS: Vitals:   07/04/16 1555  BP: 126/62  Pulse: 97  Resp: 17  Temp: 98.5 F (36.9 C)    Constitutional: Alert and oriented. Well appearing and in no acute distress. Eyes: Conjunctivae are normal. PERRL. EOMI. Head: Atraumatic. Nose: No congestion/rhinnorhea. Mouth/Throat: Mucous membranes are moist.  Oropharynx non-erythematous. Neck: No stridor. Painless ROM. No cervical spine tenderness to palpation Hematological/Lymphatic/Immunilogical: No cervical lymphadenopathy. Cardiovascular: Normal rate, regular rhythm. Grossly normal heart sounds.  Good peripheral circulation. Respiratory: Normal respiratory effort.  No retractions. Lungs CTAB. Gastrointestinal: Soft and nontender. No distention. No abdominal  bruits. No CVA tenderness. Genitourinary:  Musculoskeletal: No lower extremity tenderness nor edema.  No joint effusions. Neurologic:  Normal speech and language. No gross focal neurologic deficits are appreciated. No gait instability. Skin:  Skin is warm, dry and intact. No rash  noted. Psychiatric: Mood and affect are normal. Speech and behavior are normal.  ____________________________________________   LABS (all labs ordered are listed, but only abnormal results are displayed)  Results for orders placed or performed during the hospital encounter of 07/04/16 (from the past 24 hour(s))  Comprehensive metabolic panel     Status: Abnormal   Collection Time: 07/04/16  3:57 PM  Result Value Ref Range   Sodium 137 135 - 145 mmol/L   Potassium 4.1 3.5 - 5.1 mmol/L   Chloride 99 (L) 101 - 111 mmol/L   CO2 28 22 - 32 mmol/L   Glucose, Bld 103 (H) 65 - 99 mg/dL   BUN 18 6 - 20 mg/dL   Creatinine, Ser 1.61 0.61 - 1.24 mg/dL   Calcium 9.8 8.9 - 09.6 mg/dL   Total Protein 8.3 (H) 6.5 - 8.1 g/dL   Albumin 5.2 (H) 3.5 - 5.0 g/dL   AST 41 15 - 41 U/L   ALT 34 17 - 63 U/L   Alkaline Phosphatase 69 38 - 126 U/L   Total Bilirubin 0.9 0.3 - 1.2 mg/dL   GFR calc non Af Amer >60 >60 mL/min   GFR calc Af Amer >60 >60 mL/min   Anion gap 10 5 - 15  Ethanol     Status: None   Collection Time: 07/04/16  3:57 PM  Result Value Ref Range   Alcohol, Ethyl (B) <5 <5 mg/dL  Salicylate level     Status: None   Collection Time: 07/04/16  3:57 PM  Result Value Ref Range   Salicylate Lvl <4.0 2.8 - 30.0 mg/dL  Acetaminophen level     Status: Abnormal   Collection Time: 07/04/16  3:57 PM  Result Value Ref Range   Acetaminophen (Tylenol), Serum <10 (L) 10 - 30 ug/mL  cbc     Status: Abnormal   Collection Time: 07/04/16  3:57 PM  Result Value Ref Range   WBC 10.7 (H) 3.8 - 10.6 K/uL   RBC 5.25 4.40 - 5.90 MIL/uL   Hemoglobin 15.7 13.0 - 18.0 g/dL   HCT 04.5 40.9 - 81.1 %   MCV 87.5 80.0 - 100.0 fL   MCH 30.0 26.0 - 34.0 pg   MCHC 34.2 32.0 - 36.0 g/dL   RDW 91.4 78.2 - 95.6 %   Platelets 189 150 - 440 K/uL   ____________________________________________ ____________________________________________   PROCEDURES  Procedure(s) performed: none    Critical Care performed:  no ____________________________________________   INITIAL IMPRESSION / ASSESSMENT AND PLAN / ED COURSE  Pertinent labs & imaging results that were available during my care of the patient were reviewed by me and considered in my medical decision making (see chart for details).  DDX: Psychosis, delirium, medication effect, noncompliance, polysubstance abuse, Si, Hi, depression   [name] is a 25 y.o. who presents to the ED with for evaluation of SI.  Patient has psych history of Suicide attempts.  Laboratory testing was ordered to evaluation for underlying electrolyte derangement or signs of underlying organic pathology to explain today's presentation.  Based on history and physical and laboratory evaluation, it appears that the patient's presentation is 2/2 underlying psychiatric disorder and will require further evaluation and management by inpatient psychiatry.  Patient  was  made an IVC due to SI.  Disposition pending psychiatric evaluation.   Clinical Course     ____________________________________________   FINAL CLINICAL IMPRESSION(S) / ED DIAGNOSES  Final diagnoses:  Suicidal ideation      NEW MEDICATIONS STARTED DURING THIS VISIT:  New Prescriptions   No medications on file     Note:  This document was prepared using Dragon voice recognition software and may include unintentional dictation errors.    Willy EddyPatrick Drayson Dorko, MD 07/04/16 1713    Willy EddyPatrick Samvel Zinn, MD 07/04/16 [phone]

## 2016-07-04 NOTE — BH Assessment (Signed)
[facility] by Dr. Toni Amendlapacs.  Attending Physician will be Dr. Ardyth HarpsHernandez.   Patient [facility] OtisGwen F.   Intake Paper Work has been signed and placed on patient chart.  ER staff is aware of the admission Delaney Meigs(Tamara, ER Sect.; Everardo PacificKenisha, Patient's Nurse & Lowanda FosterBrittany, Patient Access).
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
## 2016-07-04 NOTE — ED Notes (Addendum)
Psych MD at bedside

## 2016-07-04 NOTE — Consult Note (Signed)
[place] [facility] Consult   Reason for Consult:  Consult for 25 year old man who brought himself into the emergency room saying he was suicidal Referring Physician:  Quentin Cornwall Patient Identification: [name] MRN:  [id number] Principal Diagnosis: Major depression, recurrent, chronic ([place]) Diagnosis:   Patient Active Problem List   Diagnosis Date Noted  . 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 Opioid use disorder, severe, dependence ([place]) [F11.20] 12/20/2015  . Alcohol use disorder, moderate, dependence ([place]) [F10.20] 12/20/2015  . Tobacco use disorder [F17.200] 12/16/2015  . Major depression, recurrent, chronic ([place]) [F33.9] 11/20/2014  . Cannabis use disorder, severe, dependence ([place]) [F12.20]   . Asthma [J45.909] 06/11/2012    Class: Chronic    Total Time spent with patient: 1 hour  Subjective:   [name] is a 25 y.o. male patient admitted with "I'm suicidal".  HPI:  Patient interviewed. Chart reviewed. Labs and vitals reviewed. 25 year old man had himself brought to the emergency room today stating that he is suicidal. During the interview he tells me immediately that the reason he is suicidal is that his 42-year-old daughter died. He says that his 12-year-old daughter died 2 days ago when he was just told about it. He immediately developed intense suicidal ideation. He claims that his friend with whom he was living found him in the bathroom about to cut his neck. He hasn't slept in 2 days. He says he is still taking his medicine. He denies that he's been drinking or using any drugs. He claims that up until when this happened he had been in his improved state of mental health and had not been feeling depressed.  Social history: He says he was living with a "friend" for the last week. Doesn't specify exactly where. Not working. Had not yet gone to [place]. Patient claims that his 90-year-old daughter  died of a heart ailment just 2 days ago. He states that this happened in another county.  Medical history: Mild asthma. History of opiate abuse. History of some chronic recurrent pain.  Substance abuse history: Extensive history of drug and alcohol abuse including alcohol and opiates and cannabis  Past Psychiatric History: Multiple psychiatric hospitalizations. Multiple suicidal threats. Not clear that medication or treatment has ever really helped and he is not really compliant. Lots of substance abuse involved. Patient was just discharged from the hospital about a week ago on a low-dose of Prozac and trazodone as primary psychiatric medicine.  Risk to Self: Is patient at risk for suicide?: Yes Risk to Others:   Prior Inpatient Therapy:   Prior Outpatient Therapy:    Past Medical History:  Past Medical History:  Diagnosis Date  . Anxiety   . Depression   . H/O suicide attempt    attempted hanging, gun to head, cut self  . Homelessness   . Suicide attempt [facility])     Past Surgical History:  Procedure Laterality Date  . NO PAST SURGERIES     Family History:  Family History  Problem Relation Age of Onset  . Drug abuse Mother   . Mental illness Mother    Family Psychiatric  History: Patient claims there is depression in the family. Social History:  History  Alcohol Use  . Yes    Comment: last use 1 week.      History  Drug Use  . Types: Marijuana, Oxycodone, "Crack" cocaine, Heroin, IV, Cocaine    Comment: heroin -2 days ago cocaine-7 days  Social History   Social History  . Marital status: Divorced    Spouse name: N/A  . Number of children: N/A  . Years of education: N/A   Social History Main Topics  . Smoking status: Current Every Day Smoker    Packs/day: 1.50    Years: 13.00    Types: Cigarettes  . Smokeless tobacco: Never Used  . Alcohol use Yes     Comment: last use 1 week.   . Drug use:     Types: Marijuana, Oxycodone, "Crack" cocaine, Heroin, IV,  Cocaine     Comment: heroin -2 days ago cocaine-7 days  . Sexual activity: No   Other Topics Concern  . None   Social History Narrative  . None   Additional Social History:    Allergies:  No Known Allergies  Labs:  Results for orders placed or performed during the hospital encounter of 07/04/16 (from the past 48 hour(s))  Comprehensive metabolic panel     Status: Abnormal   Collection Time: 07/04/16  3:57 PM  Result Value Ref Range   Sodium 137 135 - 145 mmol/L   Potassium 4.1 3.5 - 5.1 mmol/L   Chloride 99 (L) 101 - 111 mmol/L   CO2 28 22 - 32 mmol/L   Glucose, Bld 103 (H) 65 - 99 mg/dL   BUN 18 6 - 20 mg/dL   Creatinine, Ser 0.91 0.61 - 1.24 mg/dL   Calcium 9.8 8.9 - 10.3 mg/dL   Total Protein 8.3 (H) 6.5 - 8.1 g/dL   Albumin 5.2 (H) 3.5 - 5.0 g/dL   AST 41 15 - 41 U/L   ALT 34 17 - 63 U/L   Alkaline Phosphatase 69 38 - 126 U/L   Total Bilirubin 0.9 0.3 - 1.2 mg/dL   GFR calc non Af Amer >60 >60 mL/min   GFR calc Af Amer >60 >60 mL/min    Comment: (NOTE) The eGFR has been calculated using the CKD EPI equation. This calculation has not been validated in all clinical situations. eGFR's persistently <60 mL/min signify possible Chronic Kidney Disease.    Anion gap 10 5 - 15  Ethanol     Status: None   Collection Time: 07/04/16  3:57 PM  Result Value Ref Range   Alcohol, Ethyl (B) <5 <5 mg/dL    Comment:        LOWEST DETECTABLE LIMIT FOR SERUM ALCOHOL IS 5 mg/dL FOR MEDICAL PURPOSES ONLY   Salicylate level     Status: None   Collection Time: 07/04/16  3:57 PM  Result Value Ref Range   Salicylate Lvl <3.8 2.8 - 30.0 mg/dL  Acetaminophen level     Status: Abnormal   Collection Time: 07/04/16  3:57 PM  Result Value Ref Range   Acetaminophen (Tylenol), Serum <10 (L) 10 - 30 ug/mL    Comment:        THERAPEUTIC CONCENTRATIONS VARY SIGNIFICANTLY. A RANGE OF 10-30 ug/mL MAY BE AN EFFECTIVE CONCENTRATION FOR MANY PATIENTS. HOWEVER, SOME ARE BEST TREATED AT  CONCENTRATIONS OUTSIDE THIS RANGE. ACETAMINOPHEN CONCENTRATIONS >150 ug/mL AT 4 HOURS AFTER INGESTION AND >50 ug/mL AT 12 HOURS AFTER INGESTION ARE OFTEN ASSOCIATED WITH TOXIC REACTIONS.   cbc     Status: Abnormal   Collection Time: 07/04/16  3:57 PM  Result Value Ref Range   WBC 10.7 (H) 3.8 - 10.6 K/uL   RBC 5.25 4.40 - 5.90 MIL/uL   Hemoglobin 15.7 13.0 - 18.0 g/dL   HCT 46.0 40.0 -  52.0 %   MCV 87.5 80.0 - 100.0 fL   MCH 30.0 26.0 - 34.0 pg   MCHC 34.2 32.0 - 36.0 g/dL   RDW 13.7 11.5 - 14.5 %   Platelets 189 150 - 440 K/uL    Current Facility-Administered Medications  Medication Dose Route Frequency Provider Last Rate Last Dose  . albuterol (PROVENTIL HFA;VENTOLIN HFA) 108 (90 Base) MCG/ACT inhaler 2 puff  2 puff Inhalation Q4H PRN Gonzella Lex, MD      . FLUoxetine (PROZAC) capsule 10 mg  10 mg Oral Daily Gonzella Lex, MD      . traZODone (DESYREL) tablet 150 mg  150 mg Oral QHS Gonzella Lex, MD       Current Outpatient Prescriptions  Medication Sig Dispense Refill  . albuterol (PROVENTIL HFA;VENTOLIN HFA) 108 (90 Base) MCG/ACT inhaler Inhale 2 puffs into the lungs every 6 (six) hours as needed for wheezing or shortness of breath. 1 Inhaler 0  . cyclobenzaprine (FLEXERIL) 5 MG tablet Take 1 tablet (5 mg total) by mouth 3 (three) times daily as needed for muscle spasms. 15 tablet 0  . FLUoxetine (PROZAC) 10 MG capsule Take 1 capsule (10 mg total) by mouth daily. 30 capsule 0  . traZODone (DESYREL) 150 MG tablet Take 1 tablet (150 mg total) by mouth at bedtime as needed for sleep. 30 tablet 0    Musculoskeletal: Strength & Muscle Tone: within normal limits Gait & Station: normal Patient leans: N/A  Psychiatric Specialty Exam: Physical Exam  Nursing note and vitals reviewed. Constitutional: He appears well-developed and well-nourished.  HENT:  Head: Normocephalic and atraumatic.  Eyes: Conjunctivae are normal. Pupils are equal, round, and reactive to light.   Neck: Normal range of motion.  Cardiovascular: Regular rhythm and normal heart sounds.   Respiratory: Effort normal. No respiratory distress.  GI: Soft.  Musculoskeletal: Normal range of motion.  Neurological: He is alert.  Skin: Skin is warm and dry.  Psychiatric: His speech is delayed. He is slowed. Cognition and memory are normal. He expresses impulsivity. He exhibits a depressed mood. He expresses suicidal ideation. He expresses suicidal plans.    Review of Systems  Constitutional: Negative.   HENT: Negative.   Eyes: Negative.   Respiratory: Negative.   Cardiovascular: Negative.   Gastrointestinal: Negative.   Musculoskeletal: Negative.   Skin: Negative.   Neurological: Negative.   Psychiatric/Behavioral: Positive for depression and suicidal ideas. Negative for hallucinations, memory loss and substance abuse. The patient is nervous/anxious and has insomnia.     Blood pressure 126/62, pulse 97, temperature 98.5 F (36.9 C), temperature source Oral, resp. rate 17, height 5' 5"  (1.651 m), weight 78.5 kg (173 lb), SpO2 100 %.Body mass index is 28.79 kg/m.  General Appearance: Fairly Groomed  Eye Contact:  Fair  Speech:  Normal Rate  Volume:  Normal  Mood:  Depressed  Affect:  Non-Congruent  Thought Process:  Goal Directed  Orientation:  Full (Time, Place, and Person)  Thought Content:  Logical  Suicidal Thoughts:  Yes.  with intent/plan  Homicidal Thoughts:  No  Memory:  Immediate;   Good Recent;   Fair Remote;   Fair  Judgement:  Fair  Insight:  Fair  Psychomotor Activity:  Normal  Concentration:  Concentration: Fair  Recall:  [employer] of Knowledge:  Fair  Language:  Fair  Akathisia:  No  Handed:  Right  AIMS (if indicated):     Assets:  Communication Skills Physical Health Resilience  ADL's:  Intact  Cognition:  WNL  Sleep:        Treatment Plan Summary: Daily contact with patient to assess and evaluate symptoms and progress in treatment, Medication  management and Plan 25 year old man with chronic mental health problems chronic behavior problems crawling substance abuse problems come into the hospital today claiming that he is definitely going to kill himself because his 9-year-old daughter died a couple days ago. I certainly didn't express any doubt directly to his face but there is something about the story that sounds convenient at the least and fishy at most. Nevertheless patient is offered sympathy and we will admit him back to the psychiatric ward. Continue medications he was on when he was discharged previously. Drug screen is not back yet but he claims that he hasn't been using.  Disposition: Recommend psychiatric Inpatient admission when medically cleared. Supportive therapy provided about ongoing stressors.  Alethia Berthold, MD 07/04/2016 5:18 PM

## 2016-07-04 NOTE — ED Notes (Signed)
Dinner tray given to pt at this time.

## 2016-07-04 NOTE — ED Notes (Signed)
Preparing patient for transfer 

## 2016-07-04 NOTE — ED Notes (Signed)
Patient is brusque and irritable.  Reports SI with no plan because his "daughter has died".  Stories incongruent with previous assessments.  States he has gone for post hospitilization f/u and has been taking his psychiatric medications.

## 2016-07-05 ENCOUNTER — Encounter: Payer: Self-pay | Admitting: Psychiatry

## 2016-07-05 DIAGNOSIS — F329 Major depressive disorder, single episode, unspecified: Principal | ICD-10-CM

## 2016-07-05 DIAGNOSIS — Z765 Malingerer [conscious simulation]: Secondary | ICD-10-CM

## 2016-07-05 NOTE — [facility] Group Notes (Signed)
[facility] LCSW Group Therapy   07/05/2016 9:30 am   Type of Therapy: Group Therapy   Participation Level: Active   Participation Quality: Attentive, Sharing and Supportive   Affect: Appropriate   Cognitive: Alert and Oriented   Insight: Developing/Improving and Engaged   Engagement in Therapy: Developing/Improving and Engaged   Modes of Intervention: Clarification, Confrontation, Discussion, Education, Exploration, Limit-setting, Orientation, Problem-solving, Rapport Building, Dance movement psychotherapisteality Testing, Socialization and Support   Summary of Progress/Problems: The topic for group was balance in life. Today's group focused on defining balance in one's own words, identifying things that can knock one off balance, and exploring healthy ways to maintain balance in life. Group members were asked to provide an example of a time when they felt off balance, describe how they handled that situation, and process healthier ways to regain balance in the future. Group members were asked to share the most important tool for maintaining balance that they learned [facility] and how they plan to apply this method after discharge.   Pt defined balance as positive and negative attributes in one's life. He stated that his life becomes unbalanced when there is a lack of support in family ties and substance abuse. Pt identified joining a church community and finding healthy relationships as a way to achieve balance.  [name] AbbotKadijah Wylodean Shimmel, MSW, LCSW-A 07/05/2016, 11:22AM

## 2016-07-05 NOTE — Tx Team (Signed)
Initial Treatment Plan 07/05/2016 12:14 AM [name] ZOX:[id number]    PATIENT STRESSORS: Loss of "25 years old daughter" Medication change or noncompliance   PATIENT STRENGTHS: Ability for insight Capable of independent living Motivation for treatment/growth   PATIENT IDENTIFIED PROBLEMS: Suicidal Thoughts  Depressive Symptoms  Grief of his "25 years old daughter's death."                 DISCHARGE CRITERIA:  Improved stabilization in mood, thinking, and/or behavior Motivation to continue treatment in a less acute level of care Reduction of life-threatening or endangering symptoms to within safe limits  PRELIMINARY DISCHARGE PLAN: Placement in alternative living arrangements  PATIENT/FAMILY INVOLVEMENT: This treatment plan has been presented to and reviewed with the patient, [name].  The patient have been given the opportunity to ask questions and make suggestions.  Cleotis NipperAbiodun T Angelamarie Avakian, RN 07/05/2016, 12:14 AM

## 2016-07-05 NOTE — [facility] Group Notes (Signed)
[facility] Group Notes:  (Nursing/MHT/Case Management/Adjunct)  Date:  07/05/2016  Time:  4:33 PM  Type of Therapy:  Psychoeducational Skills  Participation Level:  Minimal  Participation Quality:  Appropriate  Affect:  Appropriate  Cognitive:  Appropriate  Insight:  Appropriate  Engagement in Group:  Engaged  Modes of Intervention:  Discussion, Education and Support  Summary of Progress/Problems:  Lynelle SmokeCara Travis Taryn Shellhammer 07/05/2016, 4:33 PM
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
## 2016-07-05 NOTE — Progress Notes (Signed)
Staff informed Clinical research associatewriter patient is repeatedly verbally inappropriate with staff, requests social worker's phone number. Inappropriate with nursing students making comments as they walk by. Inappropriate with patients in dayroom stating "lets have a therapy group in my room". Limits set by staff, patient with fair comprehension.

## 2016-07-05 NOTE — Social Work (Signed)
CSW contacted [facility] intake coordinator, Deloris Lafayette DragonCarr @[phone]  and [facility] coordinator, Nelma RothmanSandra Reiman  @ [phone] to coordinate discharge plans. CSW sent referral and needed documents to [facility] and is awaiting approval. Pt agreeable to [facility]T as long as [place] can [place]   Hampton AbbotKadijah Wiley Magan, MSW, LCSW-A 07/05/2016, 12:41PM

## 2016-07-05 NOTE — [facility] Suicide Risk Assessment (Signed)
[facility]   Nursing information obtained from:  Patient, Review of record Demographic factors:  Male, Adolescent or young adult, Caucasian, Low socioeconomic status, Unemployed Current Mental Status:  Suicide plan, Intention to act on suicide plan, Belief that plan would result in death Loss Factors:  Loss of significant relationship (864 year old daughter died, was informed by ex-GF) Historical Factors:  Prior suicide attempts, Impulsivity Risk Reduction Factors:  NA  Total Time spent with patient:1 h Principal Problem: Depression Diagnosis:   Patient Active Problem List   Diagnosis Date Noted  . Malingering [Z76.5] 07/05/2016  . 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 Stimulant use disorder (HCC) (cocaine) [F15.90] 12/20/2015  . Opioid use disorder, severe, dependence (HCC) [F11.20] 12/20/2015  . Alcohol use disorder, moderate, dependence ([facility]CC) [F10.20] 12/20/2015  . Tobacco use disorder [F17.200] 12/16/2015  . Cannabis use disorder, severe, dependence ([facility]) [F12.20]   . Asthma [J45.909] 06/11/2012    Class: Chronic   Subjective Data:   Continued Clinical Symptoms:  Alcohol Use Disorder Identification Test Final Score (AUDIT): 1 The "Alcohol Use Disorders Identification Test", Guidelines for Use in Primary Care, Second Edition.  World Science writerHealth Organization [facility](WHO). Score between 0-7:  no or low risk or alcohol related problems. Score between 8-15:  moderate risk of alcohol related problems. Score between 16-19:  high risk of alcohol related problems. Score 20 or above:  warrants further diagnostic evaluation for alcohol dependence and treatment.   CLINICAL FACTORS:   Depression:   Comorbid alcohol abuse/dependence Alcohol/Substance Abuse/Dependencies Personality Disorders:   Cluster B Previous Psychiatric Diagnoses and Treatments    Psychiatric Specialty Exam: Physical Exam  ROS  Blood pressure 125/76, pulse 70, temperature 97.9 F  (36.6 C), temperature source Oral, resp. rate 18, height 5\' 5"  (1.651 m), weight 78.5 kg (173 lb).Body mass index is 28.79 kg/m.                                                    Sleep:  Number of Hours: 5.3      COGNITIVE FEATURES THAT CONTRIBUTE TO RISK:  None    SUICIDE RISK:   Mild:  Suicidal ideation of limited frequency, intensity, duration, and specificity.  There are no identifiable plans, no associated intent, mild dysphoria and related symptoms, good self-control (both objective and subjective assessment), few other risk factors, and identifiable protective factors, including available and accessible social support.   [facility]  I certify that inpatient services furnished can reasonably be expected to improve the patient's condition.  Jimmy FootmanHernandez-Gonzalez,  Caliah Kopke, MD 07/05/2016, 4:42 PM
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
## 2016-07-05 NOTE — [facility] Suicide Risk Assessment (Deleted)
[facility]   Nursing information obtained from:  Patient, Review of record Demographic factors:  Male, Adolescent or young adult, Caucasian, Low socioeconomic status, Unemployed Current Mental Status:  Suicide plan, Intention to act on suicide plan, Belief that plan would result in death Loss Factors:  Loss of significant relationship (25 year old daughter died, was informed by ex-GF) Historical Factors:  Prior suicide attempts, Impulsivity Risk Reduction Factors:  NA  Total Time spent with patient: 1 hour Principal Problem: Depression Diagnosis:   Patient Active Problem List   Diagnosis Date Noted  . Malingering [Z76.5] 07/05/2016  . 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 Stimulant use disorder (HCC) (cocaine) [F15.90] 12/20/2015  . Opioid use disorder, severe, dependence (HCC) [F11.20] 12/20/2015  . Alcohol use disorder, moderate, dependence (HCC) [F10.20] 12/20/2015  . Tobacco use disorder [F17.200] 12/16/2015  . Cannabis use disorder, severe, dependence (HCC) [F12.20]   . Asthma [J45.909] 06/11/2012    Class: Chronic   Subjective Data:   Continued Clinical Symptoms:  Alcohol Use Disorder Identification Test Final Score (AUDIT): 1 The "Alcohol Use Disorders Identification Test", Guidelines for Use in Primary Care, Second Edition.  World Science writerHealth Organization [facility](WHO). Score between 0-7:  no or low risk or alcohol related problems. Score between 8-15:  moderate risk of alcohol related problems. Score between 16-19:  high risk of alcohol related problems. Score 20 or above:  warrants further diagnostic evaluation for alcohol dependence and treatment.   CLINICAL FACTORS:   Depression:   Comorbid alcohol abuse/dependence Impulsivity Alcohol/Substance Abuse/Dependencies Previous Psychiatric Diagnoses and Treatments    Psychiatric Specialty Exam: Physical Exam   ROS   Blood pressure 125/76, pulse 70, temperature 97.9 F (36.6 C),  temperature source Oral, resp. rate 18, height 5\' 5"  (1.651 m), weight 78.5 kg (173 lb).Body mass index is 28.79 kg/m.                                                    Sleep:  Number of Hours: 5.3      COGNITIVE FEATURES THAT CONTRIBUTE TO RISK:  None    SUICIDE RISK:   Moderate:  Frequent suicidal ideation with limited intensity, and duration, some specificity in terms of plans, no associated intent, good self-control, limited dysphoria/symptomatology, some risk factors present, and identifiable protective factors, including available and accessible social support.   [facility]  I certify that inpatient services furnished can reasonably be expected to improve the patient's condition.  Jimmy FootmanHernandez-Gonzalez,  Maxcine Strong, MD 07/05/2016, 4:41 PM

## 2016-07-05 NOTE — Plan of Care (Signed)
Problem: Education: Goal: Knowledge of [place] [facility] Education information/materials will improve Outcome: Progressing Patient educated on current recommended plan of care with limited comprehension.

## 2016-07-05 NOTE — Care Management (Signed)
Chaplain spoke with patient and let him know that if he desired to talk that our services are offered 24 hours and we can come immediately.

## 2016-07-05 NOTE — B[facility] Suicide Risk Assessment (Signed)
[facility] INPATIENT:  Family/Significant Other Suicide Prevention Education  Suicide Prevention Education:  Patient Refusal for Family/Significant Other Suicide Prevention Education: The patient [name] has refused to provide written consent for family/significant other to be provided Family/Significant Other Suicide Prevention Education during admission and/or prior to discharge.  Physician notified.  Lynden OxfordKadijah R Lallie Strahm, MSW, LCSW-A 07/05/2016, 2:26 PM

## 2016-07-06 ENCOUNTER — Emergency Department (HOSPITAL_COMMUNITY)
Admission: EM | Admit: 2016-07-06 | Discharge: 2016-07-09 | Disposition: A | Discharge status: Other Acute Inpatient Hospital | Payer: Medicaid Other | Attending: Emergency Medicine | Admitting: Emergency Medicine

## 2016-07-06 ENCOUNTER — Encounter (HOSPITAL_COMMUNITY): Payer: Self-pay | Admitting: *Deleted

## 2016-07-06 DIAGNOSIS — F333 Major depressive disorder, recurrent, severe with psychotic symptoms: Secondary | ICD-10-CM | POA: Diagnosis not present

## 2016-07-06 DIAGNOSIS — R45851 Suicidal ideations: Secondary | ICD-10-CM | POA: Insufficient documentation

## 2016-07-06 DIAGNOSIS — J45909 Unspecified asthma, uncomplicated: Secondary | ICD-10-CM | POA: Insufficient documentation

## 2016-07-06 DIAGNOSIS — F419 Anxiety disorder, unspecified: Secondary | ICD-10-CM | POA: Insufficient documentation

## 2016-07-06 DIAGNOSIS — F603 Borderline personality disorder: Secondary | ICD-10-CM

## 2016-07-06 DIAGNOSIS — F17210 Nicotine dependence, cigarettes, uncomplicated: Secondary | ICD-10-CM | POA: Insufficient documentation

## 2016-07-06 DIAGNOSIS — Z79899 Other long term (current) drug therapy: Secondary | ICD-10-CM | POA: Insufficient documentation

## 2016-07-06 LAB — COMPREHENSIVE METABOLIC PANEL
ALT: 33 U/L (ref 17–63)
ANION GAP: 11 (ref 5–15)
AST: 34 U/L (ref 15–41)
Albumin: 4.5 g/dL (ref 3.5–5.0)
Alkaline Phosphatase: 62 U/L (ref 38–126)
BUN: 12 mg/dL (ref 6–20)
CHLORIDE: 100 mmol/L — AB (ref 101–111)
CO2: 26 mmol/L (ref 22–32)
Calcium: 9.9 mg/dL (ref 8.9–10.3)
Creatinine, Ser: 0.73 mg/dL (ref 0.61–1.24)
Glucose, Bld: 150 mg/dL — ABNORMAL HIGH (ref 65–99)
POTASSIUM: 3.7 mmol/L (ref 3.5–5.1)
SODIUM: 137 mmol/L (ref 135–145)
Total Bilirubin: 0.3 mg/dL (ref 0.3–1.2)
Total Protein: 7.2 g/dL (ref 6.5–8.1)

## 2016-07-06 LAB — ETHANOL: ALCOHOL ETHYL (B): 35 mg/dL — AB (ref <5)

## 2016-07-06 LAB — RAPID URINE DRUG SCREEN, HOSP PERFORMED
AMPHETAMINES: NOT DETECTED
Barbiturates: NOT DETECTED
Benzodiazepines: NOT DETECTED
COCAINE: NOT DETECTED
OPIATES: NOT DETECTED
TETRAHYDROCANNABINOL: NOT DETECTED

## 2016-07-06 LAB — CBC
HCT: 44.7 % (ref 39.0–52.0)
HEMOGLOBIN: 14.4 g/dL (ref 13.0–17.0)
MCH: 29.1 pg (ref 26.0–34.0)
MCHC: 32.2 g/dL (ref 30.0–36.0)
MCV: 90.3 fL (ref 78.0–100.0)
PLATELETS: 197 10*3/uL (ref 150–400)
RBC: 4.95 MIL/uL (ref 4.22–5.81)
RDW: 13 % (ref 11.5–15.5)
WBC: 7.8 10*3/uL (ref 4.0–10.5)

## 2016-07-06 LAB — SALICYLATE LEVEL

## 2016-07-06 LAB — ACETAMINOPHEN LEVEL

## 2016-07-06 MED ORDER — LORAZEPAM 1 MG PO TABS
1.0000 mg | ORAL_TABLET | Freq: Three times a day (TID) | ORAL | Status: DC | PRN
  Administered 2016-07-07 – 2016-07-09 (×3): 1 mg via ORAL
  Filled 2016-07-06 (×3): qty 1

## 2016-07-06 MED ORDER — TRAZODONE HCL 50 MG PO TABS
50.0000 mg | ORAL_TABLET | Freq: Every day | ORAL | Status: DC
  Administered 2016-07-06 – 2016-07-08 (×3): 50 mg via ORAL
  Filled 2016-07-06 (×3): qty 1

## 2016-07-06 MED ORDER — ONDANSETRON HCL 4 MG PO TABS
4.0000 mg | ORAL_TABLET | Freq: Three times a day (TID) | ORAL | Status: DC | PRN
  Administered 2016-07-07 – 2016-07-08 (×3): 4 mg via ORAL
  Filled 2016-07-06 (×3): qty 1

## 2016-07-06 MED ORDER — GABAPENTIN 300 MG PO CAPS
600.0000 mg | ORAL_CAPSULE | Freq: Three times a day (TID) | ORAL | Status: DC
  Administered 2016-07-06 – 2016-07-09 (×8): 600 mg via ORAL
  Filled 2016-07-06 (×8): qty 2

## 2016-07-06 MED ORDER — FLUOXETINE HCL 10 MG PO CAPS
10.0000 mg | ORAL_CAPSULE | Freq: Every day | ORAL | Status: DC
  Administered 2016-07-06 – 2016-07-09 (×4): 10 mg via ORAL
  Filled 2016-07-06 (×5): qty 1

## 2016-07-06 NOTE — [facility] Suicide Risk Assessment (Signed)
[facility]   Principal Problem: Depression Discharge Diagnoses:  Patient Active Problem List   Diagnosis Date Noted  . Malingering [Z76.5] 07/05/2016  . 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 Tobacco use disorder [F17.200] 12/16/2015  . Cannabis use disorder, severe, dependence (HCC) [F12.20]   . Asthma [J45.909] 06/11/2012    Class: Chronic     Psychiatric Specialty Exam: ROS  Blood pressure 111/77, pulse 71, temperature 98.1 F (36.7 C), temperature source Oral, resp. rate 18, height 5\' 5"  (1.651 m), weight 78.5 kg (173 lb).Body mass index is 28.79 kg/m.                                                       Mental Status Per Nursing Assessment::   On Admission:  Suicide plan, Intention to act on suicide plan, Belief that plan would result in death  Demographic Factors:  Male, Adolescent or young adult, Caucasian, Low socioeconomic status and Unemployed  Loss Factors: Financial problems/change in socioeconomic status  Historical Factors: Impulsivity  Risk Reduction Factors:   Living with another person, especially a relative No access to guns  Continued Clinical Symptoms:  Alcohol/Substance Abuse/Dependencies Previous Psychiatric Diagnoses and Treatments  Cognitive Features That Contribute To Risk:  None    Suicide Risk:  Minimal: No identifiable suicidal ideation.  Patients presenting with no risk factors but with morbid ruminations; may be classified as minimal risk based on the severity of the depressive symptoms    Jimmy FootmanHernandez-Gonzalez,  Dustina Scoggin, MD 07/06/2016, 9:23 AM

## 2016-07-06 NOTE — Progress Notes (Signed)
  [facility] Discharge [facility] :  Will you be returning to the same living situation after discharge:  No. At discharge, do you have transportation home?: Yes,  [facility] Do you have the ability to pay for your medications: Yes,  Medicaid  Release of information consent forms completed and in the chart;  Patient's signature needed at discharge.  Patient to Follow up at: Follow-up Information    [facility]. Go on 07/11/2016.   Why:  Please [facility] to meet Lafonda MossesDiana for your initial intake assessment at 10:00 oclock AM. It is important to arrive 15 minutes early for prompt services. Bring Medicaid card and discharge paperwork. Contact information: [address] [place], [place] [address] Phone: [phone] Fax: [phone]       [facility]. Go on 07/09/2016.   Why:  [facility] for medication management and therapy. It is important that you bring your discharge packet to this follow-up appointment. Contact information: [address] [place], [place] [address] Phone #: [phone] Fax #: [phone]           Next level of care provider has [facility] Link:no  Safety Planning and Suicide Prevention discussed: Yes,  SPE reviewed with patient  Have you used any form of tobacco in the last 30 days? (Cigarettes, Smokeless Tobacco, Cigars, and/or Pipes): Yes  Has patient been referred to the Quitline?: Patient refused referral  Patient has been referred for addiction treatment: Pt. refused referral  Lynden OxfordKadijah R Natonya [name], MSW, LCSW-A 07/06/2016, 11:41 AM
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
## 2016-07-06 NOTE — ED Notes (Signed)
Pt is in burgundy scrubs, sitter at bedside, valuables secured and labelled, and has been wanded by security.
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
## 2016-07-06 NOTE — ED Notes (Signed)
Belongings inventoried and secured.

## 2016-07-06 NOTE — BH Assessment (Signed)
Referrals sent to the following for review: [facility], [facility], [facility], [place], [address], [facility], [facility], [facility], [facility].  Princess BruinsAquicha Duff, MSW, Theresia MajorsLCSWA

## 2016-07-06 NOTE — Progress Notes (Signed)
D: Pt denies SI/HI/AVH. Pt is irritable and inappropriate with staff ; making sexual and explicit comments and using derogatory terms. Patient was often redirected to refrain from using such words but he ignored warnings. . Patient's affect is blunted, mood is hostile towards the staff. Patient is not interacting with peers and staff appropriately.  A: Pt was offered support and encouragement. Pt was given scheduled medications. Pt was encouraged to attend groups. Q 15 minute checks were done for safety.  R:Pt attends groups and does not interact with peers and staff appropriately. Pt is compliant with medication. Pt is not receptive to treatment. Safety maintained on unit will continue to monitor.

## 2016-07-06 NOTE — Progress Notes (Signed)
Affect blunted.  Denies SI/HI/AVH. Discharge instructions given.  Verbalized understanding.  Personal belongings returned.  Escorted off unit by this Clinical research associatewriter to doctors on call parking lot to meet courtesy car to be transported to bus stop.
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
## 2016-07-06 NOTE — [facility] Group Notes (Signed)
[facility] Group Notes:  (Nursing/MHT/Case Management/Adjunct)  Date:  07/06/2016  Time:  2:59 AM  Type of Therapy:  Group Therapy  Participation Level:  Active  Participation Quality:  Resistant  Affect:  Defensive  Cognitive:  Alert  Insight:  Lacking  Engagement in Group:  Resistant  Modes of Intervention:  n/a  Summary of Progress/Problems:  Veva Holesshley Imani Brylan Dec 07/06/2016, 2:59 AM

## 2016-07-06 NOTE — Tx Team (Signed)
[name]  Pt name [id number]    Depression  Principal Problem Principal Problem:   Unspecified depressive disorder Active Problems:   Asthma   Cannabis use disorder, severe, dependence (HCC)   Tobacco use disorder   Malingering  Secondary Dx/Hospital Problem List Current Facility-Administered Medications  Medication Dose Route Frequency Provider Last Rate Last Dose  . acetaminophen (TYLENOL) tablet 650 mg  650 mg Oral Q6H PRN Audery Amel, MD      . albuterol (PROVENTIL HFA;VENTOLIN HFA) 108 (90 Base) MCG/ACT inhaler 2 puff  2 puff Inhalation Q4H PRN Audery Amel, MD      . alum & mag hydroxide-simeth (MAALOX/MYLANTA) 200-200-20 MG/5ML suspension 30 mL  30 mL Oral Q4H PRN Audery Amel, MD      . FLUoxetine (PROZAC) capsule 10 mg  10 mg Oral Daily Audery Amel, MD   10 mg at 07/06/16 0849  . magnesium hydroxide (MILK OF MAGNESIA) suspension 30 mL  30 mL Oral Daily PRN Audery Amel, MD      . nicotine (NICODERM CQ - dosed in mg/24 hours) patch 21 mg  21 mg Transdermal Daily Jimmy Footman, MD   21 mg at 07/06/16 0651  . traZODone (DESYREL) tablet 150 mg  150 mg Oral QHS Audery Amel, MD   150 mg at 07/05/16 2140     Current Meds Prescriptions Prior to Admission  Medication Sig Dispense Refill Last Dose  . albuterol (PROVENTIL HFA;VENTOLIN HFA) 108 (90 Base) MCG/ACT inhaler Inhale 2 puffs into the lungs every 6 (six) hours as needed for wheezing or shortness of breath. 1 Inhaler 0 prn at prn  . cyclobenzaprine (FLEXERIL) 5 MG tablet Take 1 tablet (5 mg total) by mouth 3 (three) times daily as needed for muscle spasms. 15 tablet 0 prn at prn  . FLUoxetine (PROZAC) 10 MG capsule Take 1 capsule (10 mg total) by mouth daily. 30 capsule 0 07/04/2016 at Unknown time  . traZODone (DESYREL) 150 MG tablet Take 1 tablet (150 mg total) by mouth at bedtime as needed for sleep. 30 tablet 0 07/03/2016 at Unknown time     Prior to Admission Meds   Interdisciplinary Treatment  and Diagnostic Plan Update  07/06/2016 Time of Session: 11:04 AM  [name] MRN: [id number]  Principal Diagnosis: Depression  Secondary Diagnoses: Principal Problem:   Unspecified depressive disorder Active Problems:   Asthma   Cannabis use disorder, severe, dependence (HCC)   Tobacco use disorder   Malingering   Current Medications:  Current Facility-Administered Medications  Medication Dose Route Frequency Provider Last Rate Last Dose  . acetaminophen (TYLENOL) tablet 650 mg  650 mg Oral Q6H PRN Audery Amel, MD      . albuterol (PROVENTIL HFA;VENTOLIN HFA) 108 (90 Base) MCG/ACT inhaler 2 puff  2 puff Inhalation Q4H PRN Audery Amel, MD      . alum & mag hydroxide-simeth (MAALOX/MYLANTA) 200-200-20 MG/5ML suspension 30 mL  30 mL Oral Q4H PRN Audery Amel, MD      . FLUoxetine (PROZAC) capsule 10 mg  10 mg Oral Daily Audery Amel, MD   10 mg at 07/06/16 0849  . magnesium hydroxide (MILK OF MAGNESIA) suspension 30 mL  30 mL Oral Daily PRN Audery Amel, MD      . nicotine (NICODERM CQ - dosed in mg/24 hours) patch 21 mg  21 mg Transdermal Daily Jimmy Footman, MD   21 mg at 07/06/16 0651  . traZODone (DESYREL) tablet  150 mg  150 mg Oral QHS Audery Amel, MD   150 mg at 07/05/16 2140    PTA Medications: Prescriptions Prior to Admission  Medication Sig Dispense Refill Last Dose  . albuterol (PROVENTIL HFA;VENTOLIN HFA) 108 (90 Base) MCG/ACT inhaler Inhale 2 puffs into the lungs every 6 (six) hours as needed for wheezing or shortness of breath. 1 Inhaler 0 prn at prn  . cyclobenzaprine (FLEXERIL) 5 MG tablet Take 1 tablet (5 mg total) by mouth 3 (three) times daily as needed for muscle spasms. 15 tablet 0 prn at prn  . FLUoxetine (PROZAC) 10 MG capsule Take 1 capsule (10 mg total) by mouth daily. 30 capsule 0 07/04/2016 at Unknown time  . traZODone (DESYREL) 150 MG tablet Take 1 tablet (150 mg total) by mouth at bedtime as needed for sleep. 30 tablet 0  07/03/2016 at Unknown time    Treatment Modalities: Medication Management, Group therapy, Case management,  1 to 1 session with clinician, Psychoeducation, Recreational therapy.   Physician Treatment Plan for Primary Diagnosis: Depression Long Term Goal(s): Improvement in symptoms so as ready for discharge  Short Term Goals: Ability to verbalize feelings will improve, Ability to demonstrate self-control will improve and Ability to identify and develop effective coping behaviors will improve  Medication Management: Evaluate patient's response, side effects, and tolerance of medication regimen.  Therapeutic Interventions: 1 to 1 sessions, Unit Group sessions and Medication administration.  Evaluation of Outcomes: Adequate for discharge   Physician Treatment Plan for Secondary Diagnosis: Principal Problem:   Unspecified depressive disorder Active Problems:   Asthma   Cannabis use disorder, severe, dependence (HCC)   Tobacco use disorder   Malingering   Long Term Goal(s): Improvement in symptoms so as ready for discharge  Short Term Goals: Ability to identify changes in lifestyle to reduce recurrence of condition will improve, Ability to demonstrate self-control will improve and Ability to identify triggers associated with substance abuse/mental health issues will improve  Medication Management: Evaluate patient's response, side effects, and tolerance of medication regimen.  Therapeutic Interventions: 1 to 1 sessions, Unit Group sessions and Medication administration.  Evaluation of Outcomes: Adequate for Discharge   RN Treatment Plan for Primary Diagnosis: Depression Long Term Goal(s): Knowledge of disease and therapeutic regimen to maintain health will improve  Short Term Goals: Ability to remain free from injury will improve and Ability to participate in decision making will improve  Medication Management: RN will administer medications as ordered by provider, will assess and  evaluate patient's response and provide education to patient for prescribed medication. RN will report any adverse and/or side effects to prescribing provider.  Therapeutic Interventions: 1 on 1 counseling sessions, Psychoeducation, Medication administration, Evaluate responses to treatment, Monitor vital signs and CBGs as ordered, Perform/monitor CIWA, COWS, AIMS and Fall Risk screenings as ordered, Perform wound care treatments as ordered.  Evaluation of Outcomes: Adequate for Discharge   LCSW Treatment Plan for Primary Diagnosis: Depression Long Term Goal(s): Safe transition to appropriate next level of care at discharge, Engage patient in therapeutic group addressing interpersonal concerns.  Short Term Goals: Engage patient in aftercare planning with referrals and resources, Increase social support, Facilitate acceptance of mental health diagnosis and concerns and Increase skills for wellness and recovery  Therapeutic Interventions: Assess for all discharge needs, 1 to 1 time with Social worker, Explore available resources and support systems, Assess for adequacy in community support network, Educate family and significant other(s) on suicide prevention, Complete Psychosocial Assessment, Interpersonal group therapy.  Evaluation of Outcomes: Adequate for Discharge   Progress in Treatment: Attending groups: Yes Participating in groups: Yes Taking medication as prescribed: Yes, MD continues to assess for medication changes as needed Toleration medication: Yes, no side effects reported at this time Family/Significant other contact made:  Patient understands diagnosis:  Discussing patient identified problems/goals with staff: Yes Medical problems stabilized or resolved: Yes Denies suicidal/homicidal ideation:  Issues/concerns per patient self-inventory: None Other: N/A  New problem(s) identified: None identified at this time.   New Short Term/Long Term Goal(s): None identified at this  time.   Discharge Plan or Barriers:   Reason for Continuation of Hospitalization: Anxiety Delusions  Depression Hallucinations Homicidal ideation [place] Medical Issues Medication stabilization Suicidal ideation Withdrawal symptoms  Estimated Length of Stay: 3-5 days  Attendees: Patient: [name]  07/06/2016  11:04 AM  Physician: Radene JourneyAndrea Hernandez 07/06/2016  11:04 AM  Nursing: Leonia ReaderPhyllis Cobb 07/06/2016  11:04 AM  RN Care Manager: 07/06/2016  11:04 AM  Social Worker: [name], MSW, LCSW-A 07/06/2016  11:04 AM  Recreational Therapist: Hershal CoriaBeth Greene 07/06/2016  11:04 AM  Other:  07/06/2016  11:04 AM  Other:  07/06/2016  11:04 AM  Other: 07/06/2016  11:04 AM    Scribe for Treatment Team: [name], MSW, LCSW-A

## 2016-07-07 NOTE — ED Notes (Signed)
Patient was given a snack and a drink. 

## 2016-07-07 NOTE — ED Notes (Addendum)
Pt eating snack given - sandwich and Ginger Ale. Lunch order request received from pt. Pt signed Medical Clearance pt policy form - verbalized understanding. Copy given to pt and copy placed on clipboard.

## 2016-07-07 NOTE — ED Notes (Signed)
Pt lying on bed watching tv. 

## 2016-07-07 NOTE — ED Notes (Signed)
Ordered breakfast tray at 0241-TY

## 2016-07-07 NOTE — ED Notes (Signed)
Pt resting quietly on bed w/eyes closed. Respirations even, unlabored. 

## 2016-07-07 NOTE — ED Notes (Addendum)
Pt ate 100% of snack given. Called RN to room requesting Zofran for nausea. States he is withdrawing from heroin. Pt appears calm, cooperative, no sweating, no c/o aching/pain noted. No restlessness or irritability noted.

## 2016-07-07 NOTE — ED Notes (Signed)
Snack provided by EMT 

## 2016-07-08 NOTE — ED Notes (Signed)
TTS being performed.  

## 2016-07-08 NOTE — ED Notes (Addendum)
Sitting on bed watching tv and eating snack.

## 2016-07-08 NOTE — [facility] Counselor (Signed)
Reassessment of Pt.  Pt adamant that he will kill himself if released.  Reported continued SI.

## 2016-07-08 NOTE — ED Notes (Addendum)
Pt yelling while watching football game on tv. Sitter asked pt to lower his voice. Pt complied.

## 2016-07-08 NOTE — ED Notes (Signed)
Pt given ginger ale & jello for snack time.

## 2016-07-08 NOTE — ED Notes (Signed)
Pt given snack, and dinner order placed

## 2016-07-08 NOTE — ED Notes (Signed)
Pt eating snack w/o difficulty.

## 2016-07-08 NOTE — ED Notes (Signed)
Pt stood in doorway briefly then returned to lying on bed watching tv.

## 2016-07-09 ENCOUNTER — Observation Stay (HOSPITAL_COMMUNITY)
Admission: AD | Admit: 2016-07-09 | Discharge: 2016-07-10 | Disposition: A | Discharge status: Home / Self Care | Payer: Medicaid Other | Source: Intra-hospital | Attending: Psychiatry | Admitting: Psychiatry

## 2016-07-09 ENCOUNTER — Encounter (HOSPITAL_COMMUNITY): Payer: Self-pay | Admitting: *Deleted

## 2016-07-09 DIAGNOSIS — Z765 Malingerer [conscious simulation]: Secondary | ICD-10-CM | POA: Insufficient documentation

## 2016-07-09 DIAGNOSIS — Z590 Homelessness: Secondary | ICD-10-CM | POA: Insufficient documentation

## 2016-07-09 DIAGNOSIS — F1220 Cannabis dependence, uncomplicated: Secondary | ICD-10-CM | POA: Insufficient documentation

## 2016-07-09 DIAGNOSIS — Z9114 Patient's other noncompliance with medication regimen: Secondary | ICD-10-CM | POA: Insufficient documentation

## 2016-07-09 DIAGNOSIS — R45851 Suicidal ideations: Secondary | ICD-10-CM | POA: Insufficient documentation

## 2016-07-09 DIAGNOSIS — F1410 Cocaine abuse, uncomplicated: Secondary | ICD-10-CM | POA: Insufficient documentation

## 2016-07-09 DIAGNOSIS — Z91410 Personal history of adult physical and sexual abuse: Secondary | ICD-10-CM | POA: Insufficient documentation

## 2016-07-09 DIAGNOSIS — G4700 Insomnia, unspecified: Secondary | ICD-10-CM | POA: Insufficient documentation

## 2016-07-09 DIAGNOSIS — F333 Major depressive disorder, recurrent, severe with psychotic symptoms: Principal | ICD-10-CM | POA: Insufficient documentation

## 2016-07-09 DIAGNOSIS — F17210 Nicotine dependence, cigarettes, uncomplicated: Secondary | ICD-10-CM | POA: Insufficient documentation

## 2016-07-09 DIAGNOSIS — F4310 Post-traumatic stress disorder, unspecified: Secondary | ICD-10-CM | POA: Insufficient documentation

## 2016-07-09 DIAGNOSIS — Z9119 Patient's noncompliance with other medical treatment and regimen: Secondary | ICD-10-CM | POA: Insufficient documentation

## 2016-07-09 MED ORDER — QUETIAPINE FUMARATE 100 MG PO TABS
100.0000 mg | ORAL_TABLET | Freq: Every day | ORAL | Status: DC
  Administered 2016-07-09: 100 mg via ORAL
  Filled 2016-07-09: qty 1

## 2016-07-09 MED ORDER — ENSURE ENLIVE PO LIQD
237.0000 mL | Freq: Two times a day (BID) | ORAL | Status: DC
  Administered 2016-07-10: 237 mL via ORAL

## 2016-07-09 MED ORDER — TRAZODONE HCL 100 MG PO TABS
100.0000 mg | ORAL_TABLET | Freq: Every evening | ORAL | Status: DC | PRN
  Administered 2016-07-09: 100 mg via ORAL
  Filled 2016-07-09: qty 1

## 2016-07-09 MED ORDER — FLUOXETINE HCL 20 MG PO CAPS
20.0000 mg | ORAL_CAPSULE | Freq: Every day | ORAL | Status: DC
  Administered 2016-07-10: 20 mg via ORAL
  Filled 2016-07-09: qty 1

## 2016-07-09 MED ORDER — ALUM & MAG HYDROXIDE-SIMETH 200-200-20 MG/5ML PO SUSP: 30.0000 mL | ORAL | Status: DC | PRN

## 2016-07-09 MED ORDER — QUETIAPINE FUMARATE 25 MG PO TABS
25.0000 mg | ORAL_TABLET | Freq: Two times a day (BID) | ORAL | Status: DC
  Administered 2016-07-09 – 2016-07-10 (×2): 25 mg via ORAL
  Filled 2016-07-09 (×2): qty 1

## 2016-07-09 MED ORDER — MAGNESIUM HYDROXIDE 400 MG/5ML PO SUSP: 30.0000 mL | Freq: Every day | ORAL | Status: DC | PRN

## 2016-07-09 MED ORDER — TRAZODONE HCL 100 MG PO TABS: 100.0000 mg | ORAL_TABLET | Freq: Every day | ORAL | Status: DC

## 2016-07-09 MED ORDER — INFLUENZA VAC SPLIT QUAD 0.5 ML IM SUSY
0.5000 mL | PREFILLED_SYRINGE | INTRAMUSCULAR | Status: AC
  Administered 2016-07-10: 0.5 mL via INTRAMUSCULAR
  Filled 2016-07-09: qty 0.5

## 2016-07-09 MED ORDER — NICOTINE 21 MG/24HR TD PT24
21.0000 mg | MEDICATED_PATCH | Freq: Every day | TRANSDERMAL | Status: DC
  Administered 2016-07-09 – 2016-07-10 (×2): 21 mg via TRANSDERMAL
  Filled 2016-07-09 (×2): qty 1

## 2016-07-09 MED ORDER — ACETAMINOPHEN 325 MG PO TABS
650.0000 mg | ORAL_TABLET | Freq: Four times a day (QID) | ORAL | Status: DC | PRN
Start: 2016-07-09 — End: 2016-07-10
  Administered 2016-07-09: 650 mg via ORAL
  Filled 2016-07-09: qty 2
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
## 2016-07-09 NOTE — Progress Notes (Signed)
D: Patient sleeping at the time of shift change. Woke up requesting snacks. Patient complained of depression stated " I'm still not feeling good. Depression, am sad, bad feelings". Patient denies pain, SI, AH/HVH at this time. No behavioral issues noted. Accepted his meds.  A: Staff offered support and encouragement. Due meds given as ordered. Every 15 minutes check for safety maintained. Will continue to monitor for safety and stability.  R: Patient remains safe. Sleeping at this time.
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
## 2016-07-09 NOTE — ED Provider Notes (Signed)
I was informed by nursing staff that patient had [facility] by Dr. Lucianne MussKumar for further psychiatric evaluation and care.  They were previously medically cleared by Dr. Rhunette CroftNanavati .  On my assessment patient appeared to be medically stable. Their affect was quiet and they were hopeful about pending transfer.  VS were:   Vitals:   07/09/16 0642 07/09/16 1202  BP: 127/67 143/71  Pulse: 81 84  Resp: 16 20  Temp: 97.8 F (36.6 C) 97.9 F (36.6 C)    I explained risks/benefits of transfer and patient understood.     Marily MemosJason Charmain Diosdado, MD 07/09/16 [phone]

## 2016-07-09 NOTE — Progress Notes (Signed)
Patient stated that he believes that going back on Seroquil will help with his Si.  He also stated that [facility]. This Marketing executivewriter printed out names of homeless shelters in the [place] area as well as the phone numbers for the patient to call [facility] in the morning.
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
## 2016-07-09 NOTE — ED Notes (Signed)
Meal given

## 2016-07-09 NOTE — ED Notes (Signed)
Patient was given a  Ginger Ale, and a regular diet was ordered for lunch.

## 2016-07-09 NOTE — ED Notes (Signed)
Patient alert and oriented at discharge.  [facility].

## 2016-07-09 NOTE — ED Notes (Signed)
TTS at bedside with patient.   

## 2016-07-10 DIAGNOSIS — F333 Major depressive disorder, recurrent, severe with psychotic symptoms: Principal | ICD-10-CM

## 2016-07-10 MED ORDER — QUETIAPINE FUMARATE 100 MG PO TABS: 100.0000 mg | ORAL_TABLET | Freq: Every day | ORAL | 0 refills | Status: AC

## 2016-07-10 MED ORDER — FLUOXETINE HCL 20 MG PO CAPS: 20.0000 mg | ORAL_CAPSULE | Freq: Every day | ORAL | 0 refills | Status: AC

## 2016-07-10 MED ORDER — TRAZODONE HCL 100 MG PO TABS: 100.0000 mg | ORAL_TABLET | Freq: Every evening | ORAL | 0 refills | Status: AC | PRN

## 2016-07-10 MED ORDER — QUETIAPINE FUMARATE 25 MG PO TABS: 25.0000 mg | ORAL_TABLET | Freq: Two times a day (BID) | ORAL | 0 refills | Status: AC

## 2016-07-10 NOTE — Progress Notes (Signed)
D:  Patient cooperative; he reports passive suicidal ideation;  Verbally contracts for safety; he denies homicidal ideation and AVH;  No self-injurious behavior noted or reported. A:  Medications given per order.  Emotional support provided; encouraged him to seek assistance with needs/concerns. R:  Safety maintained on unit.

## 2016-07-10 NOTE — Discharge Planning (Signed)
[facility] DISCHARGE INSTRUCTIONS  Follow-up recommendations:  Activity:  As tolerated  Comments:  Patient given bus passes and train ticket to [facility]  The patient received suicide prevention pamphlet:  Yes Belongings returned:  Civil Service fast streamerClothing and Valuables  Camelia EngKaren H Gianfranco Araki 07/10/2016, 11:03 AM

## 2016-07-10 NOTE — [facility] Counselor (Signed)
This Clinical research associatewriter will provide this pt with a bus pass to the [employer] [employer] along with a one way [employer] ticket to [place] for this Clinical research associatewriter to go [facility]. The pt will be discharged after 11:30am on today.

## 2016-07-10 NOTE — Progress Notes (Signed)
[facility] INPATIENT:  Family/Significant Other Suicide Prevention Education  Suicide Prevention Education:  Patient reports that he is homeless and has nobody to notify.  He is to [facility].  Bus passes and train ticket given. He denies suicidal and homicidal ideation and AVH at present time.Camelia Eng.  Marton Malizia H Adiba Fargnoli 07/10/2016, 11:05 AM

## 2016-07-10 NOTE — [facility] Counselor (Signed)
This pt will [facility] on today's date and [place]. Pt is expected to provided the shelter with a valid ID, along wtih other pertinent information post arriving.
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
## 2016-07-10 NOTE — Discharge Planning (Signed)
Written/verbal discharge instructions, prescriptions  and follow-up information given to patient with verbalization of understanding;  Patient denies suicidal and homicidal ideation. Suicide Prevention information/materials given to patient  All patient belongings returned to patient at time of discharge. Discharged in stable condition. Patient given bus pass and train ticket to go to [facility].

## 2016-07-10 NOTE — [facility] Counselor (Signed)
This Probation officer made telephone call to [employer] as pt requested to see if their were any available beds. This Probation officer was informed that the shelter has several beds available and that this pt can come as early as today.This Probation officer spoke with Venora Maples who went over the admission requirements and wanted to make sure that the pt met all required criteria. This Probation officer informed Venora Maples that pt meets all requirements and they should expect the pt later this afternoon, based on the next available train schedule.

## 2018-01-12 IMAGING — DX DG CHEST 1V
1 series · 2 of 2 positions shown · non-contrast
Comparison: 05/07/2013

CLINICAL DATA: History of tobacco use, check for possible
tuberculosis

EXAM:
CHEST 1 VIEW

[Series 1: chest ap · 0.14mm/px · 2 of 2 slices shown]
[im 1/2]
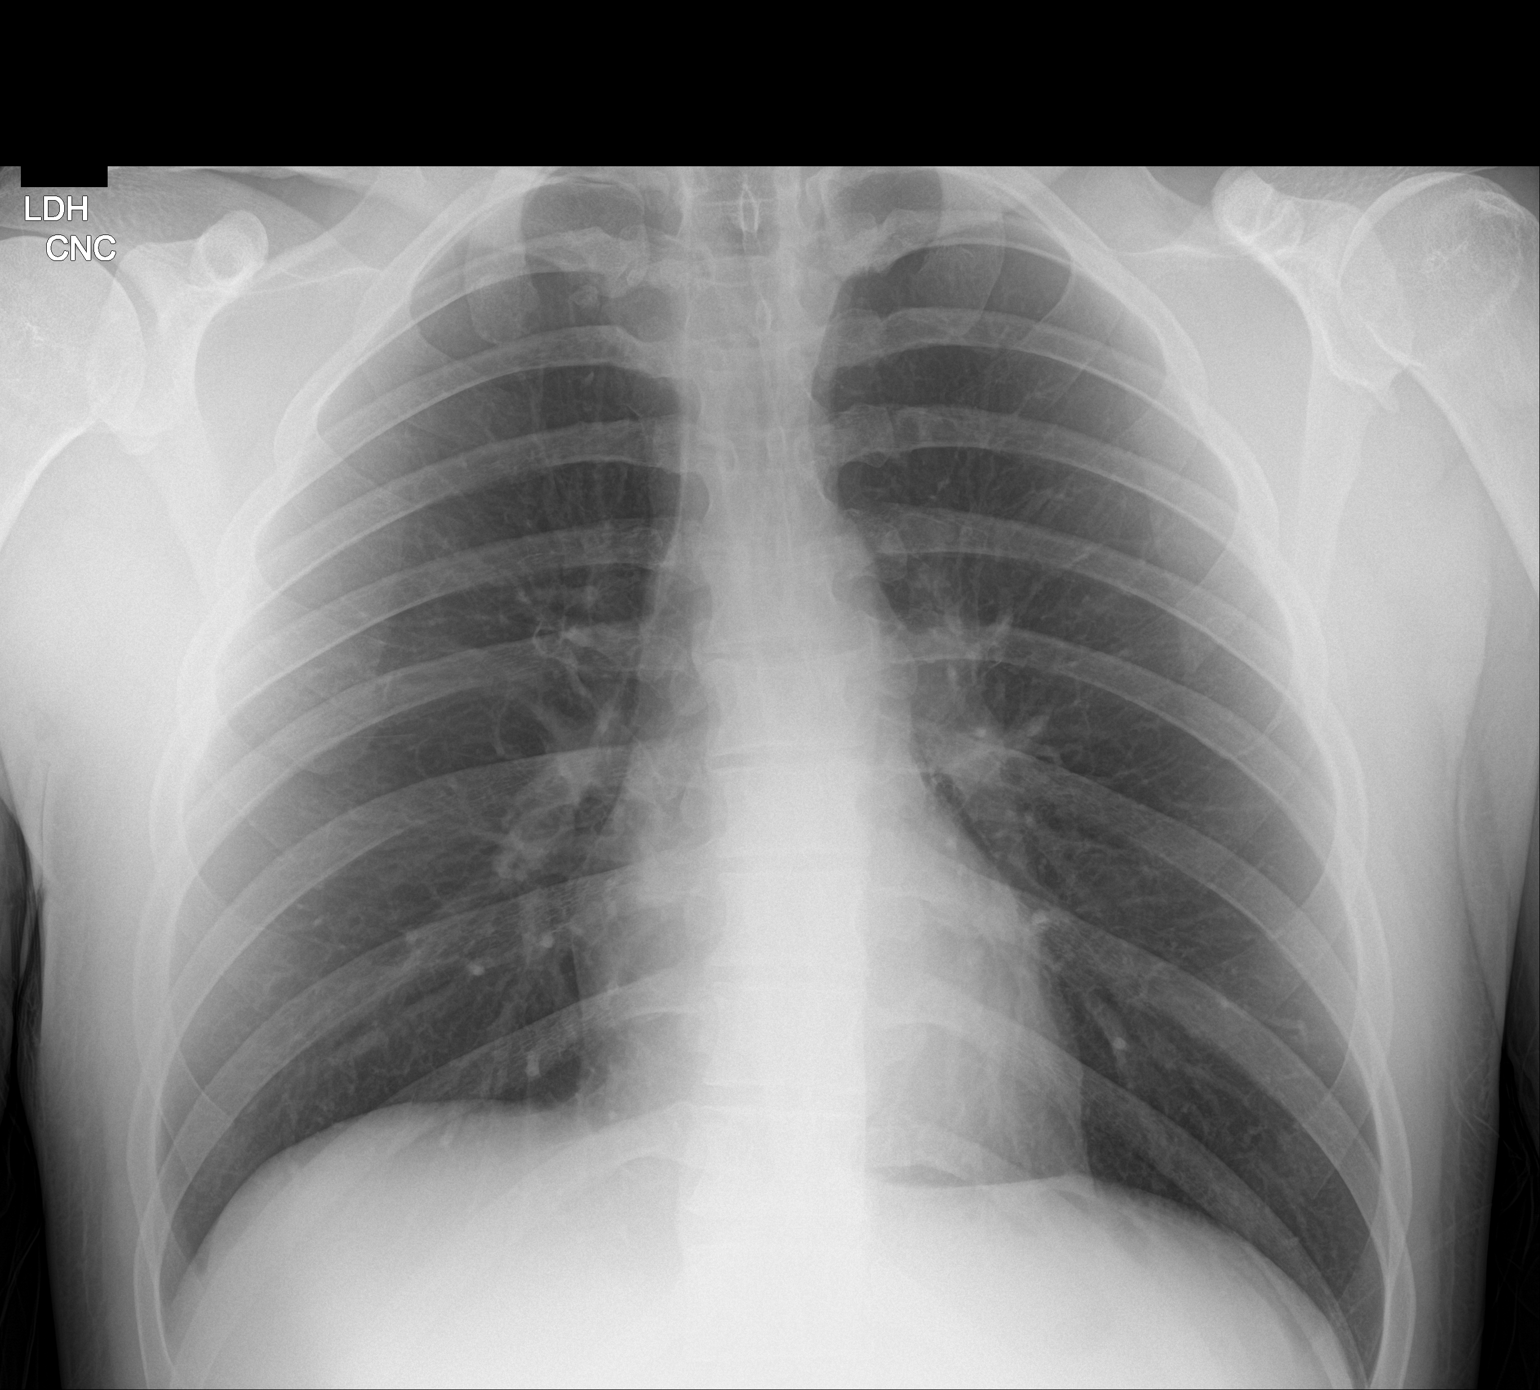
[im 2/2]
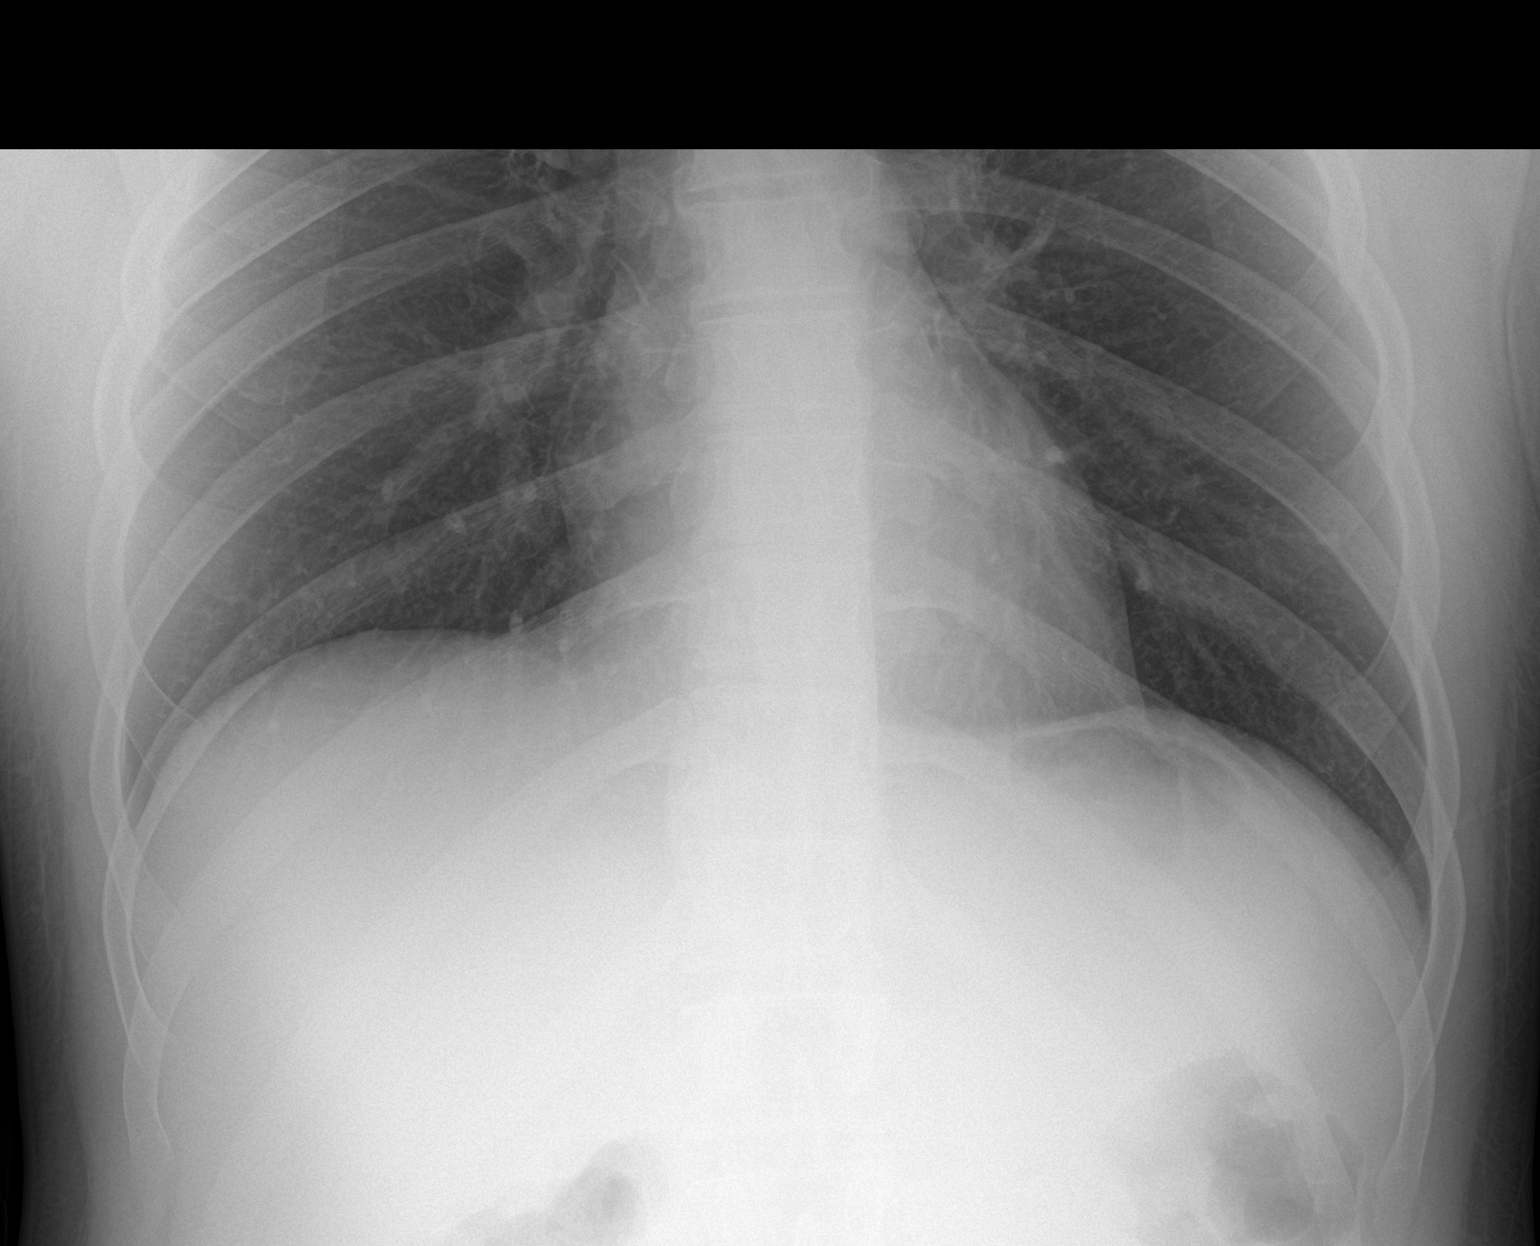

[2 of 2 positions shown; findings below may reference images not displayed]

FINDINGS: Cardiac shadow is within normal limits. The lungs are clear. No
signs of active tuberculosis are noted. No bony abnormality is seen.
IMPRESSION: No acute abnormality noted.
